# Patient Record
Sex: Female | Born: 1985 | Race: Black or African American | Hispanic: No | Marital: Married | Smoking: Never smoker
Health system: Southern US, Community
[De-identification: ages and names within clinical notes are randomized; demographics above are authoritative.]

## PROBLEM LIST (undated history)

## (undated) DIAGNOSIS — B999 Unspecified infectious disease: Secondary | ICD-10-CM

## (undated) DIAGNOSIS — A749 Chlamydial infection, unspecified: Secondary | ICD-10-CM

## (undated) DIAGNOSIS — D649 Anemia, unspecified: Secondary | ICD-10-CM

## (undated) DIAGNOSIS — L709 Acne, unspecified: Secondary | ICD-10-CM

## (undated) DIAGNOSIS — B009 Herpesviral infection, unspecified: Secondary | ICD-10-CM

## (undated) HISTORY — PX: DILATION AND CURETTAGE OF UTERUS: SHX78

---
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
## 2005-01-04 ENCOUNTER — Other Ambulatory Visit: Admission: RE | Admit: 2005-01-04 | Discharge: 2005-01-04 | Payer: Self-pay | Admitting: Obstetrics and Gynecology

## 2005-06-15 ENCOUNTER — Inpatient Hospital Stay (HOSPITAL_COMMUNITY): Admission: AD | Admit: 2005-06-15 | Discharge: 2005-06-16 | Payer: Self-pay | Admitting: Obstetrics and Gynecology

## 2005-06-16 ENCOUNTER — Inpatient Hospital Stay (HOSPITAL_COMMUNITY): Admission: AD | Admit: 2005-06-16 | Discharge: 2005-06-16 | Payer: Self-pay | Admitting: Obstetrics and Gynecology

## 2005-08-12 ENCOUNTER — Encounter (INDEPENDENT_AMBULATORY_CARE_PROVIDER_SITE_OTHER): Payer: Self-pay | Admitting: *Deleted

## 2005-08-12 ENCOUNTER — Inpatient Hospital Stay (HOSPITAL_COMMUNITY): Admission: AD | Admit: 2005-08-12 | Discharge: 2005-08-15 | Payer: Self-pay | Admitting: Obstetrics and Gynecology

## 2005-09-09 ENCOUNTER — Other Ambulatory Visit: Admission: RE | Admit: 2005-09-09 | Discharge: 2005-09-09 | Payer: Self-pay | Admitting: Obstetrics and Gynecology

## 2006-11-30 ENCOUNTER — Inpatient Hospital Stay (HOSPITAL_COMMUNITY): Admission: AD | Admit: 2006-11-30 | Discharge: 2006-11-30 | Payer: Self-pay | Admitting: Family Medicine

## 2006-12-16 ENCOUNTER — Ambulatory Visit (HOSPITAL_COMMUNITY): Admission: RE | Admit: 2006-12-16 | Discharge: 2006-12-16 | Payer: Self-pay | Admitting: Obstetrics

## 2007-02-20 ENCOUNTER — Inpatient Hospital Stay (HOSPITAL_COMMUNITY): Admission: AD | Admit: 2007-02-20 | Discharge: 2007-02-20 | Payer: Self-pay | Admitting: Obstetrics

## 2007-04-12 DIAGNOSIS — J45909 Unspecified asthma, uncomplicated: Secondary | ICD-10-CM

## 2007-04-24 ENCOUNTER — Inpatient Hospital Stay (HOSPITAL_COMMUNITY): Admission: AD | Admit: 2007-04-24 | Discharge: 2007-04-25 | Payer: Self-pay | Admitting: Obstetrics

## 2007-05-13 ENCOUNTER — Inpatient Hospital Stay (HOSPITAL_COMMUNITY): Admission: AD | Admit: 2007-05-13 | Discharge: 2007-05-13 | Payer: Self-pay | Admitting: Obstetrics & Gynecology

## 2007-05-16 ENCOUNTER — Inpatient Hospital Stay (HOSPITAL_COMMUNITY): Admission: AD | Admit: 2007-05-16 | Discharge: 2007-05-20 | Payer: Self-pay | Admitting: Obstetrics

## 2007-05-17 ENCOUNTER — Encounter: Payer: Self-pay | Admitting: Obstetrics

## 2007-11-06 ENCOUNTER — Emergency Department (HOSPITAL_COMMUNITY): Admission: EM | Admit: 2007-11-06 | Discharge: 2007-11-07 | Payer: Self-pay | Admitting: Emergency Medicine

## 2008-08-03 ENCOUNTER — Inpatient Hospital Stay (HOSPITAL_COMMUNITY): Admission: AD | Admit: 2008-08-03 | Discharge: 2008-08-03 | Payer: Self-pay | Admitting: Obstetrics & Gynecology

## 2009-03-06 ENCOUNTER — Emergency Department (HOSPITAL_COMMUNITY): Admission: EM | Admit: 2009-03-06 | Discharge: 2009-03-06 | Payer: Self-pay | Admitting: Emergency Medicine

## 2009-12-13 ENCOUNTER — Emergency Department (HOSPITAL_COMMUNITY): Admission: EM | Admit: 2009-12-13 | Discharge: 2009-12-14 | Payer: Self-pay | Admitting: Emergency Medicine

## 2009-12-15 ENCOUNTER — Emergency Department (HOSPITAL_COMMUNITY): Admission: EM | Admit: 2009-12-15 | Discharge: 2009-12-15 | Payer: Self-pay | Admitting: Family Medicine

## 2009-12-16 ENCOUNTER — Emergency Department (HOSPITAL_COMMUNITY): Admission: EM | Admit: 2009-12-16 | Discharge: 2009-12-16 | Payer: Self-pay | Admitting: Family Medicine

## 2009-12-17 ENCOUNTER — Emergency Department (HOSPITAL_COMMUNITY): Admission: EM | Admit: 2009-12-17 | Discharge: 2009-12-17 | Payer: Self-pay | Admitting: Family Medicine

## 2009-12-18 ENCOUNTER — Encounter (HOSPITAL_BASED_OUTPATIENT_CLINIC_OR_DEPARTMENT_OTHER): Admission: RE | Admit: 2009-12-18 | Discharge: 2010-02-04 | Payer: Self-pay | Admitting: Internal Medicine

## 2010-03-09 ENCOUNTER — Inpatient Hospital Stay (HOSPITAL_COMMUNITY): Admission: AD | Admit: 2010-03-09 | Discharge: 2010-03-09 | Payer: Self-pay | Admitting: Obstetrics & Gynecology

## 2010-06-05 ENCOUNTER — Inpatient Hospital Stay (HOSPITAL_COMMUNITY)
Admission: AD | Admit: 2010-06-05 | Discharge: 2010-06-05 | Payer: Self-pay | Source: Home / Self Care | Admitting: Obstetrics

## 2010-07-20 ENCOUNTER — Inpatient Hospital Stay (HOSPITAL_COMMUNITY)
Admission: AD | Admit: 2010-07-20 | Discharge: 2010-07-21 | Payer: Self-pay | Source: Home / Self Care | Attending: Obstetrics | Admitting: Obstetrics

## 2010-07-27 LAB — URINALYSIS, ROUTINE W REFLEX MICROSCOPIC
Bilirubin Urine: NEGATIVE
Hgb urine dipstick: NEGATIVE
Ketones, ur: >80 mg/dL — AB
Nitrite: NEGATIVE
Protein, ur: NEGATIVE mg/dL
Specific Gravity, Urine: 1.025 (ref 1.005–1.030)
Urine Glucose, Fasting: NEGATIVE mg/dL
Urobilinogen, UA: 0.2 mg/dL (ref 0.0–1.0)
pH: 6.5 (ref 5.0–8.0)

## 2010-09-22 LAB — URINALYSIS, ROUTINE W REFLEX MICROSCOPIC
Bilirubin Urine: NEGATIVE
Glucose, UA: NEGATIVE mg/dL
Hgb urine dipstick: NEGATIVE
Ketones, ur: NEGATIVE mg/dL
Nitrite: NEGATIVE
Protein, ur: NEGATIVE mg/dL
Specific Gravity, Urine: 1.01 (ref 1.005–1.030)
Urobilinogen, UA: 0.2 mg/dL (ref 0.0–1.0)
pH: 7.5 (ref 5.0–8.0)

## 2010-09-22 LAB — GC/CHLAMYDIA PROBE AMP, GENITAL
Chlamydia, DNA Probe: NEGATIVE
GC Probe Amp, Genital: NEGATIVE

## 2010-09-22 LAB — WET PREP, GENITAL
Trich, Wet Prep: NONE SEEN
Yeast Wet Prep HPF POC: NONE SEEN

## 2010-09-22 LAB — URINE MICROSCOPIC-ADD ON

## 2010-09-22 LAB — URINE CULTURE
Colony Count: 6000
Culture  Setup Time: 201111260116

## 2010-09-25 LAB — URINALYSIS, ROUTINE W REFLEX MICROSCOPIC
Bilirubin Urine: NEGATIVE
Glucose, UA: NEGATIVE mg/dL
Hgb urine dipstick: NEGATIVE
Ketones, ur: NEGATIVE mg/dL
Nitrite: NEGATIVE
Protein, ur: NEGATIVE mg/dL
Specific Gravity, Urine: 1.02 (ref 1.005–1.030)
Urobilinogen, UA: 0.2 mg/dL (ref 0.0–1.0)
pH: 7 (ref 5.0–8.0)

## 2010-09-25 LAB — WET PREP, GENITAL
Clue Cells Wet Prep HPF POC: NONE SEEN
Trich, Wet Prep: NONE SEEN

## 2010-09-25 LAB — GC/CHLAMYDIA PROBE AMP, GENITAL
Chlamydia, DNA Probe: NEGATIVE
GC Probe Amp, Genital: NEGATIVE

## 2010-09-25 LAB — CBC
HCT: 32 % — ABNORMAL LOW (ref 36.0–46.0)
Hemoglobin: 10.5 g/dL — ABNORMAL LOW (ref 12.0–15.0)
MCH: 27.5 pg (ref 26.0–34.0)
MCHC: 32.7 g/dL (ref 30.0–36.0)
MCV: 83.9 fL (ref 78.0–100.0)
Platelets: 251 10*3/uL (ref 150–400)
RBC: 3.81 MIL/uL — ABNORMAL LOW (ref 3.87–5.11)
RDW: 16.9 % — ABNORMAL HIGH (ref 11.5–15.5)
WBC: 5.9 10*3/uL (ref 4.0–10.5)

## 2010-09-25 LAB — HCG, QUANTITATIVE, PREGNANCY: hCG, Beta Chain, Quant, S: 2662 m[IU]/mL — ABNORMAL HIGH (ref <5)

## 2010-09-25 LAB — ABO/RH: ABO/RH(D): O POS

## 2010-09-25 LAB — POCT PREGNANCY, URINE: Preg Test, Ur: POSITIVE

## 2010-10-02 ENCOUNTER — Inpatient Hospital Stay (HOSPITAL_COMMUNITY)
Admission: AD | Admit: 2010-10-02 | Discharge: 2010-10-02 | Disposition: A | Discharge status: Home / Self Care | Payer: Medicaid Other | Source: Ambulatory Visit | Attending: Obstetrics | Admitting: Obstetrics

## 2010-10-02 DIAGNOSIS — Y92009 Unspecified place in unspecified non-institutional (private) residence as the place of occurrence of the external cause: Secondary | ICD-10-CM | POA: Insufficient documentation

## 2010-10-02 DIAGNOSIS — W010XXA Fall on same level from slipping, tripping and stumbling without subsequent striking against object, initial encounter: Secondary | ICD-10-CM | POA: Insufficient documentation

## 2010-10-02 DIAGNOSIS — O4700 False labor before 37 completed weeks of gestation, unspecified trimester: Secondary | ICD-10-CM | POA: Insufficient documentation

## 2010-10-17 LAB — CBC
HCT: 38.9 % (ref 36.0–46.0)
MCHC: 33.5 g/dL (ref 30.0–36.0)
MCV: 85.6 fL (ref 78.0–100.0)
Platelets: 245 10*3/uL (ref 150–400)
WBC: 4.5 10*3/uL (ref 4.0–10.5)

## 2010-10-17 LAB — POCT I-STAT, CHEM 8
Creatinine, Ser: 0.6 mg/dL (ref 0.4–1.2)
Glucose, Bld: 79 mg/dL (ref 70–99)
Hemoglobin: 14.3 g/dL (ref 12.0–15.0)
Sodium: 136 mEq/L (ref 135–145)
TCO2: 25 mmol/L (ref 0–100)

## 2010-10-17 LAB — DIFFERENTIAL
Eosinophils Absolute: 0 10*3/uL (ref 0.0–0.7)
Eosinophils Relative: 1 % (ref 0–5)
Lymphs Abs: 0.8 10*3/uL (ref 0.7–4.0)
Monocytes Relative: 8 % (ref 3–12)

## 2010-10-23 ENCOUNTER — Encounter (HOSPITAL_COMMUNITY)
Admission: RE | Admit: 2010-10-23 | Discharge: 2010-10-23 | Disposition: A | Discharge status: Home / Self Care | Payer: Medicaid Other | Source: Ambulatory Visit | Attending: Obstetrics | Admitting: Obstetrics

## 2010-10-23 LAB — CBC
HCT: 31.1 % — ABNORMAL LOW (ref 36.0–46.0)
MCHC: 30.2 g/dL (ref 30.0–36.0)
MCV: 80.6 fL (ref 78.0–100.0)
RDW: 20.2 % — ABNORMAL HIGH (ref 11.5–15.5)

## 2010-10-26 ENCOUNTER — Inpatient Hospital Stay (HOSPITAL_COMMUNITY)
Admission: RE | Admit: 2010-10-26 | Discharge: 2010-10-29 | DRG: 766 | Disposition: A | Discharge status: Home / Self Care | Payer: Medicaid Other | Source: Ambulatory Visit | Attending: Obstetrics | Admitting: Obstetrics

## 2010-10-26 ENCOUNTER — Other Ambulatory Visit: Payer: Self-pay | Admitting: Obstetrics

## 2010-10-26 DIAGNOSIS — Z01818 Encounter for other preprocedural examination: Secondary | ICD-10-CM

## 2010-10-26 DIAGNOSIS — Z01812 Encounter for preprocedural laboratory examination: Secondary | ICD-10-CM

## 2010-10-26 DIAGNOSIS — O34219 Maternal care for unspecified type scar from previous cesarean delivery: Principal | ICD-10-CM | POA: Diagnosis present

## 2010-10-26 DIAGNOSIS — O9903 Anemia complicating the puerperium: Secondary | ICD-10-CM | POA: Diagnosis not present

## 2010-10-26 DIAGNOSIS — D649 Anemia, unspecified: Secondary | ICD-10-CM | POA: Diagnosis not present

## 2010-10-26 LAB — HERPES SIMPLEX VIRUS CULTURE

## 2010-10-26 LAB — TYPE AND SCREEN

## 2010-10-26 LAB — URINALYSIS, ROUTINE W REFLEX MICROSCOPIC
Nitrite: NEGATIVE
Specific Gravity, Urine: 1.025 (ref 1.005–1.030)
Urobilinogen, UA: 0.2 mg/dL (ref 0.0–1.0)

## 2010-10-26 LAB — POCT PREGNANCY, URINE: Preg Test, Ur: NEGATIVE

## 2010-10-27 LAB — CBC
HCT: 25.6 % — ABNORMAL LOW (ref 36.0–46.0)
MCHC: 30.9 g/dL (ref 30.0–36.0)
MCV: 80.5 fL (ref 78.0–100.0)
RDW: 20.2 % — ABNORMAL HIGH (ref 11.5–15.5)
WBC: 7.1 10*3/uL (ref 4.0–10.5)

## 2010-10-29 NOTE — Op Note (Signed)
[name], [name]              ACCOUNT NO.:  [id number]  MEDICAL RECORD NO.:  [id number]           PATIENT TYPE:  I  LOCATION:  9127                          [facility]ILITY[facility]  [facility]  PHYSICIAN:  Charles A. Clearance Coots, M.D.DATE OF BIRTH:  [birth year]  DATE OF PROCEDURE:  10/26/2010 DATE OF DISCHARGE:                              OPERATIVE REPORT   PREOPERATIVE DIAGNOSIS:  Previous cesarean section, desire repeat cesarean section.  POSTOPERATIVE DIAGNOSIS:  Previous cesarean section, desire repeat cesarean section.  PROCEDURE:  Repeat low-transverse cesarean section.  SURGEON:  Charles A. Clearance Coots, MD  ASSISTANT:  Roseanna Rainbow, MD  ANESTHESIA:  Spinal.  ESTIMATED BLOOD LOSS:  600 mL.  IV FLUIDS:  2200 mL.  URINE OUTPUT:  250 mL clear.  COMPLICATIONS:  None.  Foley to gravity.  FINDINGS:  Viable female at 11:30, Apgars of 8 at one minute and 9 at five minutes, weight of 6 pounds and 13 ounces.  Normal uterus, ovaries,and fallopian tubes.  SPECIMENS:  Placenta.  DISPOSITION:  Specimen to Pathology.  OPERATION:  The patient was brought to the operating room and after satisfactory spinal anesthesia, the abdomen was prepped and draped in the usual sterile fashion.  A Pfannenstiel skin incision was made with a scalpel through the previous scar down to the fascia.  The fascia nicked in the midline.  The fascial incision was extended to left to right with curved Mayo scissors.  The superior and inferior fascial edges were taken off the rectus muscles both blunt and sharp dissection.  The rectus muscles bluntly divided in the midline and peritoneum was entered digitally and was digitally extended to left and to the right.  The Alexis retractor was placed in the incision.  The vesicouterine fold of peritoneum above the reflection of the urinary bladder was grasped with forceps and was incised and undermined with Metzenbaum scissors.  The incision was extended to left then to  the right with Metzenbaum scissors.  The bladder flap was bluntly developed.  The uterus was then entered transversely and the lower uterine segment with a scalpel. Clear amniotic fluid was expelled.  The uterine incision was extended to the left then to the right with bandage scissors.  The vertex was noted to be left occiput transverse.  Occiput was rotated anteriorly into the incision and the delivery of the vertex was accomplished with the aid of fundal pressure from the assistant.  The delivery of the shoulders and remainder of the body was completed with the aid of fundal pressure from the assistant.  The umbilical cord was doubly clamped and cut and the infant was handed off to the nursery staff.  Cord blood was obtained, the placenta was spontaneously expelled from the uterine cavity intact. The endometrial surface was thoroughly debrided with a dry lap sponge. The edges of the uterine incision were grasped with ring forceps. Uterus was closed with a continuous interlocking suture of the 0- Monocryl.  Hemostasis was excellent.  The pelvic cavity was then thoroughly irrigated with warm saline solution and all clots were removed.  The abdomen was then closed as follows.  The parietal peritoneum was  closed with continuous suture of 2-0 Monocryl.  The fascia was closed with continuous suture of 0-Vicryl subcutaneous tissue was thoroughly irrigated with warm saline solution and all areas of subcutaneous bleeding were coagulated with Bovie.  The skin was then closed with surgical stainless steel staples.  Sterile bandage was applied to the incision closure.  Surgical technician indicated that all needle, sponge, and instrument counts were correct x2.  The patient tolerated the procedure well, transported to recovery room in a satisfactory condition.     Charles A. Clearance Coots, M.D.     CAH/MEDQ  D:  10/26/2010  T:  10/27/2010  Job:  [id number]  Electronically Signed by Coral Ceo  M.D. on 10/29/2010 02:30:47 PM

## 2010-10-29 NOTE — Discharge Summary (Signed)
  [name], [name]              ACCOUNT NO.:  [id number]  MEDICAL RECORD NO.:  [id number]           PATIENT TYPE:  I  LOCATION:  9127                          [facility]  PHYSICIAN:  [name], M.D.DATE OF BIRTH:  1985/12/13  DATE OF ADMISSION:  10/26/2010 DATE OF DISCHARGE:                              DISCHARGE SUMMARY   ADMITTING DIAGNOSIS:  Previous cesarean section at term, desires repeat cesarean section.  DISCHARGE DIAGNOSIS:  Previous cesarean section at term, desires repeat cesarean section, status post repeat low-transverse cesarean section on October 26, 2010.  Viable female delivered at 11:30, Apgars of 8 at 1 minute and 9 at 5 minutes, weight of 6 pounds and 3 ounces.  Mother and infant discharged home in good condition.  REASON FOR ADMISSION:  A 25 year old G3 P2, estimated date of confinement on November 02, 2010, history of two previous cesarean section, desired repeat cesarean section.  PAST MEDICAL HISTORY:  Surgery, cesarean section x2.  ILLNESSES:  Anemia.  MEDICATIONS:  Prenatal vitamins.  ALLERGIES:  No known drug allergies.  SOCIAL HISTORY:  Single.  Negative tobacco, alcohol or recreational drug use.  FAMILY HISTORY:  Cervical cancer.  PHYSICAL EXAMINATION:  GENERAL:  Well-nourished and well-developed female in no acute distress. VITAL SIGNS:  Afebrile.  Vital signs are stable. LUNGS:  Clear to auscultation bilaterally. HEART:  Regular rate and rhythm. ABDOMEN:  Gravid, nontender.  Cervical exam omitted.  ADMITTING LABS:  Hemoglobin 9.4, hematocrit 31.1, white blood cell count 6400, platelets 258,000.  RPR is nonreactive.  HOSPITAL COURSE:  The patient underwent repeat low-transverse cesarean section on October 26, 2010.  There were no intraoperative complications. Postoperative course was uncomplicated.  The patient did have an anemia postoperatively, but she was hemodynamically stable and had no orthostatic changes and was able to  ambulate without dizziness, headaches or weakness.  She was therefore started on an iron therapy upon discharge.  The patient was discharged home on postop day #3 in good condition.  DISCHARGE LABS:  Hemoglobin 7.9, hematocrit 25.6, white blood cell count 7100, platelets 214,000.  DISCHARGE DISPOSITION:  Medications; continue prenatal vitamins, iron was prescribed for anemia, Percocet and ibuprofen was prescribed for pain.  Routine instructions were given for discharge after cesarean section.  The patient is to call our office for follow up appointment in 2 weeks.     Maevyn Riordan A. Clearance Willis, M.D.     CAH/MEDQ  D:  10/29/2010  T:  10/29/2010  Job:  [id number]  Electronically Signed by Coral Ceo M.D. on 10/29/2010 02:30:49 PM

## 2010-10-30 ENCOUNTER — Inpatient Hospital Stay (HOSPITAL_COMMUNITY)
Admission: AD | Admit: 2010-10-30 | Discharge: 2010-10-30 | Disposition: A | Discharge status: Home / Self Care | Payer: Medicaid Other | Source: Ambulatory Visit | Attending: Obstetrics & Gynecology | Admitting: Obstetrics & Gynecology

## 2010-10-30 DIAGNOSIS — O909 Complication of the puerperium, unspecified: Secondary | ICD-10-CM

## 2010-11-24 NOTE — Op Note (Signed)
[name], [name]              ACCOUNT NO.:  [id number]   MEDICAL RECORD NO.:  [id number]          PATIENT TYPE:  INP   LOCATION:  9119                          [facility]ILITY[facility]  [facility]   PHYSICIAN:  Charles A. Clearance Coots, M.D.DATE OF BIRTH:  04/17/1986   DATE OF PROCEDURE:  05/17/2007  DATE OF DISCHARGE:                               OPERATIVE REPORT   PREOPERATIVE DIAGNOSES:  1. 41 weeks' gestation.  2. Previous cesarean section.  3. Induction of labor.  4. Repetitive late onset variable decelerations.  5. Very unfavorable cervix.   POSTOPERATIVE DIAGNOSES:  1. 41 weeks' gestation.  2. Previous cesarean section.  3. Induction of labor.  4. Repetitive late onset variable decelerations.  5. Very unfavorable cervix.   PROCEDURE:  Repeat low transverse cesarean section.   SURGEON:  Charles A. Clearance Coots, M.D.   ANESTHESIA:  Spinal.   ESTIMATED BLOOD LOSS:  700 mL   COMPLICATIONS:  None.   DRAINS:  Foley to gravity.   FINDINGS:  Viable female at 0817h; Apgars at 8 at one minute and 9 at 5  minutes.  Weight 7 pounds 7 ounces.  Normal uterus, ovaries and  fallopian tubes.   OPERATION:  The patient was brought to the operating room and after  satisfactory spinal anesthesia, the abdomen was prepped and draped in  the usual sterile fashion.  A Pfannenstiel skin incision was made with  the scalpel and was deepened down to the fascia with a scalpel.  The  fascia was nicked in the midline and the fascial incision was extended  to left and to right with curved Mayo scissors.  The superior and  inferior fascial edges were taken off the rectus muscle both with blunt  and sharp dissection.  Rectus muscle was divided in the midline and the  peritoneum was entered digitally; was digitally extended to left and to  the right.  The bladder blade was positioned and the vesicouterine fold  of the peritoneum above the reflection of urinary bladder was grasped  with forceps; was then incised and  undermined with Metzenbaum scissors.  The incision was extended to the left and to right with Metzenbaum  scissors.  The bladder flap was bluntly developed and the bladder blade  was repositioned in front of the urinary bladder,  placing it well out  of the operative field.  The uterus was then entered transversely in the  lower uterine segment with the scalpel.  Clear amniotic fluid was  expelled.  The uterine incision was extended to the left and to the  right digitally.  The vertex was hyperextended, and it was difficult to  deliver with the aid of fundal pressure from the assistant.  The vacuum  extractor was placed on the occiput and the delivery was accomplished  with one pull of the vacuum extractor; the vertex was then delivered  quite easily.  The infant's mouth and nose were suctioned with a suction  bulb, and delivery was completed with the aid of fundal pressure from  the assistant.  The umbilical cord was doubly clamped and cut, and the  infant was handed  off to the nursery staff.  The placenta was then  manually removed from the uterus intact.  The endometrial surface was  then thoroughly debrided with a dry lap sponge.  Edges of the uterine  incision were grasped with the ring forceps.  The uterus was closed with  a continuous interlocking suture of 0 Monocryl, from each corner to the  center.  Hemostasis was excellent.  The pelvic cavity was then  thoroughly irrigated with warm saline solution.   The abdomen was then closed as follows.  The peritoneum was closed with  continuous suture of 2-0 Monocryl.  The fascia was closed with a  continuous suture of 0 Monocryl.  Subcutaneous tissue was thoroughly  irrigated with warm saline solution, and all areas of subcutaneous  bleeding were coagulated with the Bovie.  The skin was then closed with  continuous subcuticular suture of 3-0 Monocryl.  A sterile bandage was  applied to the incision closure.  The surgical technician  indicated that  all needle, sponge and instrument counts were correct x2.  The patient  tolerated the procedure well and was transported to the recovery room in  satisfactory condition.      Charles A. Clearance Coots, M.D.  Electronically Signed     CAH/MEDQ  D:  05/17/2007  T:  05/17/2007  Job:  [id number]

## 2010-11-27 NOTE — Discharge Summary (Signed)
[name], [name]              ACCOUNT NO.:  [id number]   MEDICAL RECORD NO.:  [id number]          PATIENT TYPE:  INP   LOCATION:  9124                          [facility]ILITY:  [facility]   PHYSICIAN:  James A. Ashley Royalty, M.D.DATE OF BIRTH:  1986/06/07   DATE OF ADMISSION:  08/12/2005  DATE OF DISCHARGE:  08/15/2005                                 DISCHARGE SUMMARY   DISCHARGE DIAGNOSES:  1.  Intrauterine pregnancy at [redacted] weeks gestation.  2.  Labor.  3.  Group B strep positive.  4.  Non reassuring fetal heart tracing, in labor.   OPERATIONS/PROCEDURES:  Primary low transverse cesarean section.   CONSULTATIONS:  None.   DISCHARGE MEDICATIONS:  Percocet, Motrin, vitamins.   HISTORY AND PHYSICAL EXAMINATION:  This is an 25 year old primigravida at 29-  weeks, 6-days gestation. Patient presented the day of admission complaining  of contractions. Fetal heart rate baseline was approximately 110 beats per  minute and maternity admissions. Initial cervical examination revealed the  cervix to be loose, finger tip dilatation, 80% effaced, -2 station, vertex  presentation. For the remainder of the history and physical please see  chart.   HOSPITAL COURSE:  The patient was admitted to [facility].  Admission laboratory studies were drawn. She was allowed to labor. On  August 12, 2005 at approximately 12:10 p.m. I was called to evaluate  possible FHR abnormalities. After thorough review the decision was made to  proceed with primary low transverse cesarean section for non reassuring  fetal heart tracing. The patient was taken to the operating room on August 12, 2005 and underwent primary low transverse cesarean section. The procedure  yielded a 6 pound, 13 ounce female with Apgar's 9 at 1 minute and 9 at 5  minutes. Transferred to the newborn nursery. Arterial cord pH was 7.30. The  patient's postpartum course was benign. She was discharged on the third  postpartum day afebrile  and in satisfactory condition.   DISPOSITION:  The patient is to return to [facility] in  4-6 weeks for postpartum evaluation.      James A. Ashley Royalty, M.D.  Electronically Signed     JAM/MEDQ  D:  09/08/2005  T:  09/08/2005  Job:  [id number]

## 2010-11-27 NOTE — Op Note (Signed)
[name], [name]              ACCOUNT NO.:  [id number]   MEDICAL RECORD NO.:  [id number]          PATIENT TYPE:  INP   LOCATION:  9124                          [facility]   PHYSICIAN:  James A. Ashley Royalty, M.D.DATE OF BIRTH:  Jul 24, 1985   DATE OF PROCEDURE:  08/12/2005  DATE OF DISCHARGE:                                 OPERATIVE REPORT   PREOPERATIVE DIAGNOSES:  1.  Intrauterine pregnancy at 41 weeks' gestation.  2.  Nonreassuring fetal heart tracing.   POSTOPERATIVE DIAGNOSES:  1.  Intrauterine pregnancy at 41 weeks' gestation.  2.  Nonreassuring fetal heart tracing.   PROCEDURE:  Primary low transverse cesarean section.   SURGEON:  Rudy Jew. Ashley Royalty, M.D.   ANESTHESIA:  Spinal.   FINDINGS:  A 6 pound 13 ounce female, Apgars 9 at one minute and 9 at five  minutes, sent to the newborn nursery.  Arterial cord pH 7.30.   ESTIMATED BLOOD LOSS:  800 mL.   COMPLICATIONS:  None immediate.   PACKS AND DRAINS:  Foley.   Sponge, needle and instrument count were reported as correct x2.   PROCEDURE:  The patient was taken to the operating room and placed in the  sitting position.  After a spinal anesthetic was administered, she was  placed in the dorsal supine position and prepped and draped in the usual  manner for abdominal surgery.  After she was prepped and Foley catheter  placed, the fetal scalp lead was removed and draping was completed.   A Pfannenstiel incision was made down to the level of the fascia, which was  nicked with a knife and incised transversely with Mayo scissors.  The  underlying rectus muscles were separated from the overlying fascia using  sharp and blunt dissection.  The peritoneum was elevated and entered  atraumatically with Metzenbaum scissors.  The incision was extended  longitudinally.  The uterus was identified and a bladder flap created by  incising the anterior uterine serosa and sharply and bluntly dissecting the  bladder inferiorly.  The  uterus was then entered through a low transverse  incision using sharp and blunt dissection.  The fluid was noted to be clear.  The infant was delivered from a vertex presentation in an atraumatic manner.  At delivery of the head, the oropharynx and nasal pharynx were suctioned.  Full delivery was accomplished.  The umbilical cord was triply clamped, cut,  and the infant given immediately to the awaiting pediatrics team.  Arterial  cord pH was obtained with an isolated segment of cord.  Then regular cord  blood was obtained.  The placenta and membranes were removed in their  entirety and submitted to pathology for histologic studies.  The uterus was  exteriorized.   The uterus was then closed in two running layers of #1 Vicryl.  The first  was a running, locking layer.  The second was a running, intermittently  locking, and imbricating layer.  One additional figure-of-eight suture was  required to obtain hemostasis.  Hemostasis was noted.  The uterus, tubes and  ovaries were inspected and found to be normal and  were returned to the  abdominal cavity.  Copious irrigation was accomplished.  Hemostasis was  noted.  The peritoneum was then closed with 3-0 Vicryl in a running fashion.  The fascia was closed 0 Vicryl in a running fashion.  The skin was closed  with staples.  The patient tolerated the procedure extremely well and was  returned to the recovery room in good condition.      James A. Ashley Royalty, M.D.  Electronically Signed     JAM/MEDQ  D:  08/12/2005  T:  08/12/2005  Job:  [id number]

## 2010-11-27 NOTE — Discharge Summary (Signed)
[name], [name]              ACCOUNT NO.:  [id number]   MEDICAL RECORD NO.:  [id number]          PATIENT TYPE:  INP   LOCATION:  9119                          [facility]   PHYSICIAN:  Charles A. Clearance Coots, M.D.DATE OF BIRTH:  1985-07-24   DATE OF ADMISSION:  05/16/2007  DATE OF DISCHARGE:  05/20/2007                               DISCHARGE SUMMARY   ADMISSION DIAGNOSES:  1. A [redacted] weeks gestation.  2. Induction of labor.  3. Previous cesarean section.  4. Unfavorable cervix.   DISCHARGE DIAGNOSES:  1. A [redacted] weeks gestation.  2. Induction of labor.  3. Previous cesarean section.  4. Unfavorable cervix.  5. Status post repeat low transverse cesarean section for      nonreassuring fetal heart rate with repetitive late onset of      variables, very unfavorable cervix.  Viable female was delivered by      repeat cesarean section on May 17, 2007 at 0817.  Apgars of 8      at 1 minute, 9 at 5 minutes, weight of 3395 grams, length of 51.5      cm.  Mother and infant discharged home in good condition.   REASON FOR ADMISSION:  A 25 year old G2, P50, estimated date of  confinement of May 07, 2007.  History of previous cesarean section  for nonreassuring fetal heart rate.  The patient desired trial of labor.  She was admitted to the hospital for ripening of the cervix with Foley  bulb because of the unfavorable nature of her cervix.   PAST MEDICAL HISTORY:  Surgery:  Cesarean section.  Illnesses:  None.   MEDICATIONS:  Prenatal vitamins.   ALLERGIES:  NO KNOWN DRUG ALLERGIES.   SOCIAL HISTORY:  Negative for tobacco, alcohol or recreational drug use.  Single, homemaker.   FAMILY HISTORY:  Positive for diabetes and cervical cancer.   PHYSICAL EXAMINATION:  GENERAL:  Well-nourished, well-developed female  in no acute distress.  VITAL SIGNS:  Stable.  She is afebrile.  LUNGS:  Clear to auscultation bilaterally.  HEART:  Regular rate and rhythm.  ABDOMEN:  Gravid,  nontender.  Cervix long, closed and vertex at -3  station.   LABORATORY DATA:  Hemoglobin 8.6, hematocrit 26.9, white blood cell  count 9400, platelets 332,000.  RPR was nonreactive.  Discharge  laboratory values:  Hemoglobin 7.8, hematocrit 24, white blood cell  count 8100, platelets 272,000.   HOSPITAL COURSE:  The patient was admitted and attempt was made to place  Foley bulb within the cervical canal for ripening of the cervix, but was  unsuccessful.  A decision was made to proceed with induction of labor.  Cervical ripening with low-dose Pitocin.  The patient after starting  Pitocin proceeded to have uterine contractions every 3-5 minutes with  late onset variable fetal heart rate decelerations.  Cervical exam was  essentially unchanged.  The late onset variable fetal heart rate  decelerations continued despite IV fluid hydration, position change and  oxygen.  The decision was made to proceed with repeat cesarean section  for nonreassuring fetal heart rate with a very unfavorable  cervix with  repetitive late onset variable fetal heart rate decelerations.  Repeat  low transverse cesarean section was performed on May 17, 2007.  There were no intraoperative complications.  Postop course was  uncomplicated.  The patient was discharged home on postop day 3 in good  condition.   The patient did have anemia on discharge, but her baseline hemoglobin  also indicated anemia with not a significant change in the hemoglobin of  only 1 gram after surgery.  The patient clinically and hemodynamically  is very stable with no orthostatic changes or headache, dizziness.  She  was therefore discharged home on iron therapy for her severe anemia.   DISCHARGE MEDICATIONS:  1. Continue prenatal vitamins.  2. Iron was prescribed for anemia.  3. Tylox and ibuprofen were prescribed for pain.   DISCHARGE INSTRUCTIONS:  1. Routine written instructions were given for discharge after      cesarean  section.  2. The patient is to call the office for a followup appointment in 2      weeks.      Charles A. Clearance Coots, M.D.  Electronically Signed     CAH/MEDQ  D:  06/16/2007  T:  06/17/2007  Job:  [id number]

## 2010-12-20 ENCOUNTER — Emergency Department (HOSPITAL_COMMUNITY)
Admission: EM | Admit: 2010-12-20 | Discharge: 2010-12-20 | Disposition: A | Discharge status: Home / Self Care | Payer: Medicaid Other | Attending: Emergency Medicine | Admitting: Emergency Medicine

## 2010-12-20 DIAGNOSIS — B86 Scabies: Secondary | ICD-10-CM | POA: Insufficient documentation

## 2010-12-20 DIAGNOSIS — L2989 Other pruritus: Secondary | ICD-10-CM | POA: Insufficient documentation

## 2010-12-20 DIAGNOSIS — R21 Rash and other nonspecific skin eruption: Secondary | ICD-10-CM | POA: Insufficient documentation

## 2010-12-20 DIAGNOSIS — L298 Other pruritus: Secondary | ICD-10-CM | POA: Insufficient documentation

## 2010-12-22 ENCOUNTER — Emergency Department (HOSPITAL_COMMUNITY)
Admission: EM | Admit: 2010-12-22 | Discharge: 2010-12-22 | Disposition: A | Discharge status: Home / Self Care | Payer: Medicaid Other | Attending: Emergency Medicine | Admitting: Emergency Medicine

## 2010-12-22 DIAGNOSIS — L509 Urticaria, unspecified: Secondary | ICD-10-CM | POA: Insufficient documentation

## 2010-12-22 DIAGNOSIS — L299 Pruritus, unspecified: Secondary | ICD-10-CM | POA: Insufficient documentation

## 2011-03-18 ENCOUNTER — Emergency Department (HOSPITAL_COMMUNITY)
Admission: EM | Admit: 2011-03-18 | Discharge: 2011-03-19 | Disposition: A | Discharge status: Home / Self Care | Payer: Medicaid Other | Attending: Emergency Medicine | Admitting: Emergency Medicine

## 2011-03-18 DIAGNOSIS — N760 Acute vaginitis: Secondary | ICD-10-CM | POA: Insufficient documentation

## 2011-03-18 DIAGNOSIS — R109 Unspecified abdominal pain: Secondary | ICD-10-CM | POA: Insufficient documentation

## 2011-03-18 DIAGNOSIS — B9689 Other specified bacterial agents as the cause of diseases classified elsewhere: Secondary | ICD-10-CM | POA: Insufficient documentation

## 2011-03-18 DIAGNOSIS — A499 Bacterial infection, unspecified: Secondary | ICD-10-CM | POA: Insufficient documentation

## 2011-03-18 DIAGNOSIS — R112 Nausea with vomiting, unspecified: Secondary | ICD-10-CM | POA: Insufficient documentation

## 2011-03-18 DIAGNOSIS — R10819 Abdominal tenderness, unspecified site: Secondary | ICD-10-CM | POA: Insufficient documentation

## 2011-03-18 LAB — DIFFERENTIAL
Eosinophils Absolute: 0.1 10*3/uL (ref 0.0–0.7)
Lymphocytes Relative: 27 % (ref 12–46)
Lymphs Abs: 1.8 10*3/uL (ref 0.7–4.0)
Monocytes Relative: 6 % (ref 3–12)
Neutro Abs: 4.4 10*3/uL (ref 1.7–7.7)
Neutrophils Relative %: 66 % (ref 43–77)

## 2011-03-18 LAB — CBC
HCT: 38.3 % (ref 36.0–46.0)
Hemoglobin: 12.7 g/dL (ref 12.0–15.0)
MCH: 27.4 pg (ref 26.0–34.0)
MCV: 82.7 fL (ref 78.0–100.0)
RBC: 4.63 MIL/uL (ref 3.87–5.11)

## 2011-03-18 LAB — COMPREHENSIVE METABOLIC PANEL
BUN: 8 mg/dL (ref 6–23)
CO2: 27 mEq/L (ref 19–32)
Calcium: 10.1 mg/dL (ref 8.4–10.5)
Chloride: 102 mEq/L (ref 96–112)
Creatinine, Ser: 0.63 mg/dL (ref 0.50–1.10)
GFR calc Af Amer: >60 mL/min (ref >60)
GFR calc non Af Amer: >60 mL/min (ref >60)
Glucose, Bld: 100 mg/dL — ABNORMAL HIGH (ref 70–99)
Total Bilirubin: 0.2 mg/dL — ABNORMAL LOW (ref 0.3–1.2)

## 2011-03-18 LAB — URINALYSIS, ROUTINE W REFLEX MICROSCOPIC
Glucose, UA: NEGATIVE mg/dL
Hgb urine dipstick: NEGATIVE
Protein, ur: NEGATIVE mg/dL
pH: 7 (ref 5.0–8.0)

## 2011-03-19 LAB — WET PREP, GENITAL
Trich, Wet Prep: NONE SEEN
Yeast Wet Prep HPF POC: NONE SEEN

## 2011-04-06 LAB — URINE MICROSCOPIC-ADD ON

## 2011-04-06 LAB — URINALYSIS, ROUTINE W REFLEX MICROSCOPIC
Bilirubin Urine: NEGATIVE
Glucose, UA: NEGATIVE
Hgb urine dipstick: NEGATIVE
Specific Gravity, Urine: 1.026
pH: 6

## 2011-04-20 LAB — CBC
Hemoglobin: 7.8 — CL
MCHC: 32.5
Platelets: 332
RBC: 3.16 — ABNORMAL LOW
RDW: 19.3 — ABNORMAL HIGH
WBC: 9.4

## 2011-04-20 LAB — RPR: RPR Ser Ql: NONREACTIVE

## 2011-04-20 LAB — CCBB MATERNAL DONOR DRAW

## 2011-04-26 LAB — CBC
HCT: 25.5 — ABNORMAL LOW
Hemoglobin: 8.6 — ABNORMAL LOW
Platelets: 229
RDW: 13.4
WBC: 11.8 — ABNORMAL HIGH

## 2011-04-26 LAB — COMPREHENSIVE METABOLIC PANEL
ALT: 8
AST: 14
Albumin: 2.4 — ABNORMAL LOW
Alkaline Phosphatase: 64
Chloride: 102
Potassium: 2.9 — ABNORMAL LOW
Sodium: 131 — ABNORMAL LOW
Total Bilirubin: 0.6
Total Protein: 5.9 — ABNORMAL LOW

## 2011-04-26 LAB — DIFFERENTIAL
Basophils Absolute: 0
Basophils Relative: 0
Eosinophils Absolute: 0
Eosinophils Relative: 0
Monocytes Absolute: 1.2 — ABNORMAL HIGH
Monocytes Relative: 10

## 2011-04-26 LAB — URINALYSIS, ROUTINE W REFLEX MICROSCOPIC
Glucose, UA: NEGATIVE
Ketones, ur: NEGATIVE
Nitrite: NEGATIVE
Protein, ur: NEGATIVE
pH: 6.5

## 2012-02-29 ENCOUNTER — Inpatient Hospital Stay (HOSPITAL_COMMUNITY)
Admission: AD | Admit: 2012-02-29 | Discharge: 2012-02-29 | Disposition: A | Discharge status: Home / Self Care | Payer: Medicaid Other | Source: Ambulatory Visit | Attending: Obstetrics | Admitting: Obstetrics

## 2012-02-29 ENCOUNTER — Encounter (HOSPITAL_COMMUNITY): Payer: Self-pay

## 2012-02-29 DIAGNOSIS — N949 Unspecified condition associated with female genital organs and menstrual cycle: Secondary | ICD-10-CM

## 2012-02-29 DIAGNOSIS — N898 Other specified noninflammatory disorders of vagina: Secondary | ICD-10-CM

## 2012-02-29 LAB — URINALYSIS, ROUTINE W REFLEX MICROSCOPIC
Bilirubin Urine: NEGATIVE
Ketones, ur: NEGATIVE mg/dL
Nitrite: NEGATIVE
Protein, ur: NEGATIVE mg/dL
Specific Gravity, Urine: 1.025 (ref 1.005–1.030)
Urobilinogen, UA: 0.2 mg/dL (ref 0.0–1.0)

## 2012-02-29 LAB — WET PREP, GENITAL: Yeast Wet Prep HPF POC: NONE SEEN

## 2012-02-29 LAB — URINE MICROSCOPIC-ADD ON

## 2012-02-29 NOTE — MAU Note (Signed)
Patient states 3 weeks ago she terminated a 7 week pregnancy in [place] with a D & E. Has been having cramping with vaginal discharge with an odor. Has pain in lower back on and off.

## 2012-02-29 NOTE — MAU Provider Note (Signed)
[name] [id number]  No chief complaint on file.    First Provider Initiated Contact with Patient 02/29/12 1659      SUBJECTIVE  HPI: 3 wks s/p elective Ab at [redacted] wk GA done in [place] on July 23. The D&E procedure was uncomplicated and she took antibiotics. Her bleeding stopped completely after 2 weeks. She is currently using the Nuva Ring due to come out in 5 days. She has been sexually active in the last week. She is now concerned that she has menstrual-like cramping and malodorous discharge.  Past Medical History  Diagnosis Date  . No pertinent past medical history    Past Surgical History  Procedure Date  . Cesarean section   . Dilation and curettage of uterus    History   Social History  . Marital Status: Single    Spouse Name: N/A    Number of Children: N/A  . Years of Education: N/A   Occupational History  . Not on file.   Social History Main Topics  . Smoking status: Never Smoker   . Smokeless tobacco: Not on file  . Alcohol Use:   . Drug Use: No  . Sexually Active: Yes    Birth Control/ Protection: Inserts     nuvaring   Other Topics Concern  . Not on file   Social History Narrative  . No narrative on file   No current facility-administered medications on file prior to encounter.   No current outpatient prescriptions on file prior to encounter.   No Known Allergies  ROS: Pertinent items in HPI  OBJECTIVE Blood pressure 118/82, pulse 72, temperature 98.2 F (36.8 C), temperature source Oral, resp. rate 16, height 5\' 4"  (1.626 m), weight 94.62 kg (208 lb 9.6 oz), last menstrual period 01/11/2012, SpO2 100.00%.  GENERAL: Well-developed, well-nourished female in no acute distress.  HEENT: Normocephalic, good dentition HEART: normal rate RESP: normal effort ABDOMEN: Soft, nontender EXTREMITIES: Nontender, no edema NEURO: Alert and oriented SPECULUM EXAM: NEFG, scant creamy white discharge, no blood noted, cervix clean, Nuva Ring in  place BIMANUAL: cervix clean, os closed, mobile; uterus NT andNSSP; no adnexal tenderness or masses   LAB RESULTS  Results for orders placed during the hospital encounter of 02/29/12 (from the past 24 hour(s))  URINALYSIS, ROUTINE W REFLEX MICROSCOPIC     Status: Abnormal   Collection Time   02/29/12  4:15 PM      Component Value Range   Color, Urine YELLOW  YELLOW   APPearance CLEAR  CLEAR   Specific Gravity, Urine 1.025  1.005 - 1.030   pH 6.0  5.0 - 8.0   Glucose, UA NEGATIVE  NEGATIVE mg/dL   Hgb urine dipstick TRACE (*) NEGATIVE   Bilirubin Urine NEGATIVE  NEGATIVE   Ketones, ur NEGATIVE  NEGATIVE mg/dL   Protein, ur NEGATIVE  NEGATIVE mg/dL   Urobilinogen, UA 0.2  0.0 - 1.0 mg/dL   Nitrite NEGATIVE  NEGATIVE   Leukocytes, UA NEGATIVE  NEGATIVE  URINE MICROSCOPIC-ADD ON     Status: Abnormal   Collection Time   02/29/12  4:15 PM      Component Value Range   Squamous Epithelial / LPF FEW (*) RARE   WBC, UA 0-2  <3 WBC/hpf   RBC / HPF 0-2  <3 RBC/hpf   Bacteria, UA FEW (*) RARE   Urine-Other MUCOUS PRESENT    POCT PREGNANCY, URINE     Status: Normal   Collection Time   02/29/12  5:06  PM      Component Value Range   Preg Test, Ur NEGATIVE  NEGATIVE  WET PREP, GENITAL     Status: Abnormal   Collection Time   02/29/12  5:15 PM      Component Value Range   Yeast Wet Prep HPF POC NONE SEEN  NONE SEEN   Trich, Wet Prep NONE SEEN  NONE SEEN   Clue Cells Wet Prep HPF POC NONE SEEN  NONE SEEN   WBC, Wet Prep HPF POC FEW (*) NONE SEEN       ASSESSMENT  1. Vaginal Discharge     PLAN  Medication List    Notice       You have not been prescribed any medications.           Reassured re negative WP GC/CT sent - will call and tx if positive Vaginitis prevention discussed       Davan Hark 02/29/2012 5:01 PM

## 2012-03-01 LAB — GC/CHLAMYDIA PROBE AMP, GENITAL
Chlamydia, DNA Probe: NEGATIVE
GC Probe Amp, Genital: NEGATIVE

## 2012-03-26 ENCOUNTER — Inpatient Hospital Stay (HOSPITAL_COMMUNITY)
Admission: AD | Admit: 2012-03-26 | Discharge: 2012-03-26 | Disposition: A | Discharge status: Home / Self Care | Payer: Medicaid Other | Source: Ambulatory Visit | Attending: Obstetrics | Admitting: Obstetrics

## 2012-03-26 ENCOUNTER — Encounter (HOSPITAL_COMMUNITY): Payer: Self-pay | Admitting: *Deleted

## 2012-03-26 DIAGNOSIS — Z3202 Encounter for pregnancy test, result negative: Secondary | ICD-10-CM

## 2012-03-26 DIAGNOSIS — R109 Unspecified abdominal pain: Secondary | ICD-10-CM | POA: Insufficient documentation

## 2012-03-26 HISTORY — DX: Anemia, unspecified: D64.9

## 2012-03-26 HISTORY — DX: Acne, unspecified: L70.9

## 2012-03-26 HISTORY — DX: Chlamydial infection, unspecified: A74.9

## 2012-03-26 LAB — URINALYSIS, ROUTINE W REFLEX MICROSCOPIC
Glucose, UA: NEGATIVE mg/dL
Ketones, ur: NEGATIVE mg/dL
Leukocytes, UA: NEGATIVE
pH: 6 (ref 5.0–8.0)

## 2012-03-26 LAB — URINE MICROSCOPIC-ADD ON

## 2012-03-26 LAB — POCT PREGNANCY, URINE: Preg Test, Ur: NEGATIVE

## 2012-03-26 NOTE — MAU Note (Signed)
Pt had termination of pregnancy in July. Came in in August for follow up reports everything was normal. Pt reprot for the past 2 days she has been experience abd pain/Fluttering every few minutes lasting all day long.  Denies vaginal bleeding or discharge at thisi time. Pt using Nuva ring for birht control and LMP 8/29?13

## 2012-03-26 NOTE — MAU Note (Signed)
Had termination in July, came back here the following month was feeling a throbbing or fluttery sensation- everything checked out, no retained POC, no sign of infection.   Same sensation started 3 days ago- accompanied with a sharp pain.  The sensation is in one specific place.  Last period was 08/29, is using Nuvaring. (3 wks on/1 wk off). abd is soft, tender to touch only in suprapubic area.

## 2012-03-26 NOTE — MAU Provider Note (Signed)
  History     CSN: [id number]  Arrival date and time: 03/26/12 9147   First Provider Initiated Contact with Patient 03/26/12 1045      Chief Complaint  Patient presents with  . Abdominal Pain   HPI [name] 26 y.o. Comes to MAU today with a feeling of "thumping" in her low midline abdomen.  Had termination of a pregnancy in July and is continuing to feel this sensation in her abdomen since the termination.  Was seen here on 02-29-12 and had a full pelvic exam.  Has been using the Nuvaring and has the same sex partner since testing was done in August.  Denies any pain today, but states when the thumping occurs, sometimes it is painful.  Reports a BM yesterday.    OB History    Grav Para Term Preterm Abortions TAB SAB Ect Mult Living   4 3 3  0 1 1 0 0 0 3      Past Medical History  Diagnosis Date  . Anemia   . Acne   . Chlamydia     Past Surgical History  Procedure Date  . Cesarean section   . Dilation and curettage of uterus     Family History  Problem Relation Age of Onset  . Adopted: Yes  . Other Neg Hx     History  Substance Use Topics  . Smoking status: Never Smoker   . Smokeless tobacco: Never Used  . Alcohol Use:     Allergies: No Known Allergies  Prescriptions prior to admission  Medication Sig Dispense Refill  . acetaminophen (TYLENOL) 325 MG tablet Take 650 mg by mouth daily as needed. For pain        Review of Systems  Gastrointestinal: Negative for nausea, vomiting and abdominal pain.  Genitourinary:       No vaginal discharge. No vaginal bleeding. No dysuria.   Physical Exam   Blood pressure 118/72, pulse 78, temperature 98.5 F (36.9 C), temperature source Oral, resp. rate 18, height 5\' 4"  (1.626 m), weight 93.26 kg (205 lb 9.6 oz), last menstrual period 03/09/2012.  Physical Exam  Nursing note and vitals reviewed. Constitutional: She is oriented to person, place, and time. She appears well-developed and well-nourished.  HENT:    Head: Normocephalic.  Eyes: EOM are normal.  Neck: Neck supple.  Musculoskeletal: Normal range of motion.  Neurological: She is alert and oriented to person, place, and time.  Skin: Skin is warm and dry.  Psychiatric: She has a normal mood and affect.    MAU Course  Procedures  MDM Discussed negative pregnancy test results.  Offered pelvic exam with STD cultures, but client declines.  Same partner as when testing was done on 02-29-12.  Wants the thumping to stop.  Advised that there is not a treatment I can give her today to stop this sensation.  Advised to follow up with Dr. Clearance Coots.  Assessment and Plan  Negative pregnancy test Sensation in lower abdomen  Plan Follow up with Dr. Clearance Coots Continue using the Nuvaring.  BURLESON,TERRI 03/26/2012, 10:47 AM

## 2012-06-18 ENCOUNTER — Emergency Department (HOSPITAL_COMMUNITY)
Admission: EM | Admit: 2012-06-18 | Discharge: 2012-06-18 | Disposition: A | Discharge status: Home / Self Care | Payer: Medicaid Other | Attending: Emergency Medicine | Admitting: Emergency Medicine

## 2012-06-18 ENCOUNTER — Encounter (HOSPITAL_COMMUNITY): Payer: Self-pay | Admitting: *Deleted

## 2012-06-18 DIAGNOSIS — Z8619 Personal history of other infectious and parasitic diseases: Secondary | ICD-10-CM | POA: Insufficient documentation

## 2012-06-18 DIAGNOSIS — R5381 Other malaise: Secondary | ICD-10-CM | POA: Insufficient documentation

## 2012-06-18 DIAGNOSIS — R42 Dizziness and giddiness: Secondary | ICD-10-CM | POA: Insufficient documentation

## 2012-06-18 DIAGNOSIS — Z3202 Encounter for pregnancy test, result negative: Secondary | ICD-10-CM | POA: Insufficient documentation

## 2012-06-18 DIAGNOSIS — R630 Anorexia: Secondary | ICD-10-CM | POA: Insufficient documentation

## 2012-06-18 DIAGNOSIS — R112 Nausea with vomiting, unspecified: Secondary | ICD-10-CM

## 2012-06-18 DIAGNOSIS — IMO0001 Reserved for inherently not codable concepts without codable children: Secondary | ICD-10-CM | POA: Insufficient documentation

## 2012-06-18 DIAGNOSIS — R5383 Other fatigue: Secondary | ICD-10-CM | POA: Insufficient documentation

## 2012-06-18 DIAGNOSIS — R197 Diarrhea, unspecified: Secondary | ICD-10-CM | POA: Insufficient documentation

## 2012-06-18 DIAGNOSIS — M2550 Pain in unspecified joint: Secondary | ICD-10-CM | POA: Insufficient documentation

## 2012-06-18 DIAGNOSIS — Z862 Personal history of diseases of the blood and blood-forming organs and certain disorders involving the immune mechanism: Secondary | ICD-10-CM | POA: Insufficient documentation

## 2012-06-18 LAB — CBC WITH DIFFERENTIAL/PLATELET
Basophils Absolute: 0 10*3/uL (ref 0.0–0.1)
Eosinophils Absolute: 0.1 10*3/uL (ref 0.0–0.7)
Eosinophils Relative: 1 % (ref 0–5)
HCT: 34.9 % — ABNORMAL LOW (ref 36.0–46.0)
Lymphocytes Relative: 23 % (ref 12–46)
MCH: 27 pg (ref 26.0–34.0)
MCHC: 33.2 g/dL (ref 30.0–36.0)
MCV: 81.4 fL (ref 78.0–100.0)
Monocytes Absolute: 0.4 10*3/uL (ref 0.1–1.0)
RDW: 15 % (ref 11.5–15.5)

## 2012-06-18 LAB — URINALYSIS, MICROSCOPIC ONLY
Glucose, UA: NEGATIVE mg/dL
Leukocytes, UA: NEGATIVE
Protein, ur: NEGATIVE mg/dL
Urobilinogen, UA: 1 mg/dL (ref 0.0–1.0)

## 2012-06-18 LAB — COMPREHENSIVE METABOLIC PANEL
AST: 8 U/L (ref 0–37)
CO2: 24 mEq/L (ref 19–32)
Calcium: 9.5 mg/dL (ref 8.4–10.5)
Creatinine, Ser: 0.71 mg/dL (ref 0.50–1.10)
GFR calc Af Amer: >90 mL/min (ref >90)
GFR calc non Af Amer: >90 mL/min (ref >90)

## 2012-06-18 MED ORDER — SODIUM CHLORIDE 0.9 % IV BOLUS (SEPSIS)
1000.0000 mL | Freq: Once | INTRAVENOUS | Status: AC
  Administered 2012-06-18: 1000 mL via INTRAVENOUS

## 2012-06-18 MED ORDER — ONDANSETRON HCL 4 MG/2ML IJ SOLN
4.0000 mg | Freq: Once | INTRAMUSCULAR | Status: AC
  Administered 2012-06-18: 4 mg via INTRAVENOUS
  Filled 2012-06-18: qty 2

## 2012-06-18 MED ORDER — ONDANSETRON HCL 4 MG PO TABS: 4.0000 mg | ORAL_TABLET | Freq: Four times a day (QID) | ORAL | Status: DC

## 2012-06-18 NOTE — ED Notes (Signed)
Pt presents to department for evaluation of N/V/D and lower abdominal cramping, ongoing x1 week. Denies urinary symptoms. Denies fever. Also states generalized weakness and fatigue. Denies pain at the time. No active vomiting. Pt is conscious alert and oriented x4.

## 2012-06-18 NOTE — ED Provider Notes (Signed)
History     CSN: [id number]  Arrival date & time 06/18/12  1026   First MD Initiated Contact with Patient 06/18/12 1131      Chief Complaint  Patient presents with  . Diarrhea  . Emesis    (Consider location/radiation/quality/duration/timing/severity/associated sxs/prior treatment) HPI Comments: Patient presents to ED with one week history of not feeling well with fatigue and nausea. She reports 2 episodes of vomiting in the past 24 hours and some diarrhea yesterday. Denies any fever, chills, shortness of breath or chest pain. She reports no appetite and feeling generally weak and lightheaded especially with standing. Reported to be negative Pregnancy test at home. Denies sick contacts or unusual foods. Denies dysuria, hematuria, vaginal bleeding or discharge.  The history is provided by the patient.    Past Medical History  Diagnosis Date  . Anemia   . Acne   . Chlamydia     Past Surgical History  Procedure Date  . Cesarean section   . Dilation and curettage of uterus     Family History  Problem Relation Age of Onset  . Adopted: Yes  . Other Neg Hx     History  Substance Use Topics  . Smoking status: Never Smoker   . Smokeless tobacco: Never Used  . Alcohol Use: No    OB History    Grav Para Term Preterm Abortions TAB SAB Ect Mult Living   4 3 3  0 1 1 0 0 0 3      Review of Systems  Constitutional: Positive for activity change, appetite change and fatigue. Negative for fever.  HENT: Negative for congestion.   Respiratory: Negative for cough, chest tightness and shortness of breath.   Cardiovascular: Negative for chest pain.  Gastrointestinal: Positive for nausea, vomiting and diarrhea. Negative for abdominal pain.  Genitourinary: Negative for dysuria, vaginal bleeding and vaginal discharge.  Musculoskeletal: Positive for myalgias and arthralgias.  Skin: Negative for rash.  Neurological: Positive for weakness. Negative for headaches.    Allergies   Review of patient's allergies indicates no known allergies.  Home Medications   Current Outpatient Rx  Name  Route  Sig  Dispense  Refill  . ACETAMINOPHEN 325 MG PO TABS   Oral   Take 650 mg by mouth daily as needed. For pain         . BISMUTH SUBSALICYLATE 262 MG PO CHEW   Oral   Chew 524 mg by mouth as needed. For upset stomach         . ETONOGESTREL-ETHINYL ESTRADIOL 0.12-0.015 MG/24HR VA RING   Vaginal   Place 1 each vaginally every 28 (twenty-eight) days. Insert vaginally and leave in place for 3 consecutive weeks, then remove for 1 week.         Marland Kitchen ONDANSETRON HCL 4 MG PO TABS   Oral   Take 1 tablet (4 mg total) by mouth every 6 (six) hours.   12 tablet   0     BP 124/102  Pulse 72  Temp 97.7 F (36.5 C) (Oral)  Resp 16  SpO2 100%  LMP 05/21/2012  Physical Exam  Constitutional: She is oriented to person, place, and time. She appears well-developed. No distress.  HENT:  Head: Normocephalic and atraumatic.  Mouth/Throat: Oropharynx is clear and moist. No oropharyngeal exudate.  Eyes: Conjunctivae normal and EOM are normal. Pupils are equal, round, and reactive to light.  Neck: Normal range of motion. Neck supple.       No meningismus  Cardiovascular: Normal rate, regular rhythm and normal heart sounds.   No murmur heard. Pulmonary/Chest: Effort normal and breath sounds normal. No respiratory distress.  Abdominal: Soft. There is no tenderness. There is no rebound and no guarding.  Musculoskeletal: Normal range of motion. She exhibits no edema and no tenderness.  Neurological: She is alert and oriented to person, place, and time. No cranial nerve deficit. She exhibits normal muscle tone. Coordination normal.  Skin: Skin is warm.    ED Course  Procedures (including critical care time)  Labs Reviewed  CBC WITH DIFFERENTIAL - Abnormal; Notable for the following:    Hemoglobin 11.6 (*)     HCT 34.9 (*)     All other components within normal limits   COMPREHENSIVE METABOLIC PANEL - Abnormal; Notable for the following:    Albumin 3.4 (*)     Total Bilirubin 0.2 (*)     All other components within normal limits  URINALYSIS, MICROSCOPIC ONLY - Abnormal; Notable for the following:    Hgb urine dipstick SMALL (*)     All other components within normal limits  LIPASE, BLOOD  RAPID STREP SCREEN  MONONUCLEOSIS SCREEN  POCT PREGNANCY, URINE   No results found.   1. Nausea and vomiting       MDM  Fatigue, generalized weakness, nausea, vomiting diarrhea. Abdomen soft and nontender. Vitals stable.  Hemoglobin stable. UA negative. HCG negative. Labs otherwise unremarkable.  Tolerating PO in the ED, no vomiting.  Abdomen soft and nontender.  Supportive care for viral syndrome. PCP followup.     Glynn Octave, MD 06/18/12 [phone]

## 2012-06-18 NOTE — ED Notes (Signed)
Pt reports not feeling well x 1 week. Having fatigue, diarrhea, vomiting and no appetite. Reports taking preg test at home and it was negative. lmp 11/10

## 2012-08-26 ENCOUNTER — Other Ambulatory Visit: Payer: Self-pay

## 2012-08-31 ENCOUNTER — Emergency Department (HOSPITAL_COMMUNITY)
Admission: EM | Admit: 2012-08-31 | Discharge: 2012-08-31 | Disposition: A | Discharge status: Home / Self Care | Payer: Medicaid Other | Attending: Emergency Medicine | Admitting: Emergency Medicine

## 2012-08-31 ENCOUNTER — Encounter (HOSPITAL_COMMUNITY): Payer: Self-pay | Admitting: Cardiology

## 2012-08-31 DIAGNOSIS — K120 Recurrent oral aphthae: Secondary | ICD-10-CM | POA: Insufficient documentation

## 2012-08-31 DIAGNOSIS — Z8619 Personal history of other infectious and parasitic diseases: Secondary | ICD-10-CM | POA: Insufficient documentation

## 2012-08-31 DIAGNOSIS — K1370 Unspecified lesions of oral mucosa: Secondary | ICD-10-CM | POA: Insufficient documentation

## 2012-08-31 DIAGNOSIS — Z862 Personal history of diseases of the blood and blood-forming organs and certain disorders involving the immune mechanism: Secondary | ICD-10-CM | POA: Insufficient documentation

## 2012-08-31 DIAGNOSIS — Z872 Personal history of diseases of the skin and subcutaneous tissue: Secondary | ICD-10-CM | POA: Insufficient documentation

## 2012-08-31 NOTE — ED Notes (Signed)
Pt reports she thinks she has an ulcer the in the left side of her mouth. Reports she tried to go to her dentist but was told they would not see her there.

## 2012-08-31 NOTE — ED Provider Notes (Signed)
History  This chart was scribed for non-physician practitioner working with Doug Sou, MD by Ardeen Jourdain, ED Scribe. This patient was seen in room TR08C/TR08C and the patient's care was started at 1634.  CSN: [id number]  Arrival date & time 08/31/12  1623   First MD Initiated Contact with Patient 08/31/12 1634      Chief Complaint  Patient presents with  . Abscess     Patient is a 27 y.o. female presenting with abscess. The history is provided by the patient. No language interpreter was used.  Abscess Location:  Mouth Mouth abscess location:  L inner cheek Abscess quality: painful   Abscess quality: not draining, no fluctuance, no induration, no itching and not weeping   Red streaking: no   Duration:  3 days Progression:  Unchanged Pain details:    Quality:  Aching and throbbing   Severity:  Mild   Duration:  3 days   Timing:  Constant   Progression:  Unchanged Chronicity:  New Associated symptoms: no fever, no nausea and no vomiting     [name] is a 27 y.o. female who presents to the Emergency Department complaining of an "ulcer" inside of her mouth that began 3 days ago and has been gradually worsening. She states she tried to see her dentist but they would not see her. She denies any previous h/o dental pain or issues.   Past Medical History  Diagnosis Date  . Anemia   . Acne   . Chlamydia     Past Surgical History  Procedure Laterality Date  . Cesarean section    . Dilation and curettage of uterus      Family History  Problem Relation Age of Onset  . Adopted: Yes  . Other Neg Hx     History  Substance Use Topics  . Smoking status: Never Smoker   . Smokeless tobacco: Never Used  . Alcohol Use: No    OB History   Grav Para Term Preterm Abortions TAB SAB Ect Mult Living   4 3 3  0 1 1 0 0 0 3      Review of Systems  Constitutional: Negative for fever and chills.  HENT: Positive for mouth sores.   Gastrointestinal: Negative for  nausea and vomiting.  All other systems reviewed and are negative.    Allergies  Review of patient's allergies indicates no known allergies.  Home Medications  No current outpatient prescriptions on file.  Triage Vitals: BP 138/82  Pulse 86  Temp(Src) 99.2 F (37.3 C)  Resp 18  SpO2 100%  Physical Exam  Nursing note and vitals reviewed. Constitutional: She is oriented to person, place, and time. She appears well-developed and well-nourished. No distress.  HENT:  Head: Normocephalic and atraumatic.  1 cm by 5 mm ulceration of the posterior buccal mucosa on the left consistent with aphthous ulcer. No fluctuance or erythema is noted, airway is patent, uvula midline, no pharyngeal erythema or edema. Good dentition, no carries   Eyes: EOM are normal. Pupils are equal, round, and reactive to light.  Neck: Normal range of motion. Neck supple. No tracheal deviation present.  Cardiovascular: Normal rate.   Pulmonary/Chest: Effort normal. No respiratory distress.  Abdominal: Soft. She exhibits no distension.  Musculoskeletal: Normal range of motion. She exhibits no edema.  Neurological: She is alert and oriented to person, place, and time.  Skin: Skin is warm and dry.  Psychiatric: She has a normal mood and affect. Her behavior is normal.  ED Course  Procedures (including critical care time)  DIAGNOSTIC STUDIES: Oxygen Saturation is 100% on room air, normal by my interpretation.    COORDINATION OF CARE:  4:42 PMDiscussed treatment plan which  with pt at bedside and pt agreed to plan.     Labs Reviewed - No data to display No results found.   1. Aphthous ulcer of mouth       MDM  Patient with small ulceration of the buccal mucosa consistent with aphthous ulcer.. No concern for herpetic lesions or leukoplakia. Patient will be d/c with directions for supportive treatment.  At this time there does not appear to be any evidence of an acute emergency medical condition and  the patient appears stable for discharge with appropriate outpatient follow up.Diagnosis was discussed with patient who verbalizes understanding and is agreeable to discharge.follow up with pcp.     I personally performed the services described in this documentation, which was scribed in my presence. The recorded information has been reviewed and is accurate.      Arthor Captain, PA-C 09/01/12 0031

## 2012-08-31 NOTE — ED Notes (Signed)
Mouth sore left lower jaw. Denies tooth pain.

## 2012-09-01 NOTE — ED Provider Notes (Signed)
Medical screening examination/treatment/procedure(s) were performed by non-physician practitioner and as supervising physician I was immediately available for consultation/collaboration.  Doug Sou, MD 09/01/12 [phone]

## 2013-04-11 ENCOUNTER — Emergency Department (HOSPITAL_COMMUNITY)
Admission: EM | Admit: 2013-04-11 | Discharge: 2013-04-12 | Disposition: A | Discharge status: Home / Self Care | Payer: Medicaid Other | Attending: Emergency Medicine | Admitting: Emergency Medicine

## 2013-04-11 ENCOUNTER — Encounter (HOSPITAL_COMMUNITY): Payer: Self-pay | Admitting: Emergency Medicine

## 2013-04-11 DIAGNOSIS — Z862 Personal history of diseases of the blood and blood-forming organs and certain disorders involving the immune mechanism: Secondary | ICD-10-CM | POA: Insufficient documentation

## 2013-04-11 DIAGNOSIS — Z872 Personal history of diseases of the skin and subcutaneous tissue: Secondary | ICD-10-CM | POA: Insufficient documentation

## 2013-04-11 DIAGNOSIS — Z711 Person with feared health complaint in whom no diagnosis is made: Secondary | ICD-10-CM | POA: Insufficient documentation

## 2013-04-11 DIAGNOSIS — Z0000 Encounter for general adult medical examination without abnormal findings: Secondary | ICD-10-CM

## 2013-04-11 DIAGNOSIS — Z8619 Personal history of other infectious and parasitic diseases: Secondary | ICD-10-CM | POA: Insufficient documentation

## 2013-04-11 NOTE — ED Notes (Signed)
Pt. reports lips swelling " burning" onset today , denies injury , airway intact /respirations unlabored .

## 2013-04-12 NOTE — ED Notes (Signed)
No discoloration, swelling, lesions noted upon assessment of pt mouth/lips

## 2013-04-12 NOTE — ED Provider Notes (Signed)
CSN: [id number]     Arrival date & time 04/11/13  2237 History   First MD Initiated Contact with Patient 04/12/13 0003     Chief Complaint  Patient presents with  . Oral Swelling   (Consider location/radiation/quality/duration/timing/severity/associated sxs/prior Treatment) HPI Comments: 27 year old female presents to the emergency department complaining of lower lip swelling and burning sensation beginning earlier in the day yesterday. States she has been in contact with a friend who has oral herpes and shared a drink with her a few days ago. She has not noticed any lesions inside of her mouth. Denies difficulty breathing or swallowing. No new soaps, detergents, recent travel, new foods.  The history is provided by the patient.    Past Medical History  Diagnosis Date  . Anemia   . Acne   . Chlamydia    Past Surgical History  Procedure Laterality Date  . Cesarean section    . Dilation and curettage of uterus     Family History  Problem Relation Age of Onset  . Adopted: Yes  . Other Neg Hx    History  Substance Use Topics  . Smoking status: Never Smoker   . Smokeless tobacco: Never Used  . Alcohol Use: No   OB History   Grav Para Term Preterm Abortions TAB SAB Ect Mult Living   4 3 3  0 1 1 0 0 0 3     Review of Systems  Constitutional: Negative for activity change.  HENT: Positive for facial swelling.   Respiratory: Negative for shortness of breath.   All other systems reviewed and are negative.    Allergies  Review of patient's allergies indicates no known allergies.  Home Medications   Current Outpatient Rx  Name  Route  Sig  Dispense  Refill  . ibuprofen (ADVIL,MOTRIN) 200 MG tablet   Oral   Take 800 mg by mouth every 6 (six) hours as needed for pain.          BP 117/79  Pulse 94  Temp(Src) 98.7 F (37.1 C) (Oral)  Resp 14  SpO2 97%  LMP 03/31/2013 Physical Exam  Nursing note and vitals reviewed. Constitutional: She is oriented to person,  place, and time. She appears well-developed and well-nourished. No distress.  HENT:  Head: Normocephalic and atraumatic.  Mouth/Throat: Uvula is midline and oropharynx is clear and moist. No oral lesions.  No swelling of lips.  Eyes: Conjunctivae are normal.  Neck: Normal range of motion. Neck supple.  Cardiovascular: Normal rate, regular rhythm and normal heart sounds.   Pulmonary/Chest: Effort normal and breath sounds normal.  Musculoskeletal: Normal range of motion. She exhibits no edema.  Neurological: She is alert and oriented to person, place, and time.  Skin: Skin is warm and dry. She is not diaphoretic.  Psychiatric: She has a normal mood and affect. Her behavior is normal.    ED Course  Procedures (including critical care time) Labs Review Labs Reviewed - No data to display Imaging Review No results found.  MDM   1. Normal physical exam    Patient with concern of herpes labialis. No lesions present. Normal physical exam. No facial swelling. Normal vital signs, she is in no apparent distress. Discussed symptoms to look for for developing herpes. Return precautions discussed. Patient states understanding of plan and is agreeable.    Trevor Mace, PA-C 04/12/13 [phone]

## 2013-04-12 NOTE — ED Provider Notes (Signed)
Medical screening examination/treatment/procedure(s) were performed by non-physician practitioner and as supervising physician I was immediately available for consultation/collaboration.   Emrie Gayle, MD 04/12/13 0512 

## 2013-05-16 ENCOUNTER — Institutional Professional Consult (permissible substitution): Payer: Self-pay | Admitting: Obstetrics & Gynecology

## 2013-05-17 ENCOUNTER — Other Ambulatory Visit: Payer: Self-pay

## 2013-06-22 ENCOUNTER — Emergency Department (HOSPITAL_COMMUNITY)
Admission: EM | Admit: 2013-06-22 | Discharge: 2013-06-22 | Disposition: A | Discharge status: Home / Self Care | Payer: Medicaid Other | Attending: Emergency Medicine | Admitting: Emergency Medicine

## 2013-06-22 ENCOUNTER — Encounter (HOSPITAL_COMMUNITY): Payer: Self-pay | Admitting: Emergency Medicine

## 2013-06-22 DIAGNOSIS — Z872 Personal history of diseases of the skin and subcutaneous tissue: Secondary | ICD-10-CM | POA: Insufficient documentation

## 2013-06-22 DIAGNOSIS — K0889 Other specified disorders of teeth and supporting structures: Secondary | ICD-10-CM

## 2013-06-22 DIAGNOSIS — K089 Disorder of teeth and supporting structures, unspecified: Secondary | ICD-10-CM | POA: Insufficient documentation

## 2013-06-22 DIAGNOSIS — Z8619 Personal history of other infectious and parasitic diseases: Secondary | ICD-10-CM | POA: Insufficient documentation

## 2013-06-22 DIAGNOSIS — Z862 Personal history of diseases of the blood and blood-forming organs and certain disorders involving the immune mechanism: Secondary | ICD-10-CM | POA: Insufficient documentation

## 2013-06-22 MED ORDER — HYDROCODONE-ACETAMINOPHEN 5-325 MG PO TABS: 1.0000 | ORAL_TABLET | ORAL | Status: DC | PRN

## 2013-06-22 NOTE — ED Provider Notes (Signed)
Medical screening examination/treatment/procedure(s) were performed by non-physician practitioner and as supervising physician I was immediately available for consultation/collaboration.  EKG Interpretation   None         Lauriel Helin W Jordain Radin, MD 06/22/13 1825 

## 2013-06-22 NOTE — ED Provider Notes (Signed)
CSN: [id number]     Arrival date & time 06/22/13  1207 History   First MD Initiated Contact with Patient 06/22/13 1245     Chief Complaint  Patient presents with  . Dental Pain   (Consider location/radiation/quality/duration/timing/severity/associated sxs/prior Treatment) The history is provided by the patient and medical records.   This is a 27 y.o. F with no significant PMH presenting to the ED for right lower dental pain.  Pt states she was seen by her dentist last week, told she had a "deep cavity" of one of her right lower molars. It was advised that she have a root canal performed/have tooth extracted, but patient declined. States in the past week she has developed severe right lower dental pain, worse upon contact with hot/cold food or drink.  No numbness or paresthesias of face.  No fevers, sweats, or chills.  No difficulty swallowing.  No trismus.  Pt has FU appt with dentist next week on 06/28/13.  Has been taking tylenol and aleve at home without noted improvement.  Past Medical History  Diagnosis Date  . Anemia   . Acne   . Chlamydia    Past Surgical History  Procedure Laterality Date  . Cesarean section    . Dilation and curettage of uterus     Family History  Problem Relation Age of Onset  . Adopted: Yes  . Other Neg Hx    History  Substance Use Topics  . Smoking status: Never Smoker   . Smokeless tobacco: Never Used  . Alcohol Use: No   OB History   Grav Para Term Preterm Abortions TAB SAB Ect Mult Living   4 3 3  0 1 1 0 0 0 3     Review of Systems  HENT: Positive for dental problem.   All other systems reviewed and are negative.    Allergies  Review of patient's allergies indicates no known allergies.  Home Medications   Current Outpatient Rx  Name  Route  Sig  Dispense  Refill  . HYDROcodone-acetaminophen (NORCO/VICODIN) 5-325 MG per tablet   Oral   Take 1 tablet by mouth every 4 (four) hours as needed.   10 tablet   0   . ibuprofen  (ADVIL,MOTRIN) 200 MG tablet   Oral   Take 800 mg by mouth every 6 (six) hours as needed for pain.          BP 123/60  Pulse 74  Temp(Src) 98.1 F (36.7 C) (Oral)  Resp 18  SpO2 99%  Physical Exam  Nursing note and vitals reviewed. Constitutional: She is oriented to person, place, and time. She appears well-developed and well-nourished. No distress.  HENT:  Head: Normocephalic and atraumatic.  Mouth/Throat: Uvula is midline, oropharynx is clear and moist and mucous membranes are normal. No trismus in the jaw. Normal dentition. No dental abscesses or dental caries. No oropharyngeal exudate, posterior oropharyngeal edema, posterior oropharyngeal erythema or tonsillar abscesses.    Teeth largely in good dentition, right lower molar TTP, surrounding gingiva normal in appearance without signs of abscess formation, handling secretions appropriately, no trismus, no difficulty swallowing or speaking, no facial swelling  Eyes: Conjunctivae and EOM are normal. Pupils are equal, round, and reactive to light.  Neck: Normal range of motion. Neck supple.  Cardiovascular: Normal rate, regular rhythm and normal heart sounds.   Pulmonary/Chest: Effort normal and breath sounds normal. No respiratory distress. She has no wheezes.  Musculoskeletal: Normal range of motion.  Neurological: She is alert and  oriented to person, place, and time.  Skin: Skin is warm and dry. She is not diaphoretic.  Psychiatric: She has a normal mood and affect.    ED Course  Procedures (including critical care time) Labs Review Labs Reviewed - No data to display Imaging Review No results found.  EKG Interpretation   None       MDM   1. Pain, dental    Dental pain without signs of dental abscess.  Rx vicodin.  FU with dentist as previously scheduled.  Discussed plan with pt, she agreed.  Return precautions advised.  Garlon Hatchet, PA-C 06/22/13 1348

## 2013-06-22 NOTE — ED Notes (Signed)
Pt states a dentist told her last year that she had "a deep cavity". States he wanted to do a root canal or pull it but pt chose not to at that time. Tooth started hurting last week, called that dentist back but was unable to get her in until next week. Pt here for dental pain.

## 2013-06-22 NOTE — ED Notes (Signed)
Pt c/o right sided dental pain/

## 2013-07-15 ENCOUNTER — Emergency Department (HOSPITAL_COMMUNITY)
Admission: EM | Admit: 2013-07-15 | Discharge: 2013-07-15 | Disposition: A | Discharge status: Home / Self Care | Payer: Medicaid Other | Attending: Emergency Medicine | Admitting: Emergency Medicine

## 2013-07-15 ENCOUNTER — Encounter (HOSPITAL_COMMUNITY): Payer: Self-pay | Admitting: Emergency Medicine

## 2013-07-15 DIAGNOSIS — M545 Low back pain, unspecified: Secondary | ICD-10-CM

## 2013-07-15 DIAGNOSIS — Y9389 Activity, other specified: Secondary | ICD-10-CM | POA: Insufficient documentation

## 2013-07-15 DIAGNOSIS — Z862 Personal history of diseases of the blood and blood-forming organs and certain disorders involving the immune mechanism: Secondary | ICD-10-CM | POA: Insufficient documentation

## 2013-07-15 DIAGNOSIS — Z975 Presence of (intrauterine) contraceptive device: Secondary | ICD-10-CM | POA: Insufficient documentation

## 2013-07-15 DIAGNOSIS — Y92410 Unspecified street and highway as the place of occurrence of the external cause: Secondary | ICD-10-CM | POA: Insufficient documentation

## 2013-07-15 DIAGNOSIS — IMO0002 Reserved for concepts with insufficient information to code with codable children: Secondary | ICD-10-CM | POA: Insufficient documentation

## 2013-07-15 DIAGNOSIS — Z872 Personal history of diseases of the skin and subcutaneous tissue: Secondary | ICD-10-CM | POA: Insufficient documentation

## 2013-07-15 DIAGNOSIS — Z8619 Personal history of other infectious and parasitic diseases: Secondary | ICD-10-CM | POA: Insufficient documentation

## 2013-07-15 MED ORDER — IBUPROFEN 400 MG PO TABS
800.0000 mg | ORAL_TABLET | Freq: Once | ORAL | Status: AC
  Administered 2013-07-15: 800 mg via ORAL
  Filled 2013-07-15: qty 2

## 2013-07-15 MED ORDER — CYCLOBENZAPRINE HCL 10 MG PO TABS: 10.0000 mg | ORAL_TABLET | Freq: Three times a day (TID) | ORAL | Status: DC | PRN

## 2013-07-15 MED ORDER — IBUPROFEN 800 MG PO TABS: 800.0000 mg | ORAL_TABLET | Freq: Three times a day (TID) | ORAL | Status: DC | PRN
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
## 2013-07-16 NOTE — ED Provider Notes (Signed)
Medical screening examination/treatment/procedure(s) were performed by non-physician practitioner and as supervising physician I was immediately available for consultation/collaboration.  EKG Interpretation   None        Doug SouSam Tacarra Justo, MD 07/16/13 [phone]

## 2013-08-14 ENCOUNTER — Other Ambulatory Visit: Payer: Self-pay | Admitting: Obstetrics

## 2013-08-15 ENCOUNTER — Other Ambulatory Visit: Payer: Self-pay | Admitting: *Deleted

## 2013-08-15 DIAGNOSIS — B373 Candidiasis of vulva and vagina: Secondary | ICD-10-CM

## 2013-08-15 DIAGNOSIS — B3731 Acute candidiasis of vulva and vagina: Secondary | ICD-10-CM

## 2013-08-15 MED ORDER — FLUCONAZOLE 150 MG PO TABS: 150.0000 mg | ORAL_TABLET | Freq: Once | ORAL | Status: DC

## 2013-09-17 ENCOUNTER — Other Ambulatory Visit: Payer: Self-pay | Admitting: Obstetrics

## 2013-09-24 ENCOUNTER — Ambulatory Visit (INDEPENDENT_AMBULATORY_CARE_PROVIDER_SITE_OTHER): Payer: Medicaid Other | Admitting: Obstetrics

## 2013-09-24 ENCOUNTER — Encounter: Payer: Self-pay | Admitting: Obstetrics

## 2013-09-24 VITALS — BP 123/88 | HR 74 | Temp 98.2°F | Wt 215.0 lb

## 2013-09-24 DIAGNOSIS — Z124 Encounter for screening for malignant neoplasm of cervix: Secondary | ICD-10-CM

## 2013-09-24 DIAGNOSIS — Z3040 Encounter for surveillance of contraceptives, unspecified: Secondary | ICD-10-CM

## 2013-09-24 DIAGNOSIS — Z01419 Encounter for gynecological examination (general) (routine) without abnormal findings: Secondary | ICD-10-CM

## 2013-09-24 DIAGNOSIS — B3731 Acute candidiasis of vulva and vagina: Secondary | ICD-10-CM

## 2013-09-24 DIAGNOSIS — Z113 Encounter for screening for infections with a predominantly sexual mode of transmission: Secondary | ICD-10-CM

## 2013-09-24 DIAGNOSIS — Z0000 Encounter for general adult medical examination without abnormal findings: Secondary | ICD-10-CM

## 2013-09-24 DIAGNOSIS — Z3202 Encounter for pregnancy test, result negative: Secondary | ICD-10-CM

## 2013-09-24 DIAGNOSIS — B373 Candidiasis of vulva and vagina: Secondary | ICD-10-CM

## 2013-09-24 LAB — POCT URINALYSIS DIPSTICK
Bilirubin, UA: NEGATIVE
Glucose, UA: NEGATIVE
Ketones, UA: NEGATIVE
NITRITE UA: NEGATIVE
PH UA: 6.5
PROTEIN UA: NEGATIVE
Spec Grav, UA: 1.015
UROBILINOGEN UA: NEGATIVE

## 2013-09-24 LAB — POCT URINE PREGNANCY: PREG TEST UR: NEGATIVE

## 2013-09-24 MED ORDER — CLINDAMYCIN HCL 300 MG PO CAPS: 300.0000 mg | ORAL_CAPSULE | Freq: Three times a day (TID) | ORAL | Status: DC

## 2013-09-24 MED ORDER — BUTOCONAZOLE NITRATE (1 DOSE) 2 % VA CREA: 1.0000 | TOPICAL_CREAM | Freq: Every evening | VAGINAL | Status: DC

## 2013-09-24 MED ORDER — ETONOGESTREL-ETHINYL ESTRADIOL 0.12-0.015 MG/24HR VA RING
VAGINAL_RING | VAGINAL | Status: DC
Start: 2013-09-24 — End: 2015-12-06

## 2013-09-24 NOTE — Progress Notes (Signed)
Subjective:     [name] is a 28 y.o. female here for a routine exam.  Current complaints: pt would like to discuss permanent birth control options, tubal and essure.  Pt states that she and her husband do not wish for any more children.  Pt is currently on Nuva Ring and is in need of refill.  Pt states that she has not has nuva ring in place since February.  Pt request UPT at today's visit.  Personal health questionnaire reviewed: yes.   Gynecologic History Patient's last menstrual period was 08/24/2013. Contraception: NuvaRing vaginal inserts Last Pap: 09/2012. Results were: normal Last mammogram: n/a  Obstetric History OB History  Gravida Para Term Preterm AB SAB TAB Ectopic Multiple Living  4 3 3  0 1 0 1 0 0 3    # Outcome Date GA Lbr Len/2nd Weight Sex Delivery Anes PTL Lv  4 TRM     F LTCS   Y     Comments: repeat c/section  3 TRM     F LTCS   Y     Comments: repeat c/section,  failed VBAC  2 TRM     M LTCS   Y     Comments: fetal distress  1 TAB                The following portions of the patient's history were reviewed and updated as appropriate: allergies, current medications, past family history, past medical history, past social history, past surgical history and problem list.  Review of Systems Pertinent items are noted in HPI.    Objective:    General appearance: alert and no distress Breasts: normal appearance, no masses or tenderness Abdomen: normal findings: soft, non-tender Pelvic: cervix normal in appearance, external genitalia normal, no adnexal masses or tenderness, no cervical motion tenderness, rectovaginal septum normal, uterus normal size, shape, and consistency and vagina normal without discharge    Assessment:    Healthy female exam.    Plan:    Follow up in: 1 year.

## 2013-09-25 ENCOUNTER — Encounter: Payer: Self-pay | Admitting: Obstetrics

## 2013-09-25 LAB — PAP IG W/ RFLX HPV ASCU

## 2013-09-25 LAB — WET PREP BY MOLECULAR PROBE
Candida species: POSITIVE — AB
Gardnerella vaginalis: NEGATIVE
Trichomonas vaginosis: NEGATIVE

## 2013-09-25 LAB — GC/CHLAMYDIA PROBE AMP
CT Probe RNA: NEGATIVE
GC Probe RNA: NEGATIVE

## 2013-10-22 ENCOUNTER — Ambulatory Visit: Payer: Medicaid Other | Admitting: Obstetrics & Gynecology

## 2014-01-24 ENCOUNTER — Encounter (HOSPITAL_COMMUNITY): Payer: Self-pay | Admitting: Emergency Medicine

## 2014-01-24 ENCOUNTER — Emergency Department (HOSPITAL_COMMUNITY)
Admission: EM | Admit: 2014-01-24 | Discharge: 2014-01-24 | Discharge status: Against Medical Advice | Payer: Medicaid Other | Attending: Emergency Medicine | Admitting: Emergency Medicine

## 2014-01-24 DIAGNOSIS — T4995XA Adverse effect of unspecified topical agent, initial encounter: Secondary | ICD-10-CM | POA: Diagnosis not present

## 2014-01-24 DIAGNOSIS — R21 Rash and other nonspecific skin eruption: Secondary | ICD-10-CM | POA: Insufficient documentation

## 2014-01-24 NOTE — ED Notes (Signed)
Pt explained of delay but states that she has to leave to pickup a family member. NAD noted at this time. Informed to return or call 911.

## 2014-01-24 NOTE — ED Notes (Signed)
Pt c/o tongue feeling like it is on fire along with rash on face, sts it started last night. Reports no new foods recently. Pt sts she can't think of anything that caused this. Pt has tiny red bumps to tongue and small red bump on inner lip. sts she feels like she is having a hard time swallowing but still able to swallow, reports "It just doesn't feel normal". Pt able to speak in full sentences. Denies feeling sob. Nad, skin warm and dry, resp e/u.

## 2014-05-13 ENCOUNTER — Encounter (HOSPITAL_COMMUNITY): Payer: Self-pay | Admitting: Emergency Medicine

## 2014-07-08 ENCOUNTER — Encounter: Payer: Self-pay | Admitting: *Deleted

## 2014-07-09 ENCOUNTER — Encounter: Payer: Self-pay | Admitting: Obstetrics & Gynecology

## 2014-07-10 ENCOUNTER — Emergency Department (HOSPITAL_COMMUNITY)
Admission: EM | Admit: 2014-07-10 | Discharge: 2014-07-10 | Disposition: A | Discharge status: Home / Self Care | Payer: No Typology Code available for payment source | Attending: Emergency Medicine | Admitting: Emergency Medicine

## 2014-07-10 ENCOUNTER — Encounter (HOSPITAL_COMMUNITY): Payer: Self-pay | Admitting: Emergency Medicine

## 2014-07-10 DIAGNOSIS — R110 Nausea: Secondary | ICD-10-CM | POA: Insufficient documentation

## 2014-07-10 DIAGNOSIS — Z8619 Personal history of other infectious and parasitic diseases: Secondary | ICD-10-CM | POA: Insufficient documentation

## 2014-07-10 DIAGNOSIS — Z872 Personal history of diseases of the skin and subcutaneous tissue: Secondary | ICD-10-CM | POA: Insufficient documentation

## 2014-07-10 DIAGNOSIS — Z862 Personal history of diseases of the blood and blood-forming organs and certain disorders involving the immune mechanism: Secondary | ICD-10-CM | POA: Insufficient documentation

## 2014-07-10 DIAGNOSIS — K029 Dental caries, unspecified: Secondary | ICD-10-CM | POA: Insufficient documentation

## 2014-07-10 DIAGNOSIS — K0889 Other specified disorders of teeth and supporting structures: Secondary | ICD-10-CM

## 2014-07-10 DIAGNOSIS — K088 Other specified disorders of teeth and supporting structures: Secondary | ICD-10-CM | POA: Insufficient documentation

## 2014-07-10 MED ORDER — IBUPROFEN 800 MG PO TABS: 800.0000 mg | ORAL_TABLET | Freq: Three times a day (TID) | ORAL | Status: DC | PRN

## 2014-07-10 MED ORDER — HYDROCODONE-ACETAMINOPHEN 5-325 MG PO TABS: 1.0000 | ORAL_TABLET | ORAL | Status: DC | PRN

## 2014-07-10 MED ORDER — HYDROCODONE-ACETAMINOPHEN 5-325 MG PO TABS
2.0000 | ORAL_TABLET | Freq: Once | ORAL | Status: AC
  Administered 2014-07-10: 2 via ORAL
  Filled 2014-07-10: qty 2

## 2014-07-10 MED ORDER — PENICILLIN V POTASSIUM 500 MG PO TABS: 500.0000 mg | ORAL_TABLET | Freq: Four times a day (QID) | ORAL | Status: AC

## 2014-07-10 NOTE — Discharge Instructions (Signed)
Read the information below.  Use the prescribed medication as directed.  Please discuss all new medications with your pharmacist.  Do not take additional tylenol while taking the prescribed pain medication to avoid overdose.  You may return to the Emergency Department at any time for worsening condition or any new symptoms that concern you.  If there is any possibility that you might be pregnant, please let your health care provider know and discuss this with the pharmacist to ensure medication safety.  Please call the dentist listed above within 48 hours to schedule a close follow up appointment.  If you develop fevers, swelling in your face, difficulty swallowing or breathing, return to the ER immediately for a recheck.   ° ° °Dental Pain °A tooth ache may be caused by cavities (tooth decay). Cavities expose the nerve of the tooth to air and hot or cold temperatures. It may come from an infection or abscess (also called a boil or furuncle) around your tooth. It is also often caused by dental caries (tooth decay). This causes the pain you are having. °DIAGNOSIS  °Your caregiver can diagnose this problem by exam. °TREATMENT  °· If caused by an infection, it may be treated with medications which kill germs (antibiotics) and pain medications as prescribed by your caregiver. Take medications as directed. °· Only take over-the-counter or prescription medicines for pain, discomfort, or fever as directed by your caregiver. °· Whether the tooth ache today is caused by infection or dental disease, you should see your dentist as soon as possible for further care. °SEEK MEDICAL CARE IF: °The exam and treatment you received today has been provided on an emergency basis only. This is not a substitute for complete medical or dental care. If your problem worsens or new problems (symptoms) appear, and you are unable to meet with your dentist, call or return to this location. °SEEK IMMEDIATE MEDICAL CARE IF:  °· You have a  fever. °· You develop redness and swelling of your face, jaw, or neck. °· You are unable to open your mouth. °· You have severe pain uncontrolled by pain medicine. °MAKE SURE YOU:  °· Understand these instructions. °· Will watch your condition. °· Will get help right away if you are not doing well or get worse. °Document Released: 06/28/2005 Document Revised: 09/20/2011 Document Reviewed: 02/14/2008 °ExitCare® Patient Information ©2015 [employer]. This information is not intended to replace advice given to you by your health care provider. Make sure you discuss any questions you have with your health care provider. ° ° ° °Emergency Department Resource Guide °1) Find a Doctor and Pay Out of Pocket °Although you won't have to find out who is covered by your insurance plan, it is a good idea to ask around and get recommendations. You will then need to call the office and see if the doctor you have chosen will accept you as a new patient and what types of options they offer for patients who are self-pay. Some doctors offer discounts or will set up payment plans for their patients who do not have insurance, but you will need to ask so you aren't surprised when you get to your appointment. ° °2) Contact Your Local Health Department °Not all health departments have doctors that can see patients for sick visits, but many do, so it is worth a call to see if yours does. If you don't know where your local health department is, you can check in your phone book. The CDC also has   a tool to help you locate your state's health department, and many state websites also have listings of all of their local health departments. ° °3) Find a Walk-in Clinic °If your illness is not likely to be very severe or complicated, you may want to try a walk in clinic. These are popping up all over the country in pharmacies, drugstores, and shopping centers. They're usually staffed by nurse practitioners or physician assistants that have been  trained to treat common illnesses and complaints. They're usually fairly quick and inexpensive. However, if you have serious medical issues or chronic medical problems, these are probably not your best option. ° °No Primary Care Doctor: °- Call [facility] at  832-8000 - they can help you locate a primary care doctor that  accepts your insurance, provides certain services, etc. °- Physician Referral Service- 1-800-533-3463 ° °Chronic Pain Problems: °Organization         Address  Phone   Notes  °[facility]  (336) 297-2271 Patients need to be referred by their primary care doctor.  ° °Medication Assistance: °Organization         Address  Phone   Notes  °[place] [facility] [address] °[place], [place] [address] (336) 641-8030 --Must be a resident of [place] °-- Must have NO insurance coverage whatsoever (no Medicaid/ Medicare, etc.) °-- The pt. MUST have a primary care doctor that directs their care regularly and follows them in the community °  °[facility]  (866) 331-1348   °[employer]  (888) 892-1162   ° °Agencies that provide inexpensive medical care: °Organization         Address  Phone   Notes  °[facility]  (336) 832-8035   ° Internal Medicine    (336) 832-7272   °[facility] [address] °[place], [place] [address] (336) 832-4777   °[facility] [address], °[place] (336) 271-4999   °[facility]    (336) 373-0678   °[facility]    (336) 272-1050   °[facility] ° [address], [place] Phone:  (336) 832-4444, Fax:  (336) 832-4440 Hours of Operation:  9 am - 6 pm, M-F.  Also accepts Medicaid/Medicare and self-pay.  °[facility] ° [address], [place] Phone: (336) 832-3150, Fax: (336) 832-3151. Hours of Operation:  8:30 am - 5:30 pm, M-F.  Also accepts Medicaid and self-pay.   °[facility] [place] [address], [place] Phone: (336) 878-6027   °[facility] [address], [place], [place] (336)723-1848, Ext. 123 Mondays & Thursdays: 7-9 AM.  First 15 patients are seen on a first come, first serve basis. °  ° °Medicaid-accepting [place] Providers: ° °Organization         Address  Phone   Notes  °[facility] [address], [place] (336) 641-2100 Also accepts self-pay patients.  °[facility] [address], [place] ° (336) 856-9996   °[facility] [address], [place] (336) 288-8857   °[facility] [address] [place] [address], [place] (336) 299-7000   °Veita Bland [address], [place]  ° (336) 373-1557 Only accepts [place] Access Medicaid patients after they have their name applied to their card.  ° °Self-Pay (no insurance) in [place]: ° °Organization         Address  Phone   Notes  °Sickle Cell Patients, [facility] [address], [place] (336) 832-1970   °[facility] [address], [place] (336) 832-4400   °[facility] ° [address], [place] (336) 992-4800   °[facility]/Dr. Osei-Bonsu ° [address] [place] [address], [place] or [address], [place] (336) 841-8500 Phone number for both [place] and [place] locations is the same.  °[facility] [address], [place] (336) 299-0000   °[facility] [address] [place] [address], [place] or [address] (336) 852-7530 °(336) 878-2260   °[facility] [address], [place] (336) 350-1642, phone; (336) 294-5005, fax Sees patients 1st and 3rd Saturday of every month.  Must not qualify for public or private insurance (i.e. Medicaid, Medicare, [place] Health Choice, Veterans' Benefits) • Household income should be no  more than 200% of the poverty level •The clinic cannot treat you if you are pregnant or think you are pregnant • Sexually transmitted diseases are not treated at the clinic.  ° ° °Dental Care: °Organization         Address  Phone  Notes  °[facility] [address], [place] (336) 641-6152 Accepts children up to age 21 who are enrolled in Medicaid or [place] Health Choice; pregnant women with a Medicaid card; and children who have applied for Medicaid or [place] Health Choice, but were declined, whose parents can pay a reduced fee at time of service.  °[facility] [place]  [address], [place] (336) 641-7733 Accepts children up to age 21 who are enrolled in Medicaid or [place] Health Choice; pregnant women with a Medicaid card; and children who have applied for Medicaid or [place] Health Choice, but were declined, whose parents can pay a reduced fee at time of service.  °[facility] ° [address], [place] (336) 641-4533 Patients are seen by appointment only. Walk-ins are not accepted. [facility] will see patients 18 years of age and older. °Monday - Tuesday (8am-5pm) °Most Wednesdays (8:30-5pm) °$30 per visit, cash only  °[facility] ° [address], [place] (336) 641-4533 Patients are seen by appointment only. Walk-ins are not accepted. [facility] will see patients 18 years of age and older. °One Wednesday Evening (Monthly: Volunteer Based).  $30 per visit, cash only  °[facility]  (919) 537-3737 for adults; Children under age 4, call Graduate Pediatric Dentistry at (919) [phone]. Children aged 4-14, please call (919) [phone] to request a pediatric application. ° Dental services are provided in all areas of dental care including fillings, crowns and bridges, complete and partial dentures, implants, gum treatment, root canals,  and extractions. Preventive care is also provided. Treatment is provided to both adults and children. °Patients are selected via a lottery and there is often a waiting list. °  °[facility] [address], ° ° (336) 763-8833 [email or web address] °  °[facility] [address], [place],  (336)723-1848, Ext. 123 Second and Fourth Thursday of each month, opens at 6:30 AM; Clinic ends at 9 AM.  Patients are seen on a first-come first-served basis, and a limited number are seen during each clinic.  ° °  [facility] ° [address], [place], [place] (336) 723-7904   Eligibility Requirements °You must have lived in [place], [place], or [place] counties for at least the last three months. °  You cannot be eligible for state or federal sponsored healthcare insurance, including Veterans Administration, Medicaid, or Medicare. °  You generally cannot be eligible for healthcare insurance through your employer.  °  How to apply: °Eligibility screenings are held every Tuesday and Wednesday afternoon from 1:00 pm until 4:00 pm. You do not need an appointment for the interview!  °[facility] [address], [place], [place] 336-631-2330   °[facility]  336-342-8273   °[facility]  336-703-3100   °[facility]  336-570-6415   ° °Behavioral Health Resources in the Community: °Intensive Outpatient Programs °Organization         Address  Phone  Notes  °[facility] [address], [place], [place] 336-878-6098   °[facility] [address], [place], [place] 336-832-9800   °[facility] [address], [place], [place] ° 336-882-2125   °[facility] [address],  °[place],  [place] 1-800-853-5163 or 336-641-4981   °Substance Abuse Resources °Organization         Address  Phone  Notes  °[facility]  336-882-2125    °[facility]  336-784-9470   °[facility]  336-285-9073   °[facility]  336-845-3988   °Residential & Outpatient Substance Abuse Program  1-800-659-3381   °Psychological Services °Organization         Address  Phone  Notes  °[facility]  336- 832-9600   °[facility]  336- 378-7881   °[facility] [address], [place] 1-800-853-5163 or 336-641-4981   ° °Mobile Crisis Teams °Organization         Address  Phone  Notes  °[facility], Mobile Crisis Care Unit  1-877-626-1772   °Assertive °[facility] ° [address] [place], [place] 336-834-9664   °Sharon DeEsch [address] °[place] 336-554-5454   ° °Self-Help/Support Groups °Organization         Address  Phone             Notes  °[facility] [place] - variety of support groups  336- 373-1402 Call for more information  °Narcotics Anonymous (NA), [facility] [address], °[place]  2 meetings at this location  ° °Residential Treatment Programs °Organization         Address  Phone  Notes  °[facility] [address],    °[place]  1-866-801-8205   °[facility] ° [address], [place], [place] 704-293-8524   °[facility] [address], [place] 336-845-3988 Admissions: 8am-3pm M-F  °[facility] [address]    °[place], [place] 336-841-1104   °[facility] [address], [place], [place] 336-379-7146   °[facility] [address]  °[place], [place] 336-285-9073   °[facility] Intensive Outpatient [address], [place], [place] 336-852-3033   °[facility] [address]  °[place], [place] 1-877-615-2722 or 336-784-9470   °[facility] [address] [place], [place] 336-227-7417 Accepts Medicaid  °[facility] [address]   °[place]  1-800-659-3381 Substance Abuse/Addiction   Treatment  ° °[place] Behavioral Health Resources °Organization         Address  Phone  Notes  °[facility]  (888) 581-9988   °Julie Brannon, PhD [address] [place], [place]   (336) 349-5553 or (336) 951-0000   °[facility]   [address] °[place], [place] (336) 349-4454   °[facility] [address], [place], [place] (336) 342-8316 Insurance/Medicaid/sponsorship through [facility]  °[facility] [address]                                    [place], [place] (336) 342-8316 Therapy/tele-psych/case  °[facility] [address]  ° [place], [place] (336) 349-2233    °Dr. Arfeen  (336) 349-4544   °[facility] [place]  [employer] [place] [facility] 1) [address],  °2) [address], [place] °3)  [address], [place] (336) 349-3220 °(336) 342-7768 ° °(336) 342-8140   °[place] [facility] (336) 342-1394 or (336) 342-3537 (After Hours)    ° ° ° °

## 2014-07-10 NOTE — ED Notes (Signed)
Pt c/o of 10/10 RT sided dental pain. Pt also c/o of generalized weakness, HA, and nausea. Denies fever. Pt tearful on assessment. VSS.

## 2014-07-10 NOTE — ED Provider Notes (Signed)
CSN: [id number]     Arrival date & time 07/10/14  2102 History   First MD Initiated Contact with Patient 07/10/14 2224     Chief Complaint  Patient presents with  . Dental Pain     (Consider location/radiation/quality/duration/timing/severity/associated sxs/prior Treatment) HPI   Pt with known dental caries presents with 1 week of right upper molar (2nd molar) pain x 1 week.  The pain is throbbing, sharp, 10/10, radiates to right ear.  Denies fevers, sore throat, difficulty swallowing or breathing, vomiting.  Has been taking ibuprofen without improvement.  Lost her medicaid and has been unable to follow up with her dentist.   Past Medical History  Diagnosis Date  . Anemia   . Acne   . Chlamydia    Past Surgical History  Procedure Laterality Date  . Cesarean section    . Dilation and curettage of uterus     Family History  Problem Relation Age of Onset  . Adopted: Yes  . Other Neg Hx    History  Substance Use Topics  . Smoking status: Never Smoker   . Smokeless tobacco: Never Used  . Alcohol Use: No   OB History    Gravida Para Term Preterm AB TAB SAB Ectopic Multiple Living   4 3 3  0 1 1 0 0 0 3     Review of Systems  Constitutional: Negative for fever and chills.  HENT: Positive for dental problem. Negative for facial swelling, sore throat and trouble swallowing.   Respiratory: Negative for shortness of breath.   Gastrointestinal: Positive for nausea. Negative for vomiting.  Skin: Negative for color change.  Allergic/Immunologic: Negative for immunocompromised state.      Allergies  Review of patient's allergies indicates no known allergies.  Home Medications   Prior to Admission medications   Medication Sig Start Date End Date Taking? Authorizing Provider  etonogestrel-ethinyl estradiol (NUVARING) 0.12-0.015 MG/24HR vaginal ring Insert vaginally and leave in place for 3 consecutive weeks, then remove for 1 week. 09/24/13  Yes Brock Badharles A Harper, MD    Multiple Vitamin (MULTIVITAMIN WITH MINERALS) TABS tablet Take 1 tablet by mouth daily.   Yes Historical Provider, MD  cyclobenzaprine (FLEXERIL) 10 MG tablet Take 1 tablet (10 mg total) by mouth 3 (three) times daily as needed for muscle spasms. Patient not taking: Reported on 07/10/2014 07/15/13   Trixie DredgeEmily Roe Koffman, PA-C  HYDROcodone-acetaminophen (NORCO/VICODIN) 5-325 MG per tablet Take 1 tablet by mouth every 4 (four) hours as needed for moderate pain or severe pain. 07/10/14   Trixie DredgeEmily Emmelia Holdsworth, PA-C  ibuprofen (ADVIL,MOTRIN) 800 MG tablet Take 1 tablet (800 mg total) by mouth every 8 (eight) hours as needed for mild pain or moderate pain. 07/10/14   Trixie DredgeEmily Alizee Maple, PA-C  penicillin v potassium (VEETID) 500 MG tablet Take 1 tablet (500 mg total) by mouth 4 (four) times daily. 07/10/14 07/17/14  Trixie DredgeEmily Willowdean Luhmann, PA-C   BP 116/65 mmHg  Pulse 73  Temp(Src) 98.2 F (36.8 C)  Resp 17  SpO2 100%  LMP 05/16/2014 Physical Exam  Constitutional: She appears well-developed and well-nourished. No distress.  HENT:  Head: Normocephalic and atraumatic.  Mouth/Throat: Uvula is midline and oropharynx is clear and moist. Mucous membranes are not dry. No uvula swelling. No oropharyngeal exudate, posterior oropharyngeal edema, posterior oropharyngeal erythema or tonsillar abscesses.    Eyes: Conjunctivae are normal.  Neck: Trachea normal, normal range of motion and full passive range of motion without pain. Neck supple. No tracheal tenderness present. No rigidity. No  tracheal deviation, no edema, no erythema and normal range of motion present.  Cardiovascular: Normal rate and regular rhythm.   Pulmonary/Chest: Effort normal and breath sounds normal. No stridor. No respiratory distress. She has no wheezes. She has no rales.  Neurological: She is alert.  Skin: She is not diaphoretic.  Nursing note and vitals reviewed.   ED Course  Procedures (including critical care time) Labs Review Labs Reviewed - No data to  display  Imaging Review No results found.   EKG Interpretation None      MDM   Final diagnoses:  Pain, dental   Afebrile, nontoxic patient with new dental pain.  No obvious abscess.  No concerning findings on exam.  Doubt deep space head or neck infection.  Doubt Ludwig's angina.  D/C home with antibiotic, pain medication and dental follow up.  Discussed findings, treatment, and follow up  with patient.  Pt given return precautions.  Pt verbalizes understanding and agrees with plan.         Trixie Dredgemily Nyasia Baxley, PA-C 07/10/14 2254  Tilden FossaElizabeth Rees, MD 07/11/14 Marlyne Beards0002

## 2014-11-14 ENCOUNTER — Ambulatory Visit: Payer: Medicaid Other | Admitting: Obstetrics

## 2014-11-19 ENCOUNTER — Emergency Department (HOSPITAL_COMMUNITY)
Admission: EM | Admit: 2014-11-19 | Discharge: 2014-11-19 | Disposition: A | Discharge status: Home / Self Care | Payer: 59 | Attending: Emergency Medicine | Admitting: Emergency Medicine

## 2014-11-19 ENCOUNTER — Encounter (HOSPITAL_COMMUNITY): Payer: Self-pay | Admitting: Cardiology

## 2014-11-19 ENCOUNTER — Emergency Department (HOSPITAL_COMMUNITY): Payer: 59

## 2014-11-19 DIAGNOSIS — J0100 Acute maxillary sinusitis, unspecified: Secondary | ICD-10-CM | POA: Diagnosis not present

## 2014-11-19 DIAGNOSIS — R509 Fever, unspecified: Secondary | ICD-10-CM

## 2014-11-19 DIAGNOSIS — Z872 Personal history of diseases of the skin and subcutaneous tissue: Secondary | ICD-10-CM | POA: Diagnosis not present

## 2014-11-19 DIAGNOSIS — Z862 Personal history of diseases of the blood and blood-forming organs and certain disorders involving the immune mechanism: Secondary | ICD-10-CM | POA: Diagnosis not present

## 2014-11-19 DIAGNOSIS — Z79899 Other long term (current) drug therapy: Secondary | ICD-10-CM | POA: Insufficient documentation

## 2014-11-19 DIAGNOSIS — R05 Cough: Secondary | ICD-10-CM

## 2014-11-19 DIAGNOSIS — R059 Cough, unspecified: Secondary | ICD-10-CM

## 2014-11-19 DIAGNOSIS — Z8619 Personal history of other infectious and parasitic diseases: Secondary | ICD-10-CM | POA: Insufficient documentation

## 2014-11-19 MED ORDER — DEXTROMETHORPHAN POLISTIREX ER 30 MG/5ML PO SUER: 30.0000 mg | ORAL | Status: DC | PRN

## 2014-11-19 MED ORDER — AMOXICILLIN 500 MG PO CAPS: 500.0000 mg | ORAL_CAPSULE | Freq: Three times a day (TID) | ORAL | Status: DC

## 2014-11-19 NOTE — ED Provider Notes (Signed)
CSN: [id number]     Arrival date & time 11/19/14  1304 History  This chart was scribed for Nicole BeckKaitlyn Pualani Borah, PA-C working with Bethann BerkshireJoseph Zammit, MD by Evon Slackerrance Branch, ED Scribe. This patient was seen in room TR06C/TR06C and the patient's care was started at 2:15 PM.     Chief Complaint  Patient presents with  . Cough  . Fever   The history is provided by the patient. No language interpreter was used.   HPI Comments: [name] is a 29 y.o. female who presents to the Emergency Department complaining of cough onset 4 days prior. Pt has associated chills, congestion, sinus pressure, sore throat and fatigue. Pt reports resolved fever as well. Pt states that the sore throat is worse when coughing. Pt states she has tried OTC medications with no relief. Pt denies any other symptoms.   Past Medical History  Diagnosis Date  . Anemia   . Acne   . Chlamydia    Past Surgical History  Procedure Laterality Date  . Cesarean section    . Dilation and curettage of uterus     Family History  Problem Relation Age of Onset  . Adopted: Yes  . Other Neg Hx    History  Substance Use Topics  . Smoking status: Never Smoker   . Smokeless tobacco: Never Used  . Alcohol Use: No   OB History    Gravida Para Term Preterm AB TAB SAB Ectopic Multiple Living   4 3 3  0 1 1 0 0 0 3      Review of Systems  Constitutional: Positive for chills and fatigue. Negative for fever.  HENT: Positive for congestion, sinus pressure and sore throat.   Respiratory: Positive for cough.   All other systems reviewed and are negative.     Allergies  Review of patient's allergies indicates no known allergies.  Home Medications   Prior to Admission medications   Medication Sig Start Date End Date Taking? Authorizing Provider  cyclobenzaprine (FLEXERIL) 10 MG tablet Take 1 tablet (10 mg total) by mouth 3 (three) times daily as needed for muscle spasms. Patient not taking: Reported on 07/10/2014 07/15/13   Trixie DredgeEmily  West, PA-C  etonogestrel-ethinyl estradiol (NUVARING) 0.12-0.015 MG/24HR vaginal ring Insert vaginally and leave in place for 3 consecutive weeks, then remove for 1 week. 09/24/13   Brock Badharles A Harper, MD  HYDROcodone-acetaminophen (NORCO/VICODIN) 5-325 MG per tablet Take 1 tablet by mouth every 4 (four) hours as needed for moderate pain or severe pain. 07/10/14   Trixie DredgeEmily West, PA-C  ibuprofen (ADVIL,MOTRIN) 800 MG tablet Take 1 tablet (800 mg total) by mouth every 8 (eight) hours as needed for mild pain or moderate pain. 07/10/14   Trixie DredgeEmily West, PA-C  Multiple Vitamin (MULTIVITAMIN WITH MINERALS) TABS tablet Take 1 tablet by mouth daily.    Historical Provider, MD   BP 140/94 mmHg  Pulse 80  Temp(Src) 98.2 F (36.8 C) (Oral)  Resp 15  SpO2 98%  LMP 10/25/2014   Physical Exam  Constitutional: She is oriented to person, place, and time. She appears well-developed and well-nourished. No distress.  HENT:  Head: Normocephalic and atraumatic.  Nose: Right sinus exhibits maxillary sinus tenderness and frontal sinus tenderness. Left sinus exhibits maxillary sinus tenderness and frontal sinus tenderness.  Mouth/Throat: Oropharynx is clear and moist.  Eyes: Conjunctivae and EOM are normal.  Neck: Neck supple. No tracheal deviation present.  Cardiovascular: Normal rate.   Pulmonary/Chest: Effort normal and breath sounds normal. No respiratory  distress.  Abdominal: Soft. She exhibits no distension. There is no tenderness.  Musculoskeletal: Normal range of motion.  Neurological: She is alert and oriented to person, place, and time. Coordination normal.  Skin: Skin is warm and dry.  Psychiatric: She has a normal mood and affect. Her behavior is normal.  Nursing note and vitals reviewed.   ED Course  Procedures (including critical care time) DIAGNOSTIC STUDIES: Oxygen Saturation is 98% on RA, normal by my interpretation.    COORDINATION OF CARE: 2:43 PM-Discussed treatment plan with pt at bedside  and pt agreed to plan.     Labs Review Labs Reviewed - No data to display  Imaging Review Dg Chest 2 View  11/19/2014   CLINICAL DATA:  Four day history of cough and fever  EXAM: CHEST  2 VIEW  COMPARISON:  February 20, 2007  FINDINGS: Lungs are clear. Heart size and pulmonary vascularity are normal. No adenopathy. No bone lesions.  IMPRESSION: No edema or consolidation.   Electronically Signed   By: Bretta BangWilliam  Woodruff III M.D.   On: 11/19/2014 14:19     EKG Interpretation None      MDM   Final diagnoses:  Cough  Fever  Acute maxillary sinusitis, recurrence not specified   2:46 PM Patient's xray unremarkable for acute changes. Patient will be treated for sinusitis with amoxicillin and delsym for cough,. Vitals stable and patient afebrile.    I personally performed the services described in this documentation, which was scribed in my presence. The recorded information has been reviewed and is accurate.      Nicole BeckKaitlyn Akane Tessier, PA-C 11/19/14 1448  Bethann BerkshireJoseph Zammit, MD 11/20/14 [phone]

## 2014-11-19 NOTE — Discharge Instructions (Signed)
Take amoxicillin as directed until gone. Take delsym as needed for cough. Refer to attached documents for more information.  °

## 2014-11-19 NOTE — ED Notes (Signed)
Reports that she has had a cough, fever and chills since Friday. States she has tried OTC meds with no relief.

## 2014-11-28 ENCOUNTER — Encounter (HOSPITAL_COMMUNITY): Payer: Self-pay | Admitting: Emergency Medicine

## 2014-11-28 ENCOUNTER — Emergency Department (HOSPITAL_COMMUNITY)
Admission: EM | Admit: 2014-11-28 | Discharge: 2014-11-28 | Disposition: A | Discharge status: Home / Self Care | Payer: 59 | Attending: Emergency Medicine | Admitting: Emergency Medicine

## 2014-11-28 DIAGNOSIS — L239 Allergic contact dermatitis, unspecified cause: Secondary | ICD-10-CM | POA: Insufficient documentation

## 2014-11-28 DIAGNOSIS — Z862 Personal history of diseases of the blood and blood-forming organs and certain disorders involving the immune mechanism: Secondary | ICD-10-CM | POA: Insufficient documentation

## 2014-11-28 DIAGNOSIS — Z792 Long term (current) use of antibiotics: Secondary | ICD-10-CM | POA: Diagnosis not present

## 2014-11-28 DIAGNOSIS — R21 Rash and other nonspecific skin eruption: Secondary | ICD-10-CM | POA: Diagnosis present

## 2014-11-28 DIAGNOSIS — Z8619 Personal history of other infectious and parasitic diseases: Secondary | ICD-10-CM | POA: Diagnosis not present

## 2014-11-28 DIAGNOSIS — Z872 Personal history of diseases of the skin and subcutaneous tissue: Secondary | ICD-10-CM | POA: Insufficient documentation

## 2014-11-28 MED ORDER — PREDNISONE 20 MG PO TABS: 40.0000 mg | ORAL_TABLET | Freq: Every day | ORAL | Status: DC

## 2014-11-28 NOTE — ED Provider Notes (Signed)
CSN: [id number]     Arrival date & time 11/28/14  1135 History   This chart was scribed for non-physician practitioner working, Arthor CaptainAbigail Ravis Herne, PA-C, with Benjiman CoreNathan Pickering, MD, by Modena JanskyAlbert Thayil, ED Scribe. This patient was seen in room TR08C/TR08C and the patient's care was started at 1:04 PM.   Chief Complaint  Patient presents with  . Rash   The history is provided by the patient. No language interpreter was used.   HPI Comments: [name] is a 29 y.o. female who presents to the Emergency Department complaining of constant moderate rash that started yesterday. She states that she started having an itching rash to the edges of her lips that has worsened today. She reports that she ate a mango for the first time yesterday and the fruit juices got on her lips. She reports that she took benadryl without any relief. She denies any use of new products or any prior hx of similar rash. She denies any pain for the rash, along with speech difficulty, stridor, or throat swelling.    Past Medical History  Diagnosis Date  . Anemia   . Acne   . Chlamydia    Past Surgical History  Procedure Laterality Date  . Cesarean section    . Dilation and curettage of uterus     Family History  Problem Relation Age of Onset  . Adopted: Yes  . Other Neg Hx    History  Substance Use Topics  . Smoking status: Never Smoker   . Smokeless tobacco: Never Used  . Alcohol Use: No   OB History    Gravida Para Term Preterm AB TAB SAB Ectopic Multiple Living   4 3 3  0 1 1 0 0 0 3     Review of Systems  Respiratory: Negative for stridor.   Skin: Positive for rash.  Neurological: Negative for speech difficulty.     Allergies  Review of patient's allergies indicates no known allergies.  Home Medications   Prior to Admission medications   Medication Sig Start Date End Date Taking? Authorizing Provider  amoxicillin (AMOXIL) 500 MG capsule Take 1 capsule (500 mg total) by mouth 3 (three) times daily.  11/19/14   Kaitlyn Szekalski, PA-C  cyclobenzaprine (FLEXERIL) 10 MG tablet Take 1 tablet (10 mg total) by mouth 3 (three) times daily as needed for muscle spasms. Patient not taking: Reported on 07/10/2014 07/15/13   Trixie DredgeEmily West, PA-C  dextromethorphan (DELSYM) 30 MG/5ML liquid Take 5 mLs (30 mg total) by mouth as needed for cough. 11/19/14   Emilia BeckKaitlyn Szekalski, PA-C  etonogestrel-ethinyl estradiol (NUVARING) 0.12-0.015 MG/24HR vaginal ring Insert vaginally and leave in place for 3 consecutive weeks, then remove for 1 week. 09/24/13   Brock Badharles A Harper, MD  HYDROcodone-acetaminophen (NORCO/VICODIN) 5-325 MG per tablet Take 1 tablet by mouth every 4 (four) hours as needed for moderate pain or severe pain. 07/10/14   Trixie DredgeEmily West, PA-C  ibuprofen (ADVIL,MOTRIN) 800 MG tablet Take 1 tablet (800 mg total) by mouth every 8 (eight) hours as needed for mild pain or moderate pain. 07/10/14   Trixie DredgeEmily West, PA-C  Multiple Vitamin (MULTIVITAMIN WITH MINERALS) TABS tablet Take 1 tablet by mouth daily.    Historical Provider, MD   BP 118/73 mmHg  Pulse 75  Temp(Src) 98.3 F (36.8 C) (Oral)  Resp 18  Ht 5\' 4"  (1.626 m)  Wt 215 lb (97.523 kg)  BMI 36.89 kg/m2  SpO2 97%  LMP 10/25/2014 Physical Exam  Constitutional: She is oriented  to person, place, and time. She appears well-developed and well-nourished. No distress.  HENT:  Head: Normocephalic and atraumatic.  Neck: Neck supple. No tracheal deviation present.  Cardiovascular: Normal rate.   Pulmonary/Chest: Effort normal. No respiratory distress. She has no wheezes.  No stridor.   Musculoskeletal: Normal range of motion.  Neurological: She is alert and oriented to person, place, and time.  Skin: Skin is warm and dry. Rash noted.  Fine papular eruption on the face involving the lips and surrounding nasal bridge.  Psychiatric: She has a normal mood and affect. Her behavior is normal.  Nursing note and vitals reviewed.   ED Course  Procedures (including  critical care time) DIAGNOSTIC STUDIES: Oxygen Saturation is 97% on RA, normal by my interpretation.    COORDINATION OF CARE: 1:08 PM- Pt advised of plan for treatment and pt agrees.  Labs Review Labs Reviewed - No data to display  Imaging Review No results found.   EKG Interpretation None      MDM   Final diagnoses:  Allergic dermatitis    Patient with apparent allergic dermatitis. She will be treated with steroid and Benadryl. No airway involvement. This appears to be a localized skin reaction. She appears safe for discharge at this time. I personally performed the services described in this documentation, which was scribed in my presence. The recorded information has been reviewed and is accurate.       Arthor Captainbigail Pauline Pegues, PA-C 12/10/14 1955  Benjiman CoreNathan Pickering, MD 12/11/14 [phone]

## 2014-11-28 NOTE — ED Notes (Signed)
PA at the bedside.

## 2014-11-28 NOTE — ED Notes (Signed)
Pt c/o fine rash at the edges of her lips that "tingle". Onset yesterday. Has been taking Benadryl. Pt has pimples on face which she says is normal. States she has "very sensitive skin".

## 2014-11-28 NOTE — Discharge Instructions (Signed)

## 2014-11-28 NOTE — ED Notes (Signed)
Pt has breakouts and itching and tingling around lips started yesterday. Benadryl OTC taken; woke up worst. Denies lips are any larger than normal.

## 2015-05-02 ENCOUNTER — Encounter (HOSPITAL_COMMUNITY): Payer: Self-pay

## 2015-05-02 ENCOUNTER — Emergency Department (HOSPITAL_COMMUNITY)
Admission: EM | Admit: 2015-05-02 | Discharge: 2015-05-02 | Disposition: A | Discharge status: Home / Self Care | Payer: 59 | Attending: Emergency Medicine | Admitting: Emergency Medicine

## 2015-05-02 DIAGNOSIS — Z8619 Personal history of other infectious and parasitic diseases: Secondary | ICD-10-CM | POA: Insufficient documentation

## 2015-05-02 DIAGNOSIS — Z79899 Other long term (current) drug therapy: Secondary | ICD-10-CM | POA: Insufficient documentation

## 2015-05-02 DIAGNOSIS — Z792 Long term (current) use of antibiotics: Secondary | ICD-10-CM | POA: Insufficient documentation

## 2015-05-02 DIAGNOSIS — Z862 Personal history of diseases of the blood and blood-forming organs and certain disorders involving the immune mechanism: Secondary | ICD-10-CM | POA: Insufficient documentation

## 2015-05-02 DIAGNOSIS — Z872 Personal history of diseases of the skin and subcutaneous tissue: Secondary | ICD-10-CM | POA: Insufficient documentation

## 2015-05-02 DIAGNOSIS — Z7952 Long term (current) use of systemic steroids: Secondary | ICD-10-CM | POA: Insufficient documentation

## 2015-05-02 DIAGNOSIS — K029 Dental caries, unspecified: Secondary | ICD-10-CM

## 2015-05-02 MED ORDER — DOXYCYCLINE HYCLATE 100 MG PO CAPS: 100.0000 mg | ORAL_CAPSULE | Freq: Two times a day (BID) | ORAL | Status: DC

## 2015-05-02 MED ORDER — NAPROXEN 500 MG PO TABS: 500.0000 mg | ORAL_TABLET | Freq: Two times a day (BID) | ORAL | Status: DC | PRN

## 2015-05-02 NOTE — ED Notes (Signed)
Pt stable, ambulatory, states understanding of discharge instructions 

## 2015-05-02 NOTE — ED Notes (Signed)
Pt arrived POV c/o right upper tooth pain

## 2015-05-02 NOTE — Discharge Instructions (Signed)
Apply warm compresses to jaw throughout the day. Take antibiotic until finished. Take tylenol and naprosyn as directed, as needed for pain. Followup with a dentist is very important for ongoing evaluation and management of recurrent dental pain. Use the list below to find a dentist. Return to emergency department for emergent changing or worsening symptoms.   Dental Caries Dental caries is tooth decay. This decay can cause a hole in teeth (cavity) that can get bigger and deeper over time. HOME CARE  Brush and floss your teeth. Do this at least two times a day.  Use a fluoride toothpaste.  Use a mouth rinse if told by your dentist or doctor.  Eat less sugary and starchy foods. Drink less sugary drinks.  Avoid snacking often on sugary and starchy foods. Avoid sipping often on sugary drinks.  Keep regular checkups and cleanings with your dentist.  Use fluoride supplements if told by your dentist or doctor.  Allow fluoride to be applied to teeth if told by your dentist or doctor.   This information is not intended to replace advice given to you by your health care provider. Make sure you discuss any questions you have with your health care provider.   Document Released: 04/06/2008 Document Revised: 07/19/2014 Document Reviewed: 06/30/2012 Elsevier Interactive Patient Education 2016 Elsevier Inc.  Dental Pain Dental pain may be caused by many things, including:  Tooth decay (cavities or caries). Cavities expose the nerve of your tooth to air and hot or cold temperatures. This can cause pain or discomfort.  Abscess or infection. A dental abscess is a collection of infected pus from a bacterial infection in the inner part of the tooth (pulp). It usually occurs at the end of the tooth's root.  Injury.  An unknown reason (idiopathic). Your pain may be mild or severe. It may only occur when:  You are chewing.  You are exposed to hot or cold temperature.  You are eating or drinking  sugary foods or beverages, such as soda or candy. Your pain may also be constant. HOME CARE INSTRUCTIONS Watch your dental pain for any changes. The following actions may help to lessen any discomfort that you are feeling:  Take medicines only as directed by your dentist.  If you were prescribed an antibiotic medicine, finish all of it even if you start to feel better.  Keep all follow-up visits as directed by your dentist. This is important.  Do not apply heat to the outside of your face.  Rinse your mouth or gargle with salt water if directed by your dentist. This helps with pain and swelling.  You can make salt water by adding  tsp of salt to 1 cup of warm water.  Apply ice to the painful area of your face:  Put ice in a plastic bag.  Place a towel between your skin and the bag.  Leave the ice on for 20 minutes, 2-3 times per day.  Avoid foods or drinks that cause you pain, such as:  Very hot or very cold foods or drinks.  Sweet or sugary foods or drinks. SEEK MEDICAL CARE IF:  Your pain is not controlled with medicines.  Your symptoms are worse.  You have new symptoms. SEEK IMMEDIATE MEDICAL CARE IF:  You are unable to open your mouth.  You are having trouble breathing or swallowing.  You have a fever.  Your face, neck, or jaw is swollen.   This information is not intended to replace advice given to you  by your health care provider. Make sure you discuss any questions you have with your health care provider.   Document Released: 06/28/2005 Document Revised: 11/12/2014 Document Reviewed: 06/24/2014 Elsevier Interactive Patient Education 2016 [employer].    Emergency Department Resource Guide 1) Find a Doctor and Pay Out of Pocket Although you won't have to find out who is covered by your insurance plan, it is a good idea to ask around and get recommendations. You will then need to call the office and see if the doctor you have chosen will accept you as a  new patient and what types of options they offer for patients who are self-pay. Some doctors offer discounts or will set up payment plans for their patients who do not have insurance, but you will need to ask so you aren't surprised when you get to your appointment.  2) Contact Your Local Health Department Not all health departments have doctors that can see patients for sick visits, but many do, so it is worth a call to see if yours does. If you don't know where your local health department is, you can check in your phone book. The CDC also has a tool to help you locate your state's health department, and many state websites also have listings of all of their local health departments.  3) Find a Walk-in Clinic If your illness is not likely to be very severe or complicated, you may want to try a walk in clinic. These are popping up all over the country in pharmacies, drugstores, and shopping centers. They're usually staffed by nurse practitioners or physician assistants that have been trained to treat common illnesses and complaints. They're usually fairly quick and inexpensive. However, if you have serious medical issues or chronic medical problems, these are probably not your best option.  No Primary Care Doctor: - Call Health Connect at  [phone] - they can help you locate a primary care doctor that  accepts your insurance, provides certain services, etc. - Physician Referral Service- [phone]  Chronic Pain Problems: Organization         Address  Phone   Notes  [facility]  [phone] Patients need to be referred by their primary care doctor.   Medication Assistance: Organization         Address  Phone   Notes  [facility] Medication [facility] [address] [place] [place], [place] [address] [phone] --Must be a resident of [facility] -- Must have NO insurance coverage whatsoever (no Medicaid/ Medicare, etc.) -- The pt. MUST have a  primary care doctor that directs their care regularly and follows them in the community   [facility]  [phone]   [place]  [phone]     Dental Care: Organization         Address  Phone  Notes  [facility] Department of [facility] [address] [place], [place] [phone] Accepts children up to age 73 who are enrolled in [place] or [place] Health Choice; pregnant women with a Medicaid card; and children who have applied for Medicaid or [place] Health Choice, but were declined, whose parents can pay a reduced fee at time of service.  [facility] Department of [facility]  [address], [place] [phone] Accepts children up to age 39 who are enrolled in [place] or [place] Health Choice; pregnant women with a Medicaid card; and children who have applied for  Medicaid or [place] Health Choice, but were declined, whose parents can pay a reduced fee at time of service.  [facility]  [address], [place] [phone] Patients are seen by appointment only. Walk-ins are not accepted. [facility] will see patients 29 years of age and older. Monday - Tuesday (8am-5pm) Most Wednesdays (8:30-5pm) $30 per visit, cash only  [facility]  [address], [facility] [phone] Patients are seen by appointment only. Walk-ins are not accepted. [facility] will see patients 29 years of age and older. One Wednesday Evening (Monthly: Volunteer Based).  $30 per visit, cash only  [facility]  [phone] for adults; Children under age 384, call Graduate Pediatric Dentistry at [phone]. Children aged 634-14, please call [phone] to request a pediatric application.  Dental services are provided in all areas of dental care including fillings, crowns and bridges, complete and partial dentures, implants, gum treatment, root canals, and extractions.  Preventive care is also provided. Treatment is provided to both adults and children. Patients are selected via a lottery and there is often a waiting list.   [facility] [address], [place]  [phone] [email or web address]   [facility] [address], [place], [place] [phone], Ext. [phone] Second and Fourth Thursday of each month, opens at 6:30 AM; Clinic ends at 9 AM.  Patients are seen on a first-come first-served basis, and a limited number are seen during each clinic.   [facility]  [address], [place], [place] [phone]   Eligibility Requirements You must have lived in [place], [place], or [place] counties for at least the last three months.   You cannot be eligible for state or federal sponsored [place] insurance, including [employer], [place], or [employer]icare.   You generally cannot be eligible for healthcare insurance through your employer.    How to apply: Eligibility screenings are held every Tuesday and Wednesday afternoon from 1:00 pm until 4:00 pm. You do not need an appointment for the interview!  [facility] [address], [place], [place] [phone]   [facility]  [phone]   [facility]  [phone]   [facility]  [phone]

## 2015-05-02 NOTE — ED Provider Notes (Signed)
CSN: [id number]     Arrival date & time 05/02/15  1957 History  By signing my name below, I, Emmanuella Mensah, attest that this documentation has been prepared under the direction and in the presence of Javien Tesch Camprubi-Soms, PA. Electronically Signed: Angelene Giovanni, ED Scribe. 05/02/2015. 8:31 PM.     No chief complaint on file.  Patient is a 29 y.o. female presenting with tooth pain. The history is provided by the patient. No language interpreter was used.  Dental Pain Location:  Upper Upper teeth location:  2/RU 2nd molar Quality:  Throbbing Severity:  Moderate Onset quality:  Gradual Duration:  3 days Timing:  Constant Progression:  Worsening Chronicity:  Recurrent Context: not dental fracture   Relieved by:  NSAIDs Worsened by:  Touching Ineffective treatments:  None tried Associated symptoms: no difficulty swallowing, no drooling, no facial swelling, no fever, no gum swelling, no neck pain, no neck swelling, no oral bleeding and no trismus   Risk factors: lack of dental care   Risk factors: no smoking    HPI Comments: [name] is a 29 y.o. female who presents to the Emergency Department complaining of 10/10 constant throbbing gradually worsening nonradiating right upper dental pain worse when she touches her tooth together onset 3 days ago, mildly relieved by  ibuprofen. She denies any facial swelling, gum swelling/drainage, drooling, trismus, neck swelling, ear pain/drainage, fever, chills, CP, SOB, abdominal pain, n/v/d/c, dysuria, hematuria, numbness, tingling, or weakness. She denies smoking. She has not had dental coverage since losing medicaid last year. She has had issues with the same tooth in the past. No known dental fracture.  Past Medical History  Diagnosis Date  . Anemia   . Acne   . Chlamydia    Past Surgical History  Procedure Laterality Date  . Cesarean section    . Dilation and curettage of uterus     Family History  Problem Relation  Age of Onset  . Adopted: Yes  . Other Neg Hx    Social History  Substance Use Topics  . Smoking status: Never Smoker   . Smokeless tobacco: Never Used  . Alcohol Use: No   OB History    Gravida Para Term Preterm AB TAB SAB Ectopic Multiple Living   0 1 1 0 0 0 3     Review of Systems  Constitutional: Negative for fever and chills.  HENT: Positive for dental problem. Negative for drooling, ear discharge, ear pain, facial swelling, rhinorrhea, sore throat and trouble swallowing.   Eyes: Negative for visual disturbance.  Respiratory: Negative for shortness of breath.   Cardiovascular: Negative for chest pain.  Gastrointestinal: Negative for nausea, vomiting, abdominal pain, diarrhea and constipation.  Genitourinary: Negative for dysuria and hematuria.  Musculoskeletal: Negative for myalgias, arthralgias and neck pain.  Skin: Negative for color change.  Allergic/Immunologic: Negative for immunocompromised state.  Neurological: Negative for weakness and numbness.  Psychiatric/Behavioral: Negative for confusion.   10 Systems reviewed and are negative for acute change except as noted in the HPI.    Allergies  Review of patient's allergies indicates no known allergies.  Home Medications   Prior to Admission medications   Medication Sig Start Date End Date Taking? Authorizing Provider  amoxicillin (AMOXIL) 500 MG capsule Take 1 capsule (500 mg total) by mouth 3 (three) times daily. 11/19/14   Kaitlyn Szekalski, PA-C  cyclobenzaprine (FLEXERIL) 10 MG tablet Take 1 tablet (10 mg total) by mouth 3 (three) times daily as needed  for muscle spasms. Patient not taking: Reported on 07/10/2014 07/15/13   Trixie Dredge, PA-C  dextromethorphan (DELSYM) 30 MG/5ML liquid Take 5 mLs (30 mg total) by mouth as needed for cough. 11/19/14   Emilia Beck, PA-C  etonogestrel-ethinyl estradiol (NUVARING) 0.12-0.015 MG/24HR vaginal ring Insert vaginally and leave in place for 3 consecutive weeks,  then remove for 1 week. 09/24/13   Brock Bad, MD  HYDROcodone-acetaminophen (NORCO/VICODIN) 5-325 MG per tablet Take 1 tablet by mouth every 4 (four) hours as needed for moderate pain or severe pain. 07/10/14   Trixie Dredge, PA-C  ibuprofen (ADVIL,MOTRIN) 800 MG tablet Take 1 tablet (800 mg total) by mouth every 8 (eight) hours as needed for mild pain or moderate pain. 07/10/14   Trixie Dredge, PA-C  Multiple Vitamin (MULTIVITAMIN WITH MINERALS) TABS tablet Take 1 tablet by mouth daily.    Historical Provider, MD  predniSONE (DELTASONE) 20 MG tablet Take 2 tablets (40 mg total) by mouth daily. 11/28/14   Abigail Harris, PA-C   BP 122/75 mmHg  Pulse 68  Temp(Src) 97.9 F (36.6 C) (Oral)  Resp 16  SpO2 98% Physical Exam  Constitutional: She is oriented to person, place, and time. Vital signs are normal. She appears well-developed and well-nourished.  Non-toxic appearance. No distress.  Afebrile, nontoxic, NAD  HENT:  Head: Normocephalic and atraumatic.  Nose: Nose normal.  Mouth/Throat: Uvula is midline, oropharynx is clear and moist and mucous membranes are normal. No trismus in the jaw. Dental caries present. No dental abscesses or uvula swelling.    Right upper molar #2 with TTP without any surrounding gingival erythema or swelling, no obvious abscess. Nose clear. Oropharynx clear and moist, without uvular swelling or deviation, no trismus or drooling, no tonsillar swelling or erythema, no exudates.    Eyes: Conjunctivae and EOM are normal. Right eye exhibits no discharge. Left eye exhibits no discharge.  Neck: Normal range of motion. Neck supple.  Cardiovascular: Normal rate.   Pulmonary/Chest: Effort normal. No respiratory distress.  Abdominal: Normal appearance. She exhibits no distension.  Musculoskeletal: Normal range of motion.  Neurological: She is alert and oriented to person, place, and time. She has normal strength. No sensory deficit.  Skin: Skin is warm, dry and intact. No  rash noted.  Psychiatric: She has a normal mood and affect. Her behavior is normal.  Nursing note and vitals reviewed.   ED Course  Procedures (including critical care time) DIAGNOSTIC STUDIES:  COORDINATION OF CARE: 8:28 PM- Pt advised of plan for treatment and pt agrees. Will be placed on Antibiotics. Pt advised to alternate Tylenol and prescription strength Ibuprofen. Recommended to follow up with a Dentist for possible tooth extraction. Will provide list of Dentist to contact.    EKG Interpretation None      MDM   Final diagnoses:  Pain due to dental caries    29 y.o. female here with Dental pain associated with dental caries and possible dental infection with patient afebrile, non toxic appearing and swallowing secretions well. I gave patient referral to dentist and stressed the importance of dental follow up for ultimate management of dental pain.  I have also discussed reasons to return immediately to the ER.  Patient expresses understanding and agrees with plan.  I will also give doxycycline and naprosyn.    I personally performed the services described in this documentation, which was scribed in my presence. The recorded information has been reviewed and is accurate.  BP 122/75 mmHg  Pulse 68  Temp(Src) 97.9 F (36.6 C) (Oral)  Resp 16  SpO2 98%  Meds ordered this encounter  Medications  . naproxen (NAPROSYN) 500 MG tablet    Sig: Take 1 tablet (500 mg total) by mouth 2 (two) times daily as needed for mild pain, moderate pain or headache (TAKE WITH MEALS.).    Dispense:  20 tablet    Refill:  0    Order Specific Question:  Supervising Provider    Answer:  MILLER, BRIAN [[id number]]  . doxycycline (VIBRAMYCIN) 100 MG capsule    Sig: Take 1 capsule (100 mg total) by mouth 2 (two) times daily. One po bid x 7 days    Dispense:  14 capsule    Refill:  0    Order Specific Question:  Supervising Provider    Answer:  Eber HongMILLER, BRIAN [[id number]]      Saxton Chain Camprubi-Soms,  PA-C 05/02/15 [phone]  Donnetta HutchingBrian Cook, MD 05/03/15 [phone]

## 2015-05-05 ENCOUNTER — Emergency Department (HOSPITAL_COMMUNITY)
Admission: EM | Admit: 2015-05-05 | Discharge: 2015-05-05 | Disposition: A | Discharge status: Home / Self Care | Payer: 59 | Attending: Emergency Medicine | Admitting: Emergency Medicine

## 2015-05-05 ENCOUNTER — Encounter (HOSPITAL_COMMUNITY): Payer: Self-pay | Admitting: Cardiology

## 2015-05-05 DIAGNOSIS — Z862 Personal history of diseases of the blood and blood-forming organs and certain disorders involving the immune mechanism: Secondary | ICD-10-CM | POA: Insufficient documentation

## 2015-05-05 DIAGNOSIS — K0889 Other specified disorders of teeth and supporting structures: Secondary | ICD-10-CM | POA: Insufficient documentation

## 2015-05-05 DIAGNOSIS — Z7952 Long term (current) use of systemic steroids: Secondary | ICD-10-CM | POA: Insufficient documentation

## 2015-05-05 DIAGNOSIS — Z8619 Personal history of other infectious and parasitic diseases: Secondary | ICD-10-CM | POA: Insufficient documentation

## 2015-05-05 DIAGNOSIS — R51 Headache: Secondary | ICD-10-CM | POA: Insufficient documentation

## 2015-05-05 DIAGNOSIS — H9319 Tinnitus, unspecified ear: Secondary | ICD-10-CM | POA: Insufficient documentation

## 2015-05-05 DIAGNOSIS — Z872 Personal history of diseases of the skin and subcutaneous tissue: Secondary | ICD-10-CM | POA: Insufficient documentation

## 2015-05-05 DIAGNOSIS — Z79899 Other long term (current) drug therapy: Secondary | ICD-10-CM | POA: Insufficient documentation

## 2015-05-05 DIAGNOSIS — Z792 Long term (current) use of antibiotics: Secondary | ICD-10-CM | POA: Insufficient documentation

## 2015-05-05 MED ORDER — OXYCODONE-ACETAMINOPHEN 5-325 MG PO TABS
2.0000 | ORAL_TABLET | Freq: Once | ORAL | Status: AC
  Administered 2015-05-05: 2 via ORAL
  Filled 2015-05-05: qty 2

## 2015-05-05 MED ORDER — OXYCODONE-ACETAMINOPHEN 5-325 MG PO TABS: 2.0000 | ORAL_TABLET | ORAL | Status: DC | PRN

## 2015-05-05 MED ORDER — PENICILLIN V POTASSIUM 250 MG PO TABS
500.0000 mg | ORAL_TABLET | Freq: Once | ORAL | Status: AC
  Administered 2015-05-05: 500 mg via ORAL
  Filled 2015-05-05: qty 2

## 2015-05-05 MED ORDER — PENICILLIN V POTASSIUM 500 MG PO TABS: 500.0000 mg | ORAL_TABLET | Freq: Four times a day (QID) | ORAL | Status: DC

## 2015-05-05 NOTE — Discharge Instructions (Signed)
Take Veetid as directed until gone. Take Percocet as needed for pain. Refer to attached documents for more information.

## 2015-05-05 NOTE — ED Provider Notes (Signed)
CSN: [id number]     Arrival date & time 05/05/15  [id number] History  By signing my name below, I, Essence Howell, attest that this documentation has been prepared under the direction and in the presence of Emilia BeckKaitlyn Haleema Vanderheyden, PA-C Electronically Signed: Charline BillsEssence Howell, ED Scribe 05/05/2015 at 11:02 AM.   Chief Complaint  Patient presents with  . Dental Pain   The history is provided by the patient. No language interpreter was used.   HPI Comments: [name] is a 29 y.o. female who presents to the Emergency Department complaining of gradually worsening, constant right upper dental pain for the past 6 days. Pt was seen in the ED on 05/02/15 with throbbing dental pain onset 3 days prior. She was discharged with Naproxen and doxycycline which she states has provided no relief. Today, she presents with worsening pain is exacerbated with talking. She also reports associated right-sided HA and tinnitus. She denies fever. She has contacted numerous dentists but states that she can not be seen any time soon or can not afford the copay.   Past Medical History  Diagnosis Date  . Anemia   . Acne   . Chlamydia    Past Surgical History  Procedure Laterality Date  . Cesarean section    . Dilation and curettage of uterus     Family History  Problem Relation Age of Onset  . Adopted: Yes  . Other Neg Hx    Social History  Substance Use Topics  . Smoking status: Never Smoker   . Smokeless tobacco: Never Used  . Alcohol Use: No   OB History    Gravida Para Term Preterm AB TAB SAB Ectopic Multiple Living   4 3 3  0 1 1 0 0 0 3     Review of Systems  Constitutional: Negative for fever.  HENT: Positive for dental problem and tinnitus.   Neurological: Positive for headaches.  All other systems reviewed and are negative.  Allergies  Review of patient's allergies indicates no known allergies.  Home Medications   Prior to Admission medications   Medication Sig Start Date End Date Taking?  Authorizing Provider  amoxicillin (AMOXIL) 500 MG capsule Take 1 capsule (500 mg total) by mouth 3 (three) times daily. 11/19/14   Emre Stock, PA-C  cyclobenzaprine (FLEXERIL) 10 MG tablet Take 1 tablet (10 mg total) by mouth 3 (three) times daily as needed for muscle spasms. Patient not taking: Reported on 07/10/2014 07/15/13   Trixie DredgeEmily West, PA-C  dextromethorphan (DELSYM) 30 MG/5ML liquid Take 5 mLs (30 mg total) by mouth as needed for cough. 11/19/14   Rian Busche, PA-C  doxycycline (VIBRAMYCIN) 100 MG capsule Take 1 capsule (100 mg total) by mouth 2 (two) times daily. One po bid x 7 days 05/02/15   Mercedes Camprubi-Soms, PA-C  etonogestrel-ethinyl estradiol (NUVARING) 0.12-0.015 MG/24HR vaginal ring Insert vaginally and leave in place for 3 consecutive weeks, then remove for 1 week. 09/24/13   Brock Badharles A Harper, MD  HYDROcodone-acetaminophen (NORCO/VICODIN) 5-325 MG per tablet Take 1 tablet by mouth every 4 (four) hours as needed for moderate pain or severe pain. 07/10/14   Trixie DredgeEmily West, PA-C  ibuprofen (ADVIL,MOTRIN) 800 MG tablet Take 1 tablet (800 mg total) by mouth every 8 (eight) hours as needed for mild pain or moderate pain. 07/10/14   Trixie DredgeEmily West, PA-C  Multiple Vitamin (MULTIVITAMIN WITH MINERALS) TABS tablet Take 1 tablet by mouth daily.    Historical Provider, MD  naproxen (NAPROSYN) 500 MG tablet Take 1  tablet (500 mg total) by mouth 2 (two) times daily as needed for mild pain, moderate pain or headache (TAKE WITH MEALS.). 05/02/15   Mercedes Camprubi-Soms, PA-C  predniSONE (DELTASONE) 20 MG tablet Take 2 tablets (40 mg total) by mouth daily. 11/28/14   Abigail Harris, PA-C   BP 134/88 mmHg  Pulse 83  Temp(Src) 98.6 F (37 C) (Oral)  Resp 17  Ht  (1.651 m)  Wt 215 lb (97.523 kg)  BMI 35.78 kg/m2  SpO2 100%  LMP 04/21/2015 Physical Exam  Constitutional: She is oriented to person, place, and time. She appears well-developed and well-nourished. No distress.  HENT:  Head:  Normocephalic and atraumatic.  Mouth/Throat: No trismus in the jaw.  R upper 2nd molar tender to percussion. No abscess or gingival swelling noted.  Eyes: Conjunctivae and EOM are normal.  Neck: Neck supple. No tracheal deviation present.  Cardiovascular: Normal rate.   Pulmonary/Chest: Effort normal. No respiratory distress.  Abdominal: Soft. She exhibits no distension. There is no tenderness. There is no rebound.  Musculoskeletal: Normal range of motion.  Neurological: She is alert and oriented to person, place, and time.  Skin: Skin is warm and dry.  Psychiatric: She has a normal mood and affect. Her behavior is normal.  Nursing note and vitals reviewed.  ED Course  Procedures (including critical care time) DIAGNOSTIC STUDIES: Oxygen Saturation is 100% on RA, normal by my interpretation.    COORDINATION OF CARE: 10:59 AM-Discussed treatment plan which includes Veetid and Percocet with pt at bedside and pt agreed to plan.   Labs Review Labs Reviewed - No data to display  Imaging Review No results found. I have personally reviewed and evaluated these images and lab results as part of my medical decision-making.   EKG Interpretation None      MDM   Final diagnoses:  Pain, dental    No signs of ludwigs angina. Patient will have veetid and vicodin and instructions to follow up with recommended dentist.   I personally performed the services described in this documentation, which was scribed in my presence. The recorded information has been reviewed and is accurate.    Emilia Beck, PA-C 05/07/15 0354  Tilden Fossa, MD 05/07/15 [phone]

## 2015-05-05 NOTE — ED Notes (Signed)
Declined W/C at D/C and was escorted to lobby by RN. 

## 2015-05-05 NOTE — ED Notes (Signed)
Reports dental pain to the right upper side of her mouth. States she was seen here Friday and given pain medication and antibiotics but is not feeling better.

## 2015-12-01 ENCOUNTER — Emergency Department (HOSPITAL_COMMUNITY)
Admission: EM | Admit: 2015-12-01 | Discharge: 2015-12-01 | Disposition: A | Discharge status: Home / Self Care | Payer: No Typology Code available for payment source | Attending: Emergency Medicine | Admitting: Emergency Medicine

## 2015-12-01 ENCOUNTER — Encounter (HOSPITAL_COMMUNITY): Payer: Self-pay | Admitting: Emergency Medicine

## 2015-12-01 DIAGNOSIS — N939 Abnormal uterine and vaginal bleeding, unspecified: Secondary | ICD-10-CM | POA: Insufficient documentation

## 2015-12-01 LAB — COMPREHENSIVE METABOLIC PANEL
ALT: 13 U/L — AB (ref 14–54)
AST: 16 U/L (ref 15–41)
Albumin: 3.6 g/dL (ref 3.5–5.0)
Alkaline Phosphatase: 90 U/L (ref 38–126)
Anion gap: 5 (ref 5–15)
CO2: 24 mmol/L (ref 22–32)
CREATININE: 0.76 mg/dL (ref 0.44–1.00)
Calcium: 8.9 mg/dL (ref 8.9–10.3)
Chloride: 105 mmol/L (ref 101–111)
GFR calc Af Amer: >60 mL/min (ref >60)
GFR calc non Af Amer: >60 mL/min (ref >60)
Glucose, Bld: 91 mg/dL (ref 65–99)
Potassium: 3.4 mmol/L — ABNORMAL LOW (ref 3.5–5.1)
SODIUM: 134 mmol/L — AB (ref 135–145)
Total Bilirubin: 0.2 mg/dL — ABNORMAL LOW (ref 0.3–1.2)
Total Protein: 7.7 g/dL (ref 6.5–8.1)

## 2015-12-01 LAB — CBC
HCT: 28.4 % — ABNORMAL LOW (ref 36.0–46.0)
Hemoglobin: 8.7 g/dL — ABNORMAL LOW (ref 12.0–15.0)
MCH: 24.2 pg — AB (ref 26.0–34.0)
MCHC: 30.6 g/dL (ref 30.0–36.0)
MCV: 79.1 fL (ref 78.0–100.0)
PLATELETS: 349 10*3/uL (ref 150–400)
RBC: 3.59 MIL/uL — ABNORMAL LOW (ref 3.87–5.11)
RDW: 15.6 % — AB (ref 11.5–15.5)
WBC: 4.7 10*3/uL (ref 4.0–10.5)

## 2015-12-01 LAB — LIPASE, BLOOD: Lipase: 28 U/L (ref 11–51)

## 2015-12-01 NOTE — ED Notes (Signed)
patient came to desk stating "I'll just come back tomorrow to be seen"; Thayer Ohmhris, RN made aware

## 2015-12-01 NOTE — ED Notes (Signed)
Pt reports heavy vaginal bleeding x 3 weeks. Pt also reporting lower abd pain. Pt alert x4.

## 2015-12-06 ENCOUNTER — Encounter (HOSPITAL_COMMUNITY): Payer: Self-pay | Admitting: *Deleted

## 2015-12-06 ENCOUNTER — Inpatient Hospital Stay (HOSPITAL_COMMUNITY)
Admission: AD | Admit: 2015-12-06 | Discharge: 2015-12-06 | Disposition: A | Discharge status: Home / Self Care | Payer: No Typology Code available for payment source | Source: Ambulatory Visit | Attending: Family Medicine | Admitting: Family Medicine

## 2015-12-06 DIAGNOSIS — R1030 Lower abdominal pain, unspecified: Secondary | ICD-10-CM | POA: Insufficient documentation

## 2015-12-06 DIAGNOSIS — N939 Abnormal uterine and vaginal bleeding, unspecified: Secondary | ICD-10-CM

## 2015-12-06 DIAGNOSIS — D62 Acute posthemorrhagic anemia: Secondary | ICD-10-CM

## 2015-12-06 DIAGNOSIS — Z3202 Encounter for pregnancy test, result negative: Secondary | ICD-10-CM | POA: Insufficient documentation

## 2015-12-06 DIAGNOSIS — N938 Other specified abnormal uterine and vaginal bleeding: Secondary | ICD-10-CM | POA: Insufficient documentation

## 2015-12-06 LAB — CBC
HCT: 25.3 % — ABNORMAL LOW (ref 36.0–46.0)
Hemoglobin: 8 g/dL — ABNORMAL LOW (ref 12.0–15.0)
MCH: 24.9 pg — ABNORMAL LOW (ref 26.0–34.0)
MCHC: 31.6 g/dL (ref 30.0–36.0)
MCV: 78.8 fL (ref 78.0–100.0)
Platelets: 361 10*3/uL (ref 150–400)
RBC: 3.21 MIL/uL — ABNORMAL LOW (ref 3.87–5.11)
RDW: 15.8 % — AB (ref 11.5–15.5)
WBC: 8.5 10*3/uL (ref 4.0–10.5)

## 2015-12-06 LAB — URINE MICROSCOPIC-ADD ON: SQUAMOUS EPITHELIAL / LPF: NONE SEEN

## 2015-12-06 LAB — POCT PREGNANCY, URINE: PREG TEST UR: NEGATIVE

## 2015-12-06 LAB — URINALYSIS, ROUTINE W REFLEX MICROSCOPIC
BILIRUBIN URINE: NEGATIVE
GLUCOSE, UA: NEGATIVE mg/dL
KETONES UR: NEGATIVE mg/dL
Leukocytes, UA: NEGATIVE
Nitrite: NEGATIVE
PH: 5.5 (ref 5.0–8.0)
PROTEIN: NEGATIVE mg/dL
Specific Gravity, Urine: 1.02 (ref 1.005–1.030)

## 2015-12-06 MED ORDER — MEDROXYPROGESTERONE ACETATE 10 MG PO TABS
10.0000 mg | ORAL_TABLET | Freq: Every day | ORAL | Status: DC
Start: 2015-12-06 — End: 2018-04-27

## 2015-12-06 MED ORDER — FERROUS SULFATE 325 (65 FE) MG PO TABS: 325.0000 mg | ORAL_TABLET | Freq: Two times a day (BID) | ORAL | Status: DC

## 2015-12-06 MED ORDER — MEDROXYPROGESTERONE ACETATE 10 MG PO TABS
20.0000 mg | ORAL_TABLET | Freq: Once | ORAL | Status: AC
  Administered 2015-12-06: 20 mg via ORAL
  Filled 2015-12-06: qty 2

## 2015-12-06 MED ORDER — SODIUM CHLORIDE 0.9 % IV SOLN
510.0000 mg | Freq: Once | INTRAVENOUS | Status: AC
  Administered 2015-12-06: 510 mg via INTRAVENOUS
  Filled 2015-12-06: qty 17

## 2015-12-06 MED ORDER — LACTATED RINGERS IV BOLUS (SEPSIS)
1000.0000 mL | Freq: Once | INTRAVENOUS | Status: AC
  Administered 2015-12-06: 1000 mL via INTRAVENOUS

## 2015-12-06 NOTE — MAU Provider Note (Signed)
History     CSN: [id number]  Arrival date and time: 12/06/15 1430   First Provider Initiated Contact with Patient 12/06/15 1538      No chief complaint on file.  HPI  OB History    Gravida Para Term Preterm AB TAB SAB Ectopic Multiple Living   4 3 3  0 1 1 0 0 0 3      [name] is a 30 y.o. non pregnant female [phone] [id number] presenting to MAU with abnormal vaginal bleeding. This is the first episode of abnormal vaginal bleeding that she has experienced.   Patient is a patient of Dr. Clearance CootsHarper, however has not been to his office in a while.   LMP: April 7th She started her normal cycle on May 5th and has been bleeding ever since the 5th of May. She does not have a history of irregular periods. She was on the nuva ring for years and recently stopped using it because she desires pregnancy.   She has been experiencing off and on dizziness. None currently.    + Lower abdominal cramping. That comes and goes. Minimal pain currently.  History of anemia; not taking iron like prescribed.   Past Medical History  Diagnosis Date  . Anemia   . Acne   . Chlamydia     Past Surgical History  Procedure Laterality Date  . Cesarean section    . Dilation and curettage of uterus      Family History  Problem Relation Age of Onset  . Adopted: Yes  . Other Neg Hx     Social History  Substance Use Topics  . Smoking status: Never Smoker   . Smokeless tobacco: Never Used  . Alcohol Use: No    Allergies: No Known Allergies  Prescriptions prior to admission  Medication Sig Dispense Refill Last Dose  . ibuprofen (ADVIL,MOTRIN) 200 MG tablet Take 200 mg by mouth every 6 (six) hours as needed.   Past Month at Unknown time  . Multiple Vitamin (MULTIVITAMIN WITH MINERALS) TABS tablet Take 1 tablet by mouth daily.   Past Week at Unknown time  . etonogestrel-ethinyl estradiol (NUVARING) 0.12-0.015 MG/24HR vaginal ring Insert vaginally and leave in place for 3 consecutive weeks, then remove  for 1 week. (Patient not taking: Reported on 12/06/2015) 1 each 11 Not Taking at Unknown time   Results for orders placed or performed during the hospital encounter of 12/06/15 (from the past 48 hour(s))  Urinalysis, Routine w reflex microscopic (not [facility])     Status: Abnormal   Collection Time: 12/06/15  3:15 PM  Result Value Ref Range   Color, Urine YELLOW YELLOW   APPearance CLEAR CLEAR   Specific Gravity, Urine 1.020 1.005 - 1.030   pH 5.5 5.0 - 8.0   Glucose, UA NEGATIVE NEGATIVE mg/dL   Hgb urine dipstick MODERATE (A) NEGATIVE   Bilirubin Urine NEGATIVE NEGATIVE   Ketones, ur NEGATIVE NEGATIVE mg/dL   Protein, ur NEGATIVE NEGATIVE mg/dL   Nitrite NEGATIVE NEGATIVE   Leukocytes, UA NEGATIVE NEGATIVE  Urine microscopic-add on     Status: Abnormal   Collection Time: 12/06/15  3:15 PM  Result Value Ref Range   Squamous Epithelial / LPF NONE SEEN NONE SEEN   WBC, UA 0-5 0 - 5 WBC/hpf   RBC / HPF 0-5 0 - 5 RBC/hpf   Bacteria, UA FEW (A) NONE SEEN    Review of Systems  Constitutional: Positive for malaise/fatigue.  Neurological: Positive for weakness. Negative for dizziness (currently,  dizziness reported with standing. ).   Physical Exam   Blood pressure 131/75, pulse 89, temperature 99 F (37.2 C), temperature source Oral, resp. rate 18, height  (1.651 m), weight 233 lb 12.8 oz (106.051 kg), last menstrual period 11/14/2015.  Physical Exam  Constitutional: She is oriented to person, place, and time. She appears well-developed and well-nourished. No distress.  HENT:  Head: Normocephalic.  Eyes: Pupils are equal, round, and reactive to light.  Respiratory: Effort normal.  GI: Soft.  Genitourinary:  Speculum exam: Vagina - Small- moderate amount of dark red blood pooling in the vagina.  Cervix - + small, active blood oozing from the vagina.  Bimanual exam: Cervix closed, nabothian cyst at 9 o'clock  Uterus non tender, normal size Adnexa non tender, no masses  bilaterally Chaperone present for exam.  Musculoskeletal: Normal range of motion.  Neurological: She is alert and oriented to person, place, and time.  Skin: Skin is warm. She is not diaphoretic.  Psychiatric: Her behavior is normal.    MAU Course  Procedures  None  MDM  Urine pregnancy test negative.  System down: Hgb: 8.0  HCT: 25.3 Feraheme 510 IV Lr bolus Provera 20 mg PO   Orthostatic vitals following Feraheme and Lr bolus.  Normal vitals Patient up without dizziness and feeling much better.    Assessment and Plan   A:  1. Abnormal uterine bleeding   2. Acute blood loss anemia     P:  Discharge home in stable condition RX: provera, iron Bleeding precautions Patient given [facility] contact info if she desires to, or she can follow up with Dr Clearance Coots Return to MAU if symptoms worsen    Nicole Lope, NP 12/06/2015 5:51 PM

## 2015-12-06 NOTE — MAU Note (Signed)
Bleeding profusely for 3.5 weeks, having to wear pad and tampon at the same time,.  Started to have dizziness, SOB.  Noticed quarter sized clots

## 2015-12-06 NOTE — Discharge Instructions (Signed)
Abnormal Uterine Bleeding °Abnormal uterine bleeding can affect women at various stages in life, including teenagers, women in their reproductive years, pregnant women, and women who have reached menopause. Several kinds of uterine bleeding are considered abnormal, including: °· Bleeding or spotting between periods.   °· Bleeding after sexual intercourse.   °· Bleeding that is heavier or more than normal.   °· Periods that last longer than usual. °· Bleeding after menopause.   °Many cases of abnormal uterine bleeding are minor and simple to treat, while others are more serious. Any type of abnormal bleeding should be evaluated by your health care provider. Treatment will depend on the cause of the bleeding. °HOME CARE INSTRUCTIONS °Monitor your condition for any changes. The following actions may help to alleviate any discomfort you are experiencing: °· Avoid the use of tampons and douches as directed by your health care provider. °· Change your pads frequently. °You should get regular pelvic exams and Pap tests. Keep all follow-up appointments for diagnostic tests as directed by your health care provider.  °SEEK MEDICAL CARE IF:  °· Your bleeding lasts more than 1 week.   °· You feel dizzy at times.   °SEEK IMMEDIATE MEDICAL CARE IF:  °· You pass out.   °· You are changing pads every 15 to 30 minutes.   °· You have abdominal pain. °· You have a fever.   °· You become sweaty or weak.   °· You are passing large blood clots from the vagina.   °· You start to feel nauseous and vomit. °MAKE SURE YOU:  °· Understand these instructions. °· Will watch your condition. °· Will get help right away if you are not doing well or get worse. °  °This information is not intended to replace advice given to you by your health care provider. Make sure you discuss any questions you have with your health care provider. °  °Document Released: 06/28/2005 Document Revised: 07/03/2013 Document Reviewed: 01/25/2013 °Elsevier Interactive  Patient Education ©2016 [employer] °Anemia, Nonspecific °Anemia is a condition in which the concentration of red blood cells or hemoglobin in the blood is below normal. Hemoglobin is a substance in red blood cells that carries oxygen to the tissues of the body. Anemia results in not enough oxygen reaching these tissues.  °CAUSES  °Common causes of anemia include:  °· Excessive bleeding. Bleeding may be internal or external. This includes excessive bleeding from periods (in women) or from the intestine.   °· Poor nutrition.   °· Chronic kidney, thyroid, and liver disease.  °· Bone marrow disorders that decrease red blood cell production. °· Cancer and treatments for cancer. °· HIV, AIDS, and their treatments. °· Spleen problems that increase red blood cell destruction. °· Blood disorders. °· Excess destruction of red blood cells due to infection, medicines, and autoimmune disorders. °SIGNS AND SYMPTOMS  °· Minor weakness.   °· Dizziness.   °· Headache. °· Palpitations.   °· Shortness of breath, especially with exercise.   °· Paleness. °· Cold sensitivity. °· Indigestion. °· Nausea. °· Difficulty sleeping. °· Difficulty concentrating. °Symptoms may occur suddenly or they may develop slowly.  °DIAGNOSIS  °Additional blood tests are often needed. These help your health care provider determine the best treatment. Your health care provider will check your stool for blood and look for other causes of blood loss.  °TREATMENT  °Treatment varies depending on the cause of the anemia. Treatment can include:  °· Supplements of iron, vitamin B12, or folic acid.   °· Hormone medicines.   °· A blood transfusion. This may be needed if blood loss is   severe.   °· Hospitalization. This may be needed if there is significant continual blood loss.   °· Dietary changes. °· Spleen removal. °HOME CARE INSTRUCTIONS °Keep all follow-up appointments. It often takes many weeks to correct anemia, and having your health care provider check on  your condition and your response to treatment is very important. °SEEK IMMEDIATE MEDICAL CARE IF:  °· You develop extreme weakness, shortness of breath, or chest pain.   °· You become dizzy or have trouble concentrating. °· You develop heavy vaginal bleeding.   °· You develop a rash.   °· You have bloody or black, tarry stools.   °· You faint.   °· You vomit up blood.   °· You vomit repeatedly.   °· You have abdominal pain. °· You have a fever or persistent symptoms for more than 2-3 days.   °· You have a fever and your symptoms suddenly get worse.   °· You are dehydrated.   °MAKE SURE YOU: °· Understand these instructions. °· Will watch your condition. °· Will get help right away if you are not doing well or get worse. °  °This information is not intended to replace advice given to you by your health care provider. Make sure you discuss any questions you have with your health care provider. °  °Document Released: 08/05/2004 Document Revised: 02/28/2013 Document Reviewed: 12/22/2012 °Elsevier Interactive Patient Education ©2016 [employer] ° °

## 2016-10-10 ENCOUNTER — Encounter (HOSPITAL_COMMUNITY): Payer: Self-pay | Admitting: Emergency Medicine

## 2016-10-10 ENCOUNTER — Emergency Department (HOSPITAL_COMMUNITY)
Admission: EM | Admit: 2016-10-10 | Discharge: 2016-10-11 | Disposition: A | Discharge status: Home / Self Care | Payer: Medicaid Other | Attending: Emergency Medicine | Admitting: Emergency Medicine

## 2016-10-10 DIAGNOSIS — R21 Rash and other nonspecific skin eruption: Secondary | ICD-10-CM | POA: Diagnosis present

## 2016-10-10 DIAGNOSIS — L299 Pruritus, unspecified: Secondary | ICD-10-CM | POA: Insufficient documentation

## 2016-10-10 MED ORDER — KETOCONAZOLE 2 % EX SHAM: 1.0000 "application " | MEDICATED_SHAMPOO | Freq: Every day | CUTANEOUS | 0 refills | Status: AC

## 2016-10-10 MED ORDER — HYDROXYZINE HCL 25 MG PO TABS: 25.0000 mg | ORAL_TABLET | Freq: Four times a day (QID) | ORAL | 0 refills | Status: DC

## 2016-10-10 MED ORDER — KETOCONAZOLE-HYDROCORTISONE 2 & 1 % EX KIT: 1.0000 "application " | PACK | Freq: Two times a day (BID) | CUTANEOUS | 0 refills | Status: DC

## 2016-10-10 NOTE — Discharge Instructions (Signed)
We suspect that your rash is ringworm, a rash from a fungal overgrowth. Please apply shampoo and cream as indicated. Take hydroxyzine for generalized itching, you may take Benadryl before bedtime if you're having a hard time controlling her itching.  Continue applying shampoo for a minimum of 15-20 days.  Your itching and redness will start to ease and eventually the rash will disappear.  Call your primary care provider and make an appointment for 1-2 weeks for re-evaluation of your rash.  Return to the emergency department if your rash worsens.

## 2016-10-10 NOTE — ED Provider Notes (Signed)
[facility] DEPT Provider Note   CSN: [id number] Arrival date & time: 10/10/16  2122  By signing my name below, I, Dora Sims, attest that this documentation has been prepared under the direction and in the presence of Carmon Sails, PA-C. Electronically Signed: Dora Sims, Scribe. 10/10/2016. 10:54 PM.  History   Chief Complaint Chief Complaint  Patient presents with  . Rash    The history is provided by the patient. No language interpreter was used.     HPI Comments: [name] is a 31 y.o. female who presents to the Emergency Department complaining of a constant, red, bumpy and pruritic rash for about one week. She states the rash began on her left shoulder but is now on her right shoulder, below right breast and lower back. Pt reports she has been experiencing a pruritic sensation all over her body but notes her rash is only present on the aforementioned areas. She has taken Benadryl and applied hydrocortisone cream with transient relief of the pruritis. Pt denies recent use of new lotions, creams, soaps, detergents, or other cosmetic/household products. No new medications. No recent swimming or unusual plant/animal exposure. She denies fevers, nausea, vomiting, pain, throat swelling, SOB, or any other associated symptoms. Patient notes she once had bed bugs and is afraid she has bed bugs again.  No rash in between fingers, toes, genitals or intraorally.   Past Medical History:  Diagnosis Date  . Acne   . Anemia   . Chlamydia     Patient Active Problem List   Diagnosis Date Noted  . ASTHMA 04/12/2007    Past Surgical History:  Procedure Laterality Date  . CESAREAN SECTION    . DILATION AND CURETTAGE OF UTERUS      OB History    Gravida Para Term Preterm AB Living   _0 0 1 3   SAB TAB Ectopic Multiple Live Births   0 1 0 0 3       Home Medications    Prior to Admission medications   Medication Sig Start Date End Date Taking? Authorizing Provider   ferrous sulfate 325 (65 FE) MG tablet Take 1 tablet (325 mg total) by mouth 2 (two) times daily with a meal. 12/06/15   Lezlie Lye, NP  hydrOXYzine (ATARAX/VISTARIL) 25 MG tablet Take 1 tablet (25 mg total) by mouth every 6 (six) hours. 10/10/16   Kinnie Feil, PA-C  ibuprofen (ADVIL,MOTRIN) 200 MG tablet Take 200 mg by mouth every 6 (six) hours as needed.    Historical Provider, MD  ketoconazole (NIZORAL) 2 % shampoo Apply 1 application topically daily. Apply shampoo to affected areas, wait 20-30 minutes and then rinse.   Continue for at least 15-20 days 10/10/16 11/09/16  Kinnie Feil, PA-C  Ketoconazole-Hydrocortisone 2 & 1 % KIT Apply 1 application topically 2 (two) times daily. 10/10/16   Kinnie Feil, PA-C  medroxyPROGESTERone (PROVERA) 10 MG tablet Take 1 tablet (10 mg total) by mouth daily. 12/06/15   Lezlie Lye, NP  Multiple Vitamin (MULTIVITAMIN WITH MINERALS) TABS tablet Take 1 tablet by mouth daily.    Historical Provider, MD    Family History Family History  Problem Relation Age of Onset  . Adopted: Yes  . Other Neg Hx     Social History Social History  Substance Use Topics  . Smoking status: Never Smoker  . Smokeless tobacco: Never Used  . Alcohol use No     Allergies   Patient has no  known allergies.   Review of Systems Review of Systems  Constitutional: Negative for fever.  Respiratory: Negative for shortness of breath.   Gastrointestinal: Negative for nausea and vomiting.  Musculoskeletal: Negative for arthralgias and myalgias.  Skin: Positive for rash.   Physical Exam Updated Vital Signs BP 125/84 (BP Location: Right Arm)   Pulse 82   Temp 98.5 F (36.9 C) (Oral)   Resp 18   Ht 5' 4" (1.626 m)   Wt 99.8 kg   LMP 08/13/2016   SpO2 99%   BMI 37.76 kg/m   Physical Exam  Constitutional: She is oriented to person, place, and time. She appears well-developed and well-nourished. She is cooperative. No distress.  HENT:  Head:  Normocephalic and atraumatic.  Nose: Nose normal.  Eyes: Conjunctivae and EOM are normal. No scleral icterus.  Cardiovascular: Normal rate, regular rhythm, normal heart sounds and intact distal pulses.   No murmur heard. Pulmonary/Chest: Effort normal and breath sounds normal. She has no wheezes.  Musculoskeletal: Normal range of motion. She exhibits no deformity.  Neurological: She is alert and oriented to person, place, and time.  Skin: Skin is warm and dry. Capillary refill takes less than 2 seconds. Rash noted. There is erythema.  Pruritic, erythematous, annular patch with central clearing and raised borders with pustules to the bilateral shoulders and midline lumbar spine. Some patches are coalescing together.  No associated tenderness or discharge.  No surrounding erythema or red streaking. See picture. No rash in between fingers, palms of hands or soles of feet.  Psychiatric: She has a normal mood and affect. Her behavior is normal. Judgment and thought content normal.  Nursing note and vitals reviewed.        ED Treatments / Results  Labs (all labs ordered are listed, but only abnormal results are displayed) Labs Reviewed - No data to display  EKG  EKG Interpretation None       Radiology No results found.  Procedures Procedures (including critical care time)  DIAGNOSTIC STUDIES: Oxygen Saturation is 100% on RA, normal by my interpretation.    COORDINATION OF CARE: 11:04 PM Discussed treatment plan with pt at bedside and pt agreed to plan.  Medications Ordered in ED Medications - No data to display   Initial Impression / Assessment and Plan / ED Course  I have reviewed the triage vital signs and the nursing notes.  Pertinent labs & imaging results that were available during my care of the patient were reviewed by me and considered in my medical decision making (see chart for details).     Patient with rash most resembling tinea corporis.  Patient notes  she is itchy all over and not just over the rash, however, which is not typical of tinea corporis.  Full skin exam revealed rash to bilateral shoulders, mid lumbar spine, and below right breast crease. No rash on anterior/posterior trunk or abdomen, palms of hands, soles of feet, LEs or in between fingers or toes.  Low suspicion for contact dermatitis.  Low suspicious for bed bugs. Will treat with anti-fungal medication. Pt instructed to keep the area dry. Contact precautions given. No signs of secondary infection. Follow up with PCP in 1-2 weeks or sooner if symptoms worsen. Return precautions discussed. Pt is safe for discharge at this time.  Patient, ED treatment and discharge plan was discussed with supervising physician who is agreeable with plan.   Final Clinical Impressions(s) / ED Diagnoses   Final diagnoses:  Rash  New Prescriptions Discharge Medication List as of 10/10/2016 11:50 PM    START taking these medications   Details  hydrOXYzine (ATARAX/VISTARIL) 25 MG tablet Take 1 tablet (25 mg total) by mouth every 6 (six) hours., Starting Sun 10/10/2016, Print    ketoconazole (NIZORAL) 2 % shampoo Apply 1 application topically daily. Apply shampoo to affected areas, wait 20-30 minutes and then rinse.   Continue for at least 15-20 days, Starting Sun 10/10/2016, Until Tue 11/09/2016, Print    Ketoconazole-Hydrocortisone 2 & 1 % KIT Apply 1 application topically 2 (two) times daily., Starting Sun 10/10/2016, Print       I personally performed the services described in this documentation, which was scribed in my presence. The recorded information has been reviewed and is accurate.    Claudia J Gibbons, PA-C 10/11/16 0221    Kathleen McManus, DO 10/12/16 2007  

## 2016-10-10 NOTE — ED Triage Notes (Signed)
Reports having a rash to bil shoulders middle of back and under breasts for the last week.  Taking benadryl and using hydrocortisone cream with no relief from itching.

## 2018-01-20 ENCOUNTER — Emergency Department (HOSPITAL_COMMUNITY)
Admission: EM | Admit: 2018-01-20 | Discharge: 2018-01-21 | Disposition: A | Discharge status: Home / Self Care | Payer: Self-pay | Attending: Emergency Medicine | Admitting: Emergency Medicine

## 2018-01-20 ENCOUNTER — Other Ambulatory Visit: Payer: Self-pay

## 2018-01-20 ENCOUNTER — Encounter (HOSPITAL_COMMUNITY): Payer: Self-pay | Admitting: Emergency Medicine

## 2018-01-20 DIAGNOSIS — K0889 Other specified disorders of teeth and supporting structures: Secondary | ICD-10-CM | POA: Insufficient documentation

## 2018-01-20 DIAGNOSIS — Z79899 Other long term (current) drug therapy: Secondary | ICD-10-CM | POA: Insufficient documentation

## 2018-01-20 DIAGNOSIS — J45909 Unspecified asthma, uncomplicated: Secondary | ICD-10-CM | POA: Insufficient documentation

## 2018-01-20 NOTE — ED Triage Notes (Signed)
Patient presents to ED for assessment of left lower dental pain for "a few months" but worsening x 1 week, with headaches, swelling, and general malaise.

## 2018-01-21 MED ORDER — IBUPROFEN 400 MG PO TABS
600.0000 mg | ORAL_TABLET | Freq: Once | ORAL | Status: AC
  Administered 2018-01-21: 600 mg via ORAL
  Filled 2018-01-21: qty 1

## 2018-01-21 MED ORDER — PENICILLIN V POTASSIUM 500 MG PO TABS: 500.0000 mg | ORAL_TABLET | Freq: Three times a day (TID) | ORAL | 0 refills | Status: DC

## 2018-01-21 MED ORDER — PENICILLIN V POTASSIUM 250 MG PO TABS
500.0000 mg | ORAL_TABLET | Freq: Once | ORAL | Status: AC
  Administered 2018-01-21: 500 mg via ORAL
  Filled 2018-01-21: qty 2

## 2018-01-21 MED ORDER — ACETAMINOPHEN 500 MG PO TABS
1000.0000 mg | ORAL_TABLET | Freq: Once | ORAL | Status: AC
  Administered 2018-01-21: 1000 mg via ORAL
  Filled 2018-01-21: qty 2

## 2018-01-21 NOTE — ED Provider Notes (Signed)
[facility] EMERGENCY DEPARTMENT Provider Note   CSN: [id number] Arrival date & time: 01/20/18  2103     History   Chief Complaint Chief Complaint  Patient presents with  . Dental Pain    HPI [name] is a 32 y.o. female.  HPI 33 year old female presents the emergency department complaints of left lower dental pain over the past 2 weeks.  She does not have a dentist.  She reports a throbbing type pain.  No fevers or chills.  No difficulty breathing or swallowing.  No facial swelling or neck pain.  Symptoms are moderate in severity.  She does report some headache and generalized malaise.   Past Medical History:  Diagnosis Date  . Acne   . Anemia   . Chlamydia     Patient Active Problem List   Diagnosis Date Noted  . ASTHMA 04/12/2007    Past Surgical History:  Procedure Laterality Date  . CESAREAN SECTION    . DILATION AND CURETTAGE OF UTERUS       OB History    Gravida  4   Para  3   Term  3   Preterm  0   AB  1   Living  3     SAB  0   TAB  1   Ectopic  0   Multiple  0   Live Births  3            Home Medications    Prior to Admission medications   Medication Sig Start Date End Date Taking? Authorizing Provider  ferrous sulfate 325 (65 FE) MG tablet Take 1 tablet (325 mg total) by mouth 2 (two) times daily with a meal. 12/06/15   Rasch, Anderson Malta I, NP  hydrOXYzine (ATARAX/VISTARIL) 25 MG tablet Take 1 tablet (25 mg total) by mouth every 6 (six) hours. 10/10/16   Kinnie Feil, PA-C  ibuprofen (ADVIL,MOTRIN) 200 MG tablet Take 200 mg by mouth every 6 (six) hours as needed.    [provider]  Ketoconazole-Hydrocortisone 2 & 1 % KIT Apply 1 application topically 2 (two) times daily. 10/10/16   Kinnie Feil, PA-C  medroxyPROGESTERone (PROVERA) 10 MG tablet Take 1 tablet (10 mg total) by mouth daily. 12/06/15   Rasch, Anderson Malta I, NP  Multiple Vitamin (MULTIVITAMIN WITH MINERALS) TABS tablet Take 1  tablet by mouth daily.    [provider]  penicillin v potassium (VEETID) 500 MG tablet Take 1 tablet (500 mg total) by mouth 3 (three) times daily. 01/21/18   Jola Schmidt, MD    Family History Family History  Adopted: Yes  Problem Relation Age of Onset  . Other Neg Hx     Social History Social History   Tobacco Use  . Smoking status: Never Smoker  . Smokeless tobacco: Never Used  Substance Use Topics  . Alcohol use: No  . Drug use: No     Allergies   Patient has no known allergies.   Review of Systems Review of Systems  All other systems reviewed and are negative.    Physical Exam Updated Vital Signs BP 138/88 (BP Location: Right Arm)   Pulse 73   Temp 98.9 F (37.2 C) (Oral)   Resp 18   SpO2 98%   Physical Exam  Constitutional: She is oriented to person, place, and time. She appears well-developed and well-nourished.  HENT:  Head: Normocephalic.  Dental tenderness of the left lower first molar.  No gingival swelling or  fluctuance.  Tolerating secretions.  Oral airway patent.  Anterior neck normal.  Space under her tongue is soft.  Eyes: EOM are normal.  Neck: Normal range of motion.  Pulmonary/Chest: Effort normal.  Abdominal: She exhibits no distension.  Musculoskeletal: Normal range of motion.  Neurological: She is alert and oriented to person, place, and time.  Psychiatric: She has a normal mood and affect.  Nursing note and vitals reviewed.    ED Treatments / Results  Labs (all labs ordered are listed, but only abnormal results are displayed) Labs Reviewed - No data to display  EKG None  Radiology No results found.  Procedures Procedures (including critical care time)  Medications Ordered in ED Medications  penicillin v potassium (VEETID) tablet 500 mg (has no administration in time range)  ibuprofen (ADVIL,MOTRIN) tablet 600 mg (has no administration in time range)  acetaminophen (TYLENOL) tablet 1,000 mg (has no  administration in time range)     Initial Impression / Assessment and Plan / ED Course  I have reviewed the triage vital signs and the nursing notes.  Pertinent labs & imaging results that were available during my care of the patient were reviewed by me and considered in my medical decision making (see chart for details).     Dental Pain. Home with antibiotics and pain medicine. Recommend dental follow up. No signs of gingival abscess. Tolerating secretions. Airway patent. No sub lingular swelling   Final Clinical Impressions(s) / ED Diagnoses   Final diagnoses:  Pain, dental    ED Discharge Orders        Ordered    penicillin v potassium (VEETID) 500 MG tablet  3 times daily     01/21/18 0105       Jola Schmidt, MD 01/21/18 0109

## 2018-04-10 ENCOUNTER — Encounter (HOSPITAL_COMMUNITY): Payer: Self-pay | Admitting: Emergency Medicine

## 2018-04-10 ENCOUNTER — Emergency Department (HOSPITAL_COMMUNITY)
Admission: EM | Admit: 2018-04-10 | Discharge: 2018-04-10 | Disposition: A | Discharge status: Home / Self Care | Payer: Medicaid Other | Attending: Emergency Medicine | Admitting: Emergency Medicine

## 2018-04-10 ENCOUNTER — Other Ambulatory Visit: Payer: Self-pay

## 2018-04-10 DIAGNOSIS — K0889 Other specified disorders of teeth and supporting structures: Secondary | ICD-10-CM | POA: Insufficient documentation

## 2018-04-10 DIAGNOSIS — J45909 Unspecified asthma, uncomplicated: Secondary | ICD-10-CM | POA: Insufficient documentation

## 2018-04-10 MED ORDER — IBUPROFEN 600 MG PO TABS: 600.0000 mg | ORAL_TABLET | Freq: Four times a day (QID) | ORAL | 0 refills | Status: DC | PRN

## 2018-04-10 MED ORDER — PENICILLIN V POTASSIUM 500 MG PO TABS: 500.0000 mg | ORAL_TABLET | Freq: Three times a day (TID) | ORAL | 0 refills | Status: DC

## 2018-04-10 NOTE — ED Triage Notes (Signed)
Pt with left lower dental pain. Reports she was seen here before and received referral but referral has expired.

## 2018-04-10 NOTE — ED Provider Notes (Signed)
[facility] EMERGENCY DEPARTMENT Provider Note   CSN: [id number] Arrival date & time: 04/10/18  4235     History   Chief Complaint Chief Complaint  Patient presents with  . Dental Pain    HPI [name] is a 32 y.o. female.  The history is provided by the patient and medical records. No language interpreter was used.  Dental Pain       32 year old female presenting complaining of dental pain.  Patient has had recurrent dental pain for the past few months.  She was seen in the ED on oh 7/12 for her pain and was giving refer to her dentist as well as antibiotic and pain medication.  She did not have the funds to follow-up with a dentist.  Her medication did help, however within the past few days her pain is returned.  Pain is primarily to her left lower tooth, throbbing achy worsening with chewing and with temperature changes.  No associated fever, trouble swallowing, chest pain or shortness of breath.  She did reach out to the dentist but was recommended to come to the ER to get a referral.  Past Medical History:  Diagnosis Date  . Acne   . Anemia   . Chlamydia     Patient Active Problem List   Diagnosis Date Noted  . ASTHMA 04/12/2007    Past Surgical History:  Procedure Laterality Date  . CESAREAN SECTION    . DILATION AND CURETTAGE OF UTERUS       OB History    Gravida  4   Para  3   Term  3   Preterm  0   AB  1   Living  3     SAB  0   TAB  1   Ectopic  0   Multiple  0   Live Births  3            Home Medications    Prior to Admission medications   Medication Sig Start Date End Date Taking? Authorizing Provider  ferrous sulfate 325 (65 FE) MG tablet Take 1 tablet (325 mg total) by mouth 2 (two) times daily with a meal. 12/06/15   Rasch, Anderson Malta I, NP  hydrOXYzine (ATARAX/VISTARIL) 25 MG tablet Take 1 tablet (25 mg total) by mouth every 6 (six) hours. 10/10/16   Kinnie Feil, PA-C  ibuprofen (ADVIL,MOTRIN)  200 MG tablet Take 200 mg by mouth every 6 (six) hours as needed.    [provider]  Ketoconazole-Hydrocortisone 2 & 1 % KIT Apply 1 application topically 2 (two) times daily. 10/10/16   Kinnie Feil, PA-C  medroxyPROGESTERone (PROVERA) 10 MG tablet Take 1 tablet (10 mg total) by mouth daily. 12/06/15   Rasch, Anderson Malta I, NP  Multiple Vitamin (MULTIVITAMIN WITH MINERALS) TABS tablet Take 1 tablet by mouth daily.    [provider]  penicillin v potassium (VEETID) 500 MG tablet Take 1 tablet (500 mg total) by mouth 3 (three) times daily. 01/21/18   Jola Schmidt, MD    Family History Family History  Adopted: Yes  Problem Relation Age of Onset  . Other Neg Hx     Social History Social History   Tobacco Use  . Smoking status: Never Smoker  . Smokeless tobacco: Never Used  Substance Use Topics  . Alcohol use: No  . Drug use: No     Allergies   Patient has no known allergies.   Review of Systems Review of  Systems  Constitutional: Negative for fever.  HENT: Positive for dental problem.   Neurological: Negative for headaches.     Physical Exam Updated Vital Signs BP (!) 155/89 (BP Location: Right Wrist)   Pulse 80   Temp 98.4 F (36.9 C) (Oral)   Resp 17   SpO2 100%   Physical Exam  Constitutional: She appears well-developed and well-nourished. No distress.  HENT:  Head: Atraumatic.  Mouth: Temporary dental filling were noted on tooth #18 with tenderness to palpation but no gingival erythema or abscess noted.  No trismus.  Eyes: Conjunctivae are normal.  Neck: Neck supple.  Lymphadenopathy:    She has no cervical adenopathy.  Neurological: She is alert.  Skin: No rash noted.  Psychiatric: She has a normal mood and affect.  Nursing note and vitals reviewed.    ED Treatments / Results  Labs (all labs ordered are listed, but only abnormal results are displayed) Labs Reviewed - No data to display  EKG None  Radiology No results  found.  Procedures Procedures (including critical care time)  Medications Ordered in ED Medications - No data to display   Initial Impression / Assessment and Plan / ED Course  I have reviewed the triage vital signs and the nursing notes.  Pertinent labs & imaging results that were available during my care of the patient were reviewed by me and considered in my medical decision making (see chart for details).     BP (!) 155/89 (BP Location: Right Wrist)   Pulse 80   Temp 98.4 F (36.9 C) (Oral)   Resp 17   SpO2 100%    Final Clinical Impressions(s) / ED Diagnoses   Final diagnoses:  Pain, dental    ED Discharge Orders         Ordered    ibuprofen (ADVIL,MOTRIN) 600 MG tablet  Every 6 hours PRN     04/10/18 1036    penicillin v potassium (VEETID) 500 MG tablet  3 times daily     04/10/18 1036         Patient with dentalgia.  No abscess requiring immediate incision and drainage.  Exam not concerning for Ludwig's angina or pharyngeal abscess.  Will treat with PCN, NSAIDs. Pt instructed to follow-up with dentist.  Discussed return precautions. Pt safe for discharge.    Domenic Moras, PA-C 04/10/18 [place], DO 04/10/18 1348

## 2018-04-27 ENCOUNTER — Emergency Department (HOSPITAL_COMMUNITY)
Admission: EM | Admit: 2018-04-27 | Discharge: 2018-04-27 | Disposition: A | Discharge status: Home / Self Care | Payer: Medicaid Other | Attending: Emergency Medicine | Admitting: Emergency Medicine

## 2018-04-27 ENCOUNTER — Encounter (HOSPITAL_COMMUNITY): Payer: Self-pay | Admitting: Emergency Medicine

## 2018-04-27 DIAGNOSIS — R220 Localized swelling, mass and lump, head: Secondary | ICD-10-CM | POA: Insufficient documentation

## 2018-04-27 DIAGNOSIS — J45909 Unspecified asthma, uncomplicated: Secondary | ICD-10-CM | POA: Insufficient documentation

## 2018-04-27 MED ORDER — DIPHENHYDRAMINE HCL 25 MG PO CAPS
25.0000 mg | ORAL_CAPSULE | Freq: Once | ORAL | Status: AC
  Administered 2018-04-27: 25 mg via ORAL
  Filled 2018-04-27: qty 1

## 2018-04-27 MED ORDER — PREDNISONE 20 MG PO TABS
60.0000 mg | ORAL_TABLET | Freq: Once | ORAL | Status: AC
  Administered 2018-04-27: 60 mg via ORAL
  Filled 2018-04-27: qty 3

## 2018-04-27 MED ORDER — FAMOTIDINE 20 MG PO TABS
20.0000 mg | ORAL_TABLET | Freq: Every day | ORAL | Status: DC
  Administered 2018-04-27: 20 mg via ORAL
  Filled 2018-04-27: qty 1

## 2018-04-27 MED ORDER — PREDNISONE 10 MG PO TABS: 20.0000 mg | ORAL_TABLET | Freq: Every day | ORAL | 0 refills | Status: AC

## 2018-04-27 NOTE — Discharge Instructions (Signed)
Thank you for allowing me to provide your care today in the emergency department.  You were given a dose of prednisone tonight in the emergency department.  Starting tomorrow, take 2 tablets of prednisone daily by mouth for 4 days.  If you continue to have itching or swelling to her lips, you can take Benadryl as directed on the label.  If your symptoms do not improve with this treatment in the next few days, you can follow-up with your primary care provider for reevaluation.  If you develop new or worsening symptoms including shortness of breath, feeling of your throat is closing, a rash all over your body, or an ulcer to the lip that is itchy and draining, you should return to the emergency department for reevaluation.

## 2018-04-27 NOTE — ED Triage Notes (Signed)
Patient reports sudden onset upper/lower lip itching with mild swelling onset this evening , respirations unlabored /airway intact , pt. suspects allergic reaction , no skin rashes .

## 2018-04-27 NOTE — ED Provider Notes (Signed)
[facility] EMERGENCY DEPARTMENT Provider Note   CSN: [id number] Arrival date & time: 04/27/18  0201     History   Chief Complaint Chief Complaint  Patient presents with  . Allergic Reaction    HPI [name] is a 32 y.o. female with a h/o genital herpes, chlamydia, asthma, and acne vulgaris who presents to the emergency department with a chief complaint of lip tingling and swelling.  The patient endorses constant tingling to the bilateral lips that began earlier this evening followed by pain and swelling.  Symptoms have mildly worsened since onset.  She reports that she then began to develop a tickle in her throat and feels as if there are small "bumps around the lips. She was concerned that she may be having an allergic reaction as she has previously been seen in the emergency department for similar in the last few years.  She treated her symptoms by applying Carmex lip gloss and taking ibuprofen without improvement.  She denies dyspnea, wheezing, urticaria, feeling as if her throat is closing, tongue swelling, or other complaints.  Recently completed a course of penicillin, last dose was approximately a week and a half ago.  She denies any new foods, soaps or lotions, or other detergents.  She reports that she has tried a new lip gloss recently.  She reports that she previously was diagnosed with genital herpes, but denies a history of oral herpes.   The history is provided by the patient. No language interpreter was used.    Past Medical History:  Diagnosis Date  . Acne   . Anemia   . Chlamydia     Patient Active Problem List   Diagnosis Date Noted  . ASTHMA 04/12/2007    Past Surgical History:  Procedure Laterality Date  . CESAREAN SECTION    . DILATION AND CURETTAGE OF UTERUS       OB History    Gravida  4   Para  3   Term  3   Preterm  0   AB  1   Living  3     SAB  0   TAB  1   Ectopic  0   Multiple  0   Live Births  3             Home Medications    Prior to Admission medications   Medication Sig Start Date End Date Taking? Authorizing Provider  predniSONE (DELTASONE) 10 MG tablet Take 2 tablets (20 mg total) by mouth daily for 4 days. 04/27/18 05/01/18  Brandye Inthavong, Coral Else, PA-C    Family History Family History  Adopted: Yes  Problem Relation Age of Onset  . Other Neg Hx     Social History Social History   Tobacco Use  . Smoking status: Never Smoker  . Smokeless tobacco: Never Used  Substance Use Topics  . Alcohol use: No  . Drug use: No     Allergies   Patient has no known allergies.   Review of Systems Review of Systems  Constitutional: Negative for activity change, chills and fever.  HENT: Positive for facial swelling and sore throat. Negative for congestion, ear pain, sinus pressure, sinus pain, trouble swallowing and voice change.   Respiratory: Negative for cough, shortness of breath and wheezing.   Cardiovascular: Negative for chest pain.  Gastrointestinal: Negative for abdominal pain, diarrhea and vomiting.  Genitourinary: Negative for dysuria.  Musculoskeletal: Negative for back pain.  Skin: Negative for rash.  Allergic/Immunologic:  Negative for immunocompromised state.  Neurological: Negative for headaches.  Psychiatric/Behavioral: Negative for confusion.     Physical Exam Updated Vital Signs BP 103/67   Pulse 65   Temp 99.5 F (37.5 C) (Oral)   Resp 16   SpO2 100%   Physical Exam  Constitutional: No distress.  No acute distress.  HENT:  Head: Normocephalic.  Inferior and superior labia are soft. No ulcerations. Fine maculopapular rash noted to the peroral skin. No rash visible to the bilateral lips. No sublingual induration or edema. No tongue swelling.    Eyes: Conjunctivae are normal.  Neck: Normal range of motion. Neck supple.  Cardiovascular: Normal rate, regular rhythm, normal heart sounds and intact distal pulses. Exam reveals no gallop and no  friction rub.  No murmur heard. Pulmonary/Chest: Effort normal. No stridor. No respiratory distress. She has no wheezes. She has no rales. She exhibits no tenderness.  Peaks in complete, fluent sentences.  Abdominal: Soft. She exhibits no distension.  Lymphadenopathy:    She has no cervical adenopathy.  Neurological: She is alert.  Skin: Skin is warm. No rash noted.  No urticaria.   Psychiatric: Her behavior is normal.  Nursing note and vitals reviewed.  ED Treatments / Results  Labs (all labs ordered are listed, but only abnormal results are displayed) Labs Reviewed - No data to display  EKG None  Radiology No results found.  Procedures Procedures (including critical care time)  Medications Ordered in ED Medications  famotidine (PEPCID) tablet 20 mg (20 mg Oral Given 04/27/18 0414)  predniSONE (DELTASONE) tablet 60 mg (60 mg Oral Given 04/27/18 0324)  diphenhydrAMINE (BENADRYL) capsule 25 mg (25 mg Oral Given 04/27/18 0324)     Initial Impression / Assessment and Plan / ED Course  I have reviewed the triage vital signs and the nursing notes.  Pertinent labs & imaging results that were available during my care of the patient were reviewed by me and considered in my medical decision making (see chart for details).     32 year old female with a h/o genital herpes, chlamydia, asthma, and acne vulgaris presenting with tingling and swelling of both lips, onset tonight.  Denies urticaria, throat closing, dyspnea, dentalgia, or URI symptoms.  On exam, lips do not appear edematous and there are no obvious lesions.  I have a low suspicion for drug allergy secondary to recent penicillin course given that she completed the course week and a half ago.  She has not had any other new exposures, but since she 10 states that symptoms are similar to previous allergic reaction will treat with prednisone, Benadryl, and Pepcid.  On re-evaluation, she report reports that her symptoms are  somewhat improved.  The patient was discussed with Dr. Terressa Koyanagi, attending physician.  She is well-appearing and in no acute distress.  Recommended if her symptoms do not improve with burst of prednisone to follow-up with her primary care provider.  Recommended that if she develops an ulceration or vesicle to her lips that she should also follow-up.  Strict return precautions given.  The patient is hemodynamically stable and in no acute distress.  She is safe for discharge home with outpatient follow-up at this time.   Final Clinical Impressions(s) / ED Diagnoses   Final diagnoses:  Swelling of both lips    ED Discharge Orders         Ordered    predniSONE (DELTASONE) 10 MG tablet  Daily     04/27/18 0411  Barkley Boards, PA-C 04/27/18 1610    Palumbo, April, MD 04/27/18 [phone]

## 2019-01-20 ENCOUNTER — Other Ambulatory Visit: Payer: Self-pay

## 2019-01-20 ENCOUNTER — Encounter (HOSPITAL_COMMUNITY): Payer: Self-pay

## 2019-01-20 ENCOUNTER — Inpatient Hospital Stay (HOSPITAL_COMMUNITY)
Admission: AD | Admit: 2019-01-20 | Discharge: 2019-01-20 | Disposition: A | Discharge status: Home / Self Care | Payer: Medicaid Other | Attending: Obstetrics and Gynecology | Admitting: Obstetrics and Gynecology

## 2019-01-20 ENCOUNTER — Inpatient Hospital Stay (HOSPITAL_COMMUNITY): Payer: Medicaid Other

## 2019-01-20 DIAGNOSIS — R102 Pelvic and perineal pain: Secondary | ICD-10-CM | POA: Insufficient documentation

## 2019-01-20 DIAGNOSIS — Z6740 Type O blood, Rh positive: Secondary | ICD-10-CM

## 2019-01-20 DIAGNOSIS — Z3A01 Less than 8 weeks gestation of pregnancy: Secondary | ICD-10-CM | POA: Insufficient documentation

## 2019-01-20 DIAGNOSIS — Z3490 Encounter for supervision of normal pregnancy, unspecified, unspecified trimester: Secondary | ICD-10-CM

## 2019-01-20 DIAGNOSIS — O360910 Maternal care for other rhesus isoimmunization, first trimester, not applicable or unspecified: Secondary | ICD-10-CM

## 2019-01-20 DIAGNOSIS — O26891 Other specified pregnancy related conditions, first trimester: Secondary | ICD-10-CM | POA: Diagnosis not present

## 2019-01-20 LAB — CBC WITH DIFFERENTIAL/PLATELET
Abs Immature Granulocytes: 0.01 10*3/uL (ref 0.00–0.07)
Basophils Absolute: 0 10*3/uL (ref 0.0–0.1)
Basophils Relative: 0 %
Eosinophils Absolute: 0.1 10*3/uL (ref 0.0–0.5)
Eosinophils Relative: 1 %
HCT: 32.3 % — ABNORMAL LOW (ref 36.0–46.0)
Hemoglobin: 10.2 g/dL — ABNORMAL LOW (ref 12.0–15.0)
Immature Granulocytes: 0 %
Lymphocytes Relative: 31 %
Lymphs Abs: 2 10*3/uL (ref 0.7–4.0)
MCH: 26.4 pg (ref 26.0–34.0)
MCHC: 31.6 g/dL (ref 30.0–36.0)
MCV: 83.5 fL (ref 80.0–100.0)
Monocytes Absolute: 0.5 10*3/uL (ref 0.1–1.0)
Monocytes Relative: 8 %
Neutro Abs: 4 10*3/uL (ref 1.7–7.7)
Neutrophils Relative %: 60 %
Platelets: 322 10*3/uL (ref 150–400)
RBC: 3.87 MIL/uL (ref 3.87–5.11)
RDW: 15.8 % — ABNORMAL HIGH (ref 11.5–15.5)
WBC: 6.6 10*3/uL (ref 4.0–10.5)
nRBC: 0 % (ref 0.0–0.2)

## 2019-01-20 LAB — URINALYSIS, ROUTINE W REFLEX MICROSCOPIC
Bilirubin Urine: NEGATIVE
Glucose, UA: NEGATIVE mg/dL
Hgb urine dipstick: NEGATIVE
Ketones, ur: NEGATIVE mg/dL
Leukocytes,Ua: NEGATIVE
Nitrite: NEGATIVE
Protein, ur: NEGATIVE mg/dL
Specific Gravity, Urine: 1.027 (ref 1.005–1.030)
pH: 6 (ref 5.0–8.0)

## 2019-01-20 LAB — HCG, QUANTITATIVE, PREGNANCY: hCG, Beta Chain, Quant, S: 8894 m[IU]/mL — ABNORMAL HIGH (ref <5)

## 2019-01-20 LAB — WET PREP, GENITAL
Sperm: NONE SEEN
Trich, Wet Prep: NONE SEEN
Yeast Wet Prep HPF POC: NONE SEEN

## 2019-01-20 LAB — POCT PREGNANCY, URINE: Preg Test, Ur: POSITIVE — AB

## 2019-01-20 NOTE — MAU Note (Signed)
Abdominal cramps that started tonight that have become constant.  HD confirmed her pregnancy yesterday.  No VB.  Took tylenol for the pain with some relief but didn't want to take anymore.  LMP 5/3.

## 2019-01-20 NOTE — Discharge Instructions (Signed)
Safe Medications in Pregnancy  ° °Acne: °Benzoyl Peroxide °Salicylic Acid ° °Backache/Headache: °Tylenol: 2 regular strength every 4 hours OR °             2 Extra strength every 6 hours ° °Colds/Coughs/Allergies: °Benadryl (alcohol free) 25 mg every 6 hours as needed °Breath right strips °Claritin °Cepacol throat lozenges °Chloraseptic throat spray °Cold-Eeze- up to three times per day °Cough drops, alcohol free °Flonase (by prescription only) °Guaifenesin °Mucinex °Robitussin DM (plain only, alcohol free) °Saline nasal spray/drops °Sudafed (pseudoephedrine) & Actifed ** use only after [redacted] weeks gestation and if you do not have high blood pressure °Tylenol °Vicks Vaporub °Zinc lozenges °Zyrtec  ° °Constipation: °Colace °Ducolax suppositories °Fleet enema °Glycerin suppositories °Metamucil °Milk of magnesia °Miralax °Senokot °Smooth move tea ° °Diarrhea: °Kaopectate °Imodium A-D ° °*NO pepto Bismol ° °Hemorrhoids: °Anusol °Anusol HC °Preparation H °Tucks ° °Indigestion: °Tums °Maalox °Mylanta °Zantac  °Pepcid ° °Insomnia: °Benadryl (alcohol free) 25mg every 6 hours as needed °Tylenol PM °Unisom, no Gelcaps ° °Leg Cramps: °Tums °MagGel ° °Nausea/Vomiting:  °Bonine °Dramamine °Emetrol °Ginger extract °Sea bands °Meclizine  °Nausea medication to take during pregnancy:  °Unisom (doxylamine succinate 25 mg tablets) Take one tablet daily at bedtime. If symptoms are not adequately controlled, the dose can be increased to a maximum recommended dose of two tablets daily (1/2 tablet in the morning, 1/2 tablet mid-afternoon and one at bedtime). °Vitamin B6 100mg tablets. Take one tablet twice a day (up to 200 mg per day). ° °Skin Rashes: °Aveeno products °Benadryl cream or 25mg every 6 hours as needed °Calamine Lotion °1% cortisone cream ° °Yeast infection: °Gyne-lotrimin 7 °Monistat 7 ° ° °**If taking multiple medications, please check labels to avoid duplicating the same active ingredients °**take medication as directed on  the label °** Do not exceed 4000 mg of tylenol in 24 hours °**Do not take medications that contain aspirin or ibuprofen ° ° ° ° °First Trimester of Pregnancy °The first trimester of pregnancy is from week 1 until the end of week 13 (months 1 through 3). A week after a sperm fertilizes an egg, the egg will implant on the wall of the uterus. This embryo will begin to develop into a baby. Genes from you and your partner will form the baby. The female genes will determine whether the baby will be a boy or a girl. At 6-8 weeks, the eyes and face will be formed, and the heartbeat can be seen on ultrasound. At the end of 12 weeks, all the baby's organs will be formed. °Now that you are pregnant, you will want to do everything you can to have a healthy baby. Two of the most important things are to get good prenatal care and to follow your health care provider's instructions. Prenatal care is all the medical care you receive before the baby's birth. This care will help prevent, find, and treat any problems during the pregnancy and childbirth. °Body changes during your first trimester °Your body goes through many changes during pregnancy. The changes vary from woman to woman. °· You may gain or lose a couple of pounds at first. °· You may feel sick to your stomach (nauseous) and you may throw up (vomit). If the vomiting is uncontrollable, call your health care provider. °· You may tire easily. °· You may develop headaches that can be relieved by medicines. All medicines should be approved by your health care provider. °· You may urinate more often. Painful urination may mean you have   a bladder infection.  You may develop heartburn as a result of your pregnancy.  You may develop constipation because certain hormones are causing the muscles that push stool through your intestines to slow down.  You may develop hemorrhoids or swollen veins (varicose veins).  Your breasts may begin to grow larger and become tender. Your  nipples may stick out more, and the tissue that surrounds them (areola) may become darker.  Your gums may bleed and may be sensitive to brushing and flossing.  Dark spots or blotches (chloasma, mask of pregnancy) may develop on your face. This will likely fade after the baby is born.  Your menstrual periods will stop.  You may have a loss of appetite.  You may develop cravings for certain kinds of food.  You may have changes in your emotions from day to day, such as being excited to be pregnant or being concerned that something may go wrong with the pregnancy and baby.  You may have more vivid and strange dreams.  You may have changes in your hair. These can include thickening of your hair, rapid growth, and changes in texture. Some women also have hair loss during or after pregnancy, or hair that feels dry or thin. Your hair will most likely return to normal after your baby is born. What to expect at prenatal visits During a routine prenatal visit:  You will be weighed to make sure you and the baby are growing normally.  Your blood pressure will be taken.  Your abdomen will be measured to track your baby's growth.  The fetal heartbeat will be listened to between weeks 10 and 14 of your pregnancy.  Test results from any previous visits will be discussed. Your health care provider may ask you:  How you are feeling.  If you are feeling the baby move.  If you have had any abnormal symptoms, such as leaking fluid, bleeding, severe headaches, or abdominal cramping.  If you are using any tobacco products, including cigarettes, chewing tobacco, and electronic cigarettes.  If you have any questions. Other tests that may be performed during your first trimester include:  Blood tests to find your blood type and to check for the presence of any previous infections. The tests will also be used to check for low iron levels (anemia) and protein on red blood cells (Rh antibodies).  Depending on your risk factors, or if you previously had diabetes during pregnancy, you may have tests to check for high blood sugar that affects pregnant women (gestational diabetes).  Urine tests to check for infections, diabetes, or protein in the urine.  An ultrasound to confirm the proper growth and development of the baby.  Fetal screens for spinal cord problems (spina bifida) and Down syndrome.  HIV (human immunodeficiency virus) testing. Routine prenatal testing includes screening for HIV, unless you choose not to have this test.  You may need other tests to make sure you and the baby are doing well. Follow these instructions at home: Medicines  Follow your health care provider's instructions regarding medicine use. Specific medicines may be either safe or unsafe to take during pregnancy.  Take a prenatal vitamin that contains at least 600 micrograms (mcg) of folic acid.  If you develop constipation, try taking a stool softener if your health care provider approves. Eating and drinking   Eat a balanced diet that includes fresh fruits and vegetables, whole grains, good sources of protein such as meat, eggs, or tofu, and low-fat dairy. Your health  care provider will help you determine the amount of weight gain that is right for you.  Avoid raw meat and uncooked cheese. These carry germs that can cause birth defects in the baby.  Eating four or five small meals rather than three large meals a day may help relieve nausea and vomiting. If you start to feel nauseous, eating a few soda crackers can be helpful. Drinking liquids between meals, instead of during meals, also seems to help ease nausea and vomiting.  Limit foods that are high in fat and processed sugars, such as fried and sweet foods.  To prevent constipation: ? Eat foods that are high in fiber, such as fresh fruits and vegetables, whole grains, and beans. ? Drink enough fluid to keep your urine clear or pale  yellow. Activity  Exercise only as directed by your health care provider. Most women can continue their usual exercise routine during pregnancy. Try to exercise for 30 minutes at least 5 days a week. Exercising will help you: ? Control your weight. ? Stay in shape. ? Be prepared for labor and delivery.  Experiencing pain or cramping in the lower abdomen or lower back is a good sign that you should stop exercising. Check with your health care provider before continuing with normal exercises.  Try to avoid standing for long periods of time. Move your legs often if you must stand in one place for a long time.  Avoid heavy lifting.  Wear low-heeled shoes and practice good posture.  You may continue to have sex unless your health care provider tells you not to. Relieving pain and discomfort  Wear a good support bra to relieve breast tenderness.  Take warm sitz baths to soothe any pain or discomfort caused by hemorrhoids. Use hemorrhoid cream if your health care provider approves.  Rest with your legs elevated if you have leg cramps or low back pain.  If you develop varicose veins in your legs, wear support hose. Elevate your feet for 15 minutes, 3-4 times a day. Limit salt in your diet. Prenatal care  Schedule your prenatal visits by the twelfth week of pregnancy. They are usually scheduled monthly at first, then more often in the last 2 months before delivery.  Write down your questions. Take them to your prenatal visits.  Keep all your prenatal visits as told by your health care provider. This is important. Safety  Wear your seat belt at all times when driving.  Make a list of emergency phone numbers, including numbers for family, friends, the hospital, and police and fire departments. General instructions  Ask your health care provider for a referral to a local prenatal education class. Begin classes no later than the beginning of month 6 of your pregnancy.  Ask for help if  you have counseling or nutritional needs during pregnancy. Your health care provider can offer advice or refer you to specialists for help with various needs.  Do not use hot tubs, steam rooms, or saunas.  Do not douche or use tampons or scented sanitary pads.  Do not cross your legs for long periods of time.  Avoid cat litter boxes and soil used by cats. These carry germs that can cause birth defects in the baby and possibly loss of the fetus by miscarriage or stillbirth.  Avoid all smoking, herbs, alcohol, and medicines not prescribed by your health care provider. Chemicals in these products affect the formation and growth of the baby.  Do not use any products that contain nicotine  or tobacco, such as cigarettes and e-cigarettes. If you need help quitting, ask your health care provider. You may receive counseling support and other resources to help you quit.  Schedule a dentist appointment. At home, brush your teeth with a soft toothbrush and be gentle when you floss. Contact a health care provider if:  You have dizziness.  You have mild pelvic cramps, pelvic pressure, or nagging pain in the abdominal area.  You have persistent nausea, vomiting, or diarrhea.  You have a bad smelling vaginal discharge.  You have pain when you urinate.  You notice increased swelling in your face, hands, legs, or ankles.  You are exposed to fifth disease or chickenpox.  You are exposed to [place] measles (rubella) and have never had it. Get help right away if:  You have a fever.  You are leaking fluid from your vagina.  You have spotting or bleeding from your vagina.  You have severe abdominal cramping or pain.  You have rapid weight gain or loss.  You vomit blood or material that looks like coffee grounds.  You develop a severe headache.  You have shortness of breath.  You have any kind of trauma, such as from a fall or a car accident. Summary  The first trimester of pregnancy is  from week 1 until the end of week 13 (months 1 through 3).  Your body goes through many changes during pregnancy. The changes vary from woman to woman.  You will have routine prenatal visits. During those visits, your health care provider will examine you, discuss any test results you may have, and talk with you about how you are feeling. This information is not intended to replace advice given to you by your health care provider. Make sure you discuss any questions you have with your health care provider. Document Released: 06/22/2001 Document Revised: 06/10/2017 Document Reviewed: 06/09/2016 [employer] Patient Education  2020 [employer]

## 2019-01-20 NOTE — MAU Provider Note (Signed)
History     CSN: [id number]  Arrival date and time: 01/20/19 2121   First Provider Initiated Contact with Patient 01/20/19 2157      Chief Complaint  Patient presents with  . Pelvic Pain   [name] is a 33 y.o. 33 y.o. [id number] at 9618w3d who presents today with cramping. She denies any vaginal bleeding.   Pelvic Pain The patient's primary symptoms include pelvic pain. The patient's pertinent negatives include no vaginal discharge. This is a new problem. The current episode started today. The problem occurs constantly. The problem has been unchanged. The problem affects both sides. She is pregnant. Pertinent negatives include no chills, dysuria, fever, hematuria, nausea or vomiting. The vaginal discharge was normal. There has been no bleeding. Nothing aggravates the symptoms. She has tried acetaminophen for the symptoms. The treatment provided mild relief. She uses a contraceptive ring (Stopped using NuvaRing in May) for contraception. Her menstrual history has been irregular (LMP 12/13/2018).    OB History    Gravida  6   Para  3   Term  3   Preterm  0   AB  2   Living  3     SAB  0   TAB  2   Ectopic  0   Multiple  0   Live Births  3           Past Medical History:  Diagnosis Date  . Acne   . Anemia   . Chlamydia     Past Surgical History:  Procedure Laterality Date  . CESAREAN SECTION    . DILATION AND CURETTAGE OF UTERUS      Family History  Adopted: Yes  Problem Relation Age of Onset  . Other Neg Hx     Social History   Tobacco Use  . Smoking status: Never Smoker  . Smokeless tobacco: Never Used  Substance Use Topics  . Alcohol use: No  . Drug use: No    Allergies: No Known Allergies  No medications prior to admission.    Review of Systems  Constitutional: Negative for chills and fever.  Gastrointestinal: Negative for nausea and vomiting.  Genitourinary: Positive for pelvic pain. Negative for dysuria, hematuria, vaginal  bleeding and vaginal discharge.   Physical Exam   Blood pressure 117/65, pulse 69, temperature 98.2 F (36.8 C), resp. rate 19, weight 104.8 kg, last menstrual period 11/12/2018, SpO2 100 %.  Physical Exam  Nursing note and vitals reviewed. Constitutional: She is oriented to person, place, and time. She appears well-developed and well-nourished. No distress.  HENT:  Head: Normocephalic.  Cardiovascular: Normal rate.  Respiratory: Effort normal.  GI: Soft. There is no abdominal tenderness. There is no rebound.  Neurological: She is alert and oriented to person, place, and time.  Skin: Skin is warm and dry.  Psychiatric: She has a normal mood and affect.   Results for orders placed or performed during the hospital encounter of 01/20/19 (from the past 24 hour(s))  Pregnancy, urine POC     Status: Abnormal   Collection Time: 01/20/19  9:42 PM  Result Value Ref Range   Preg Test, Ur POSITIVE (A) NEGATIVE  Urinalysis, Routine w reflex microscopic     Status: None   Collection Time: 01/20/19  9:47 PM  Result Value Ref Range   Color, Urine YELLOW YELLOW   APPearance CLEAR CLEAR   Specific Gravity, Urine 1.027 1.005 - 1.030   pH 6.0 5.0 - 8.0   Glucose, UA NEGATIVE  NEGATIVE mg/dL   Hgb urine dipstick NEGATIVE NEGATIVE   Bilirubin Urine NEGATIVE NEGATIVE   Ketones, ur NEGATIVE NEGATIVE mg/dL   Protein, ur NEGATIVE NEGATIVE mg/dL   Nitrite NEGATIVE NEGATIVE   Leukocytes,Ua NEGATIVE NEGATIVE  Wet prep, genital     Status: Abnormal   Collection Time: 01/20/19 10:05 PM  Result Value Ref Range   Yeast Wet Prep HPF POC NONE SEEN NONE SEEN   Trich, Wet Prep NONE SEEN NONE SEEN   Clue Cells Wet Prep HPF POC PRESENT (A) NONE SEEN   WBC, Wet Prep HPF POC MODERATE (A) NONE SEEN   Sperm NONE SEEN   CBC with Differential/Platelet     Status: Abnormal   Collection Time: 01/20/19 10:17 PM  Result Value Ref Range   WBC 6.6 4.0 - 10.5 K/uL   RBC 3.87 3.87 - 5.11 MIL/uL   Hemoglobin 10.2 (L)  12.0 - 15.0 g/dL   HCT 32.3 (L) 36.0 - 46.0 %   MCV 83.5 80.0 - 100.0 fL   MCH 26.4 26.0 - 34.0 pg   MCHC 31.6 30.0 - 36.0 g/dL   RDW 15.8 (H) 11.5 - 15.5 %   Platelets 322 150 - 400 K/uL   nRBC 0.0 0.0 - 0.2 %   Neutrophils Relative % 60 %   Neutro Abs 4.0 1.7 - 7.7 K/uL   Lymphocytes Relative 31 %   Lymphs Abs 2.0 0.7 - 4.0 K/uL   Monocytes Relative 8 %   Monocytes Absolute 0.5 0.1 - 1.0 K/uL   Eosinophils Relative 1 %   Eosinophils Absolute 0.1 0.0 - 0.5 K/uL   Basophils Relative 0 %   Basophils Absolute 0.0 0.0 - 0.1 K/uL   Immature Granulocytes 0 %   Abs Immature Granulocytes 0.01 0.00 - 0.07 K/uL   US Ob Less Than 14 Weeks With Ob Transvaginal  Result Date: 01/20/2019 CLINICAL DATA:  Initial evaluation for acute cramping, early pregnancy. EXAM: OBSTETRIC <14 WK [place] AND TRANSVAGINAL OB US TECHNIQUE: Both transabdominal and transvaginal ultrasound examinations were performed for complete evaluation of the gestation as well as the maternal uterus, adnexal regions, and pelvic cul-de-sac. Transvaginal technique was performed to assess early pregnancy. COMPARISON:  None available. FINDINGS: Intrauterine gestational sac: Single Yolk sac:  Present Embryo: Present, measuring approximately 1.6 mm, too early to calculate gestational age given small size. Cardiac Activity: Not visible. Heart Rate: N/A  bpm MSD: 10.3 mm   5 w   5 d Subchorionic hemorrhage:  None visualized. Maternal uterus/adnexae: Ovaries are normal in appearance bilaterally. Small corpus luteal cyst noted on the right. No free fluid within the pelvis. IMPRESSION: 1. Single intrauterine gestational sac with internal yolk sac, and probable less than 2 mm fetal pole. Cardiac activity not detectable as of yet given the small fetal pole size. Recommend follow-up quantitative B-HCG levels and follow-up US in 14 days to assess viability. 2. No other acute maternal uterine or adnexal abnormality identified. Electronically Signed   By:  Jeannine Boga M.D.   On: 01/20/2019 22:53   MAU Course  Procedures  MDM   Assessment and Plan   1. Intrauterine pregnancy   2. Pelvic pain in pregnancy, antepartum, first trimester   3. [redacted] weeks gestation of pregnancy   4. Type O blood, Rh positive    DC home Comfort measures reviewed  1st Trimester precautions  Bleeding precautions RX: no new RX Return to MAU as needed FU with OB as planned  Follow-up Information  Harper, Charles A, MD Follow up.   Specialty: Obstetrics [facility] Contact information: [address] [place] [address] [phone]          Thressa ShellerHeather Humzah Harty DNP, CNM  01/20/19  11:08 PM

## 2019-01-22 LAB — CULTURE, OB URINE

## 2019-01-23 ENCOUNTER — Other Ambulatory Visit: Payer: Self-pay | Admitting: Advanced Practice Midwife

## 2019-01-23 LAB — GC/CHLAMYDIA PROBE AMP (~~LOC~~) NOT AT ARMC
Chlamydia: NEGATIVE
Neisseria Gonorrhea: NEGATIVE

## 2019-01-24 ENCOUNTER — Encounter (HOSPITAL_COMMUNITY): Payer: Self-pay

## 2019-01-24 ENCOUNTER — Inpatient Hospital Stay (HOSPITAL_COMMUNITY)
Admission: AD | Admit: 2019-01-24 | Discharge: 2019-01-24 | Disposition: A | Discharge status: Home / Self Care | Payer: Medicaid Other | Attending: Obstetrics and Gynecology | Admitting: Obstetrics and Gynecology

## 2019-01-24 ENCOUNTER — Other Ambulatory Visit: Payer: Self-pay

## 2019-01-24 DIAGNOSIS — R102 Pelvic and perineal pain: Secondary | ICD-10-CM | POA: Insufficient documentation

## 2019-01-24 DIAGNOSIS — O26891 Other specified pregnancy related conditions, first trimester: Secondary | ICD-10-CM | POA: Diagnosis not present

## 2019-01-24 DIAGNOSIS — Z3A01 Less than 8 weeks gestation of pregnancy: Secondary | ICD-10-CM | POA: Diagnosis not present

## 2019-01-24 DIAGNOSIS — Z3490 Encounter for supervision of normal pregnancy, unspecified, unspecified trimester: Secondary | ICD-10-CM

## 2019-01-24 LAB — URINALYSIS, ROUTINE W REFLEX MICROSCOPIC
Bilirubin Urine: NEGATIVE
Glucose, UA: NEGATIVE mg/dL
Hgb urine dipstick: NEGATIVE
Ketones, ur: NEGATIVE mg/dL
Nitrite: NEGATIVE
Protein, ur: NEGATIVE mg/dL
Specific Gravity, Urine: 1.003 — ABNORMAL LOW (ref 1.005–1.030)
pH: 8 (ref 5.0–8.0)

## 2019-01-24 LAB — CBC WITH DIFFERENTIAL/PLATELET
Abs Immature Granulocytes: 0.02 10*3/uL (ref 0.00–0.07)
Basophils Absolute: 0 10*3/uL (ref 0.0–0.1)
Basophils Relative: 0 %
Eosinophils Absolute: 0.1 10*3/uL (ref 0.0–0.5)
Eosinophils Relative: 1 %
HCT: 33.9 % — ABNORMAL LOW (ref 36.0–46.0)
Hemoglobin: 10.8 g/dL — ABNORMAL LOW (ref 12.0–15.0)
Immature Granulocytes: 0 %
Lymphocytes Relative: 21 %
Lymphs Abs: 1.3 10*3/uL (ref 0.7–4.0)
MCH: 26.5 pg (ref 26.0–34.0)
MCHC: 31.9 g/dL (ref 30.0–36.0)
MCV: 83.3 fL (ref 80.0–100.0)
Monocytes Absolute: 0.4 10*3/uL (ref 0.1–1.0)
Monocytes Relative: 7 %
Neutro Abs: 4.3 10*3/uL (ref 1.7–7.7)
Neutrophils Relative %: 71 %
Platelets: 326 10*3/uL (ref 150–400)
RBC: 4.07 MIL/uL (ref 3.87–5.11)
RDW: 15.9 % — ABNORMAL HIGH (ref 11.5–15.5)
WBC: 6.1 10*3/uL (ref 4.0–10.5)
nRBC: 0 % (ref 0.0–0.2)

## 2019-01-24 LAB — HCG, QUANTITATIVE, PREGNANCY: hCG, Beta Chain, Quant, S: 18917 m[IU]/mL — ABNORMAL HIGH (ref <5)

## 2019-01-24 MED ORDER — CYCLOBENZAPRINE HCL 10 MG PO TABS: 10.0000 mg | ORAL_TABLET | Freq: Two times a day (BID) | ORAL | 0 refills | Status: DC | PRN

## 2019-01-24 MED ORDER — CYCLOBENZAPRINE HCL 10 MG PO TABS
10.0000 mg | ORAL_TABLET | Freq: Once | ORAL | Status: AC
  Administered 2019-01-24: 10 mg via ORAL
  Filled 2019-01-24: qty 1

## 2019-01-24 NOTE — MAU Provider Note (Signed)
History     CSN: [id number]  Arrival date and time: 01/24/19 1359   First Provider Initiated Contact with Patient 01/24/19 1427      Chief Complaint  Patient presents with  . Pelvic Pain   [name] is a 33 y.o. [id number] at [redacted]w[redacted]d who presents today with cramping and back pain. She was seen for this on 01/20/2019. She reports that it has continued, and has worsened. She states that she has the pain all day. IUP was seen on [place] on 01/20/2019. She states that it feels like menstrual cramps all day.   Pelvic Pain The patient's primary symptoms include pelvic pain. The patient's pertinent negatives include no vaginal discharge. This is a new problem. The current episode started in the past 7 days. The problem occurs constantly. The problem has been gradually worsening. The problem affects both sides. She is pregnant. Associated symptoms include back pain. Pertinent negatives include no chills, constipation, diarrhea, dysuria, fever, frequency, nausea or vomiting. The vaginal discharge was normal. There has been no bleeding. Exacerbated by: walking or increased activity  She has tried acetaminophen for the symptoms. The treatment provided mild relief.    OB History    Gravida  6   Para  3   Term  3   Preterm  0   AB  2   Living  3     SAB  0   TAB  2   Ectopic  0   Multiple  0   Live Births  3           Past Medical History:  Diagnosis Date  . Acne   . Anemia   . Chlamydia     Past Surgical History:  Procedure Laterality Date  . CESAREAN SECTION    . DILATION AND CURETTAGE OF UTERUS      Family History  Adopted: Yes  Problem Relation Age of Onset  . Other Neg Hx     Social History   Tobacco Use  . Smoking status: Never Smoker  . Smokeless tobacco: Never Used  Substance Use Topics  . Alcohol use: No  . Drug use: No    Allergies: No Known Allergies  No medications prior to admission.    Review of Systems  Constitutional: Negative for chills  and fever.  Gastrointestinal: Negative for constipation, diarrhea, nausea and vomiting.       "gassy"  Genitourinary: Positive for pelvic pain. Negative for dysuria, frequency, vaginal bleeding and vaginal discharge.  Musculoskeletal: Positive for back pain.   Physical Exam   Blood pressure 130/66, pulse 79, temperature 98 F (36.7 C), temperature source Oral, resp. rate 18, height 5\' 4"  (1.626 m), weight 102.1 kg, last menstrual period 12/13/2018, SpO2 100 %.  Physical Exam  Nursing note and vitals reviewed. Constitutional: She is oriented to person, place, and time. She appears well-developed and well-nourished. No distress.  HENT:  Head: Normocephalic.  Cardiovascular: Normal rate.  Respiratory: Effort normal.  GI: Soft. There is no abdominal tenderness. There is no rebound.  Neurological: She is alert and oriented to person, place, and time.  Skin: Skin is warm and dry.  Psychiatric: She has a normal mood and affect.   Results for orders placed or performed during the hospital encounter of 01/24/19 (from the past 24 hour(s))  Urinalysis, Routine w reflex microscopic     Status: Abnormal   Collection Time: 01/24/19  2:21 PM  Result Value Ref Range   Color, Urine YELLOW YELLOW  APPearance CLEAR CLEAR   Specific Gravity, Urine 1.003 (L) 1.005 - 1.030   pH 8.0 5.0 - 8.0   Glucose, UA NEGATIVE NEGATIVE mg/dL   Hgb urine dipstick NEGATIVE NEGATIVE   Bilirubin Urine NEGATIVE NEGATIVE   Ketones, ur NEGATIVE NEGATIVE mg/dL   Protein, ur NEGATIVE NEGATIVE mg/dL   Nitrite NEGATIVE NEGATIVE   Leukocytes,Ua TRACE (A) NEGATIVE   WBC, UA 0-5 0 - 5 WBC/hpf   Bacteria, UA FEW (A) NONE SEEN   Squamous Epithelial / LPF 0-5 0 - 5   Mucus PRESENT   CBC with Differential/Platelet     Status: Abnormal   Collection Time: 01/24/19  3:15 PM  Result Value Ref Range   WBC 6.1 4.0 - 10.5 K/uL   RBC 4.07 3.87 - 5.11 MIL/uL   Hemoglobin 10.8 (L) 12.0 - 15.0 g/dL   HCT 40.933.9 (L) 81.136.0 - 91.446.0 %    MCV 83.3 80.0 - 100.0 fL   MCH 26.5 26.0 - 34.0 pg   MCHC 31.9 30.0 - 36.0 g/dL   RDW 78.215.9 (H) 95.611.5 - 21.315.5 %   Platelets 326 150 - 400 K/uL   nRBC 0.0 0.0 - 0.2 %   Neutrophils Relative % 71 %   Neutro Abs 4.3 1.7 - 7.7 K/uL   Lymphocytes Relative 21 %   Lymphs Abs 1.3 0.7 - 4.0 K/uL   Monocytes Relative 7 %   Monocytes Absolute 0.4 0.1 - 1.0 K/uL   Eosinophils Relative 1 %   Eosinophils Absolute 0.1 0.0 - 0.5 K/uL   Basophils Relative 0 %   Basophils Absolute 0.0 0.0 - 0.1 K/uL   Immature Granulocytes 0 %   Abs Immature Granulocytes 0.02 0.00 - 0.07 K/uL  hCG, quantitative, pregnancy     Status: Abnormal   Collection Time: 01/24/19  3:15 PM  Result Value Ref Range   hCG, Beta Chain, Quant, S 18,917 (H) <5 mIU/mL    MAU Course  Procedures  MDM Patient has had flexeril here. She reports that her pain has improved.  Assessment and Plan   1. Pelvic pain in pregnancy, antepartum, first trimester   2. Intrauterine pregnancy   3. [redacted] weeks gestation of pregnancy    DC home Comfort measures reviewed  1st Trimester precautions  Bleeding precautions RX: flexeril PRN #20  Urine Culture pending  Return to MAU as needed FU with OB as planned  Follow-up Information    [facility] Follow [facility].   Specialty: Radiology Contact information: [address] [id number] [place] [phone]         Thressa ShellerHeather Reshonda Koerber DNP, CNM  01/24/19  4:49 PM

## 2019-01-24 NOTE — MAU Note (Signed)
[name] is a 32 y.o. at [redacted]w[redacted]d here in MAU reporting: been having cramping, states she has been seen for this. Has tried tylenol and that helps a little bit. Cramps last all day. Not having any bleeding, no abnormal discharge.  Onset of complaint: ongoing  Pain score: 4/10  Vitals:   01/24/19 1419  BP: 130/66  Pulse: 79  Resp: 18  Temp: 98 F (36.7 C)  SpO2: 100%      Lab orders placed from triage: UA

## 2019-01-24 NOTE — Discharge Instructions (Signed)
First Trimester of Pregnancy The first trimester of pregnancy is from week 1 until the end of week 13 (months 1 through 3). A week after a sperm fertilizes an egg, the egg will implant on the wall of the uterus. This embryo will begin to develop into a baby. Genes from you and your partner will form the baby. The female genes will determine whether the baby will be a boy or a girl. At 6-8 weeks, the eyes and face will be formed, and the heartbeat can be seen on ultrasound. At the end of 12 weeks, all the baby's organs will be formed. Now that you are pregnant, you will want to do everything you can to have a healthy baby. Two of the most important things are to get good prenatal care and to follow your health care provider's instructions. Prenatal care is all the medical care you receive before the baby's birth. This care will help prevent, find, and treat any problems during the pregnancy and childbirth. Body changes during your first trimester Your body goes through many changes during pregnancy. The changes vary from woman to woman.  You may gain or lose a couple of pounds at first.  You may feel sick to your stomach (nauseous) and you may throw up (vomit). If the vomiting is uncontrollable, call your health care provider.  You may tire easily.  You may develop headaches that can be relieved by medicines. All medicines should be approved by your health care provider.  You may urinate more often. Painful urination may mean you have a bladder infection.  You may develop heartburn as a result of your pregnancy.  You may develop constipation because certain hormones are causing the muscles that push stool through your intestines to slow down.  You may develop hemorrhoids or swollen veins (varicose veins).  Your breasts may begin to grow larger and become tender. Your nipples may stick out more, and the tissue that surrounds them (areola) may become darker.  Your gums may bleed and may be  sensitive to brushing and flossing.  Dark spots or blotches (chloasma, mask of pregnancy) may develop on your face. This will likely fade after the baby is born.  Your menstrual periods will stop.  You may have a loss of appetite.  You may develop cravings for certain kinds of food.  You may have changes in your emotions from day to day, such as being excited to be pregnant or being concerned that something may go wrong with the pregnancy and baby.  You may have more vivid and strange dreams.  You may have changes in your hair. These can include thickening of your hair, rapid growth, and changes in texture. Some women also have hair loss during or after pregnancy, or hair that feels dry or thin. Your hair will most likely return to normal after your baby is born. What to expect at prenatal visits During a routine prenatal visit:  You will be weighed to make sure you and the baby are growing normally.  Your blood pressure will be taken.  Your abdomen will be measured to track your baby's growth.  The fetal heartbeat will be listened to between weeks 10 and 14 of your pregnancy.  Test results from any previous visits will be discussed. Your health care provider may ask you:  How you are feeling.  If you are feeling the baby move.  If you have had any abnormal symptoms, such as leaking fluid, bleeding, severe headaches, or abdominal   cramping.  If you are using any tobacco products, including cigarettes, chewing tobacco, and electronic cigarettes.  If you have any questions. Other tests that may be performed during your first trimester include:  Blood tests to find your blood type and to check for the presence of any previous infections. The tests will also be used to check for low iron levels (anemia) and protein on red blood cells (Rh antibodies). Depending on your risk factors, or if you previously had diabetes during pregnancy, you may have tests to check for high blood sugar  that affects pregnant women (gestational diabetes).  Urine tests to check for infections, diabetes, or protein in the urine.  An ultrasound to confirm the proper growth and development of the baby.  Fetal screens for spinal cord problems (spina bifida) and Down syndrome.  HIV (human immunodeficiency virus) testing. Routine prenatal testing includes screening for HIV, unless you choose not to have this test.  You may need other tests to make sure you and the baby are doing well. Follow these instructions at home: Medicines  Follow your health care provider's instructions regarding medicine use. Specific medicines may be either safe or unsafe to take during pregnancy.  Take a prenatal vitamin that contains at least 600 micrograms (mcg) of folic acid.  If you develop constipation, try taking a stool softener if your health care provider approves. Eating and drinking   Eat a balanced diet that includes fresh fruits and vegetables, whole grains, good sources of protein such as meat, eggs, or tofu, and low-fat dairy. Your health care provider will help you determine the amount of weight gain that is right for you.  Avoid raw meat and uncooked cheese. These carry germs that can cause birth defects in the baby.  Eating four or five small meals rather than three large meals a day may help relieve nausea and vomiting. If you start to feel nauseous, eating a few soda crackers can be helpful. Drinking liquids between meals, instead of during meals, also seems to help ease nausea and vomiting.  Limit foods that are high in fat and processed sugars, such as fried and sweet foods.  To prevent constipation: ? Eat foods that are high in fiber, such as fresh fruits and vegetables, whole grains, and beans. ? Drink enough fluid to keep your urine clear or pale yellow. Activity  Exercise only as directed by your health care provider. Most women can continue their usual exercise routine during  pregnancy. Try to exercise for 30 minutes at least 5 days a week. Exercising will help you: ? Control your weight. ? Stay in shape. ? Be prepared for labor and delivery.  Experiencing pain or cramping in the lower abdomen or lower back is a good sign that you should stop exercising. Check with your health care provider before continuing with normal exercises.  Try to avoid standing for long periods of time. Move your legs often if you must stand in one place for a long time.  Avoid heavy lifting.  Wear low-heeled shoes and practice good posture.  You may continue to have sex unless your health care provider tells you not to. Relieving pain and discomfort  Wear a good support bra to relieve breast tenderness.  Take warm sitz baths to soothe any pain or discomfort caused by hemorrhoids. Use hemorrhoid cream if your health care provider approves.  Rest with your legs elevated if you have leg cramps or low back pain.  If you develop varicose veins in   your legs, wear support hose. Elevate your feet for 15 minutes, 3-4 times a day. Limit salt in your diet. Prenatal care  Schedule your prenatal visits by the twelfth week of pregnancy. They are usually scheduled monthly at first, then more often in the last 2 months before delivery.  Write down your questions. Take them to your prenatal visits.  Keep all your prenatal visits as told by your health care provider. This is important. Safety  Wear your seat belt at all times when driving.  Make a list of emergency phone numbers, including numbers for family, friends, the hospital, and police and fire departments. General instructions  Ask your health care provider for a referral to a local prenatal education class. Begin classes no later than the beginning of month 6 of your pregnancy.  Ask for help if you have counseling or nutritional needs during pregnancy. Your health care provider can offer advice or refer you to specialists for help  with various needs.  Do not use hot tubs, steam rooms, or saunas.  Do not douche or use tampons or scented sanitary pads.  Do not cross your legs for long periods of time.  Avoid cat litter boxes and soil used by cats. These carry germs that can cause birth defects in the baby and possibly loss of the fetus by miscarriage or stillbirth.  Avoid all smoking, herbs, alcohol, and medicines not prescribed by your health care provider. Chemicals in these products affect the formation and growth of the baby.  Do not use any products that contain nicotine or tobacco, such as cigarettes and e-cigarettes. If you need help quitting, ask your health care provider. You may receive counseling support and other resources to help you quit.  Schedule a dentist appointment. At home, brush your teeth with a soft toothbrush and be gentle when you floss. Contact a health care provider if:  You have dizziness.  You have mild pelvic cramps, pelvic pressure, or nagging pain in the abdominal area.  You have persistent nausea, vomiting, or diarrhea.  You have a bad smelling vaginal discharge.  You have pain when you urinate.  You notice increased swelling in your face, hands, legs, or ankles.  You are exposed to fifth disease or chickenpox.  You are exposed to German measles (rubella) and have never had it. Get help right away if:  You have a fever.  You are leaking fluid from your vagina.  You have spotting or bleeding from your vagina.  You have severe abdominal cramping or pain.  You have rapid weight gain or loss.  You vomit blood or material that looks like coffee grounds.  You develop a severe headache.  You have shortness of breath.  You have any kind of trauma, such as from a fall or a car accident. Summary  The first trimester of pregnancy is from week 1 until the end of week 13 (months 1 through 3).  Your body goes through many changes during pregnancy. The changes vary from  woman to woman.  You will have routine prenatal visits. During those visits, your health care provider will examine you, discuss any test results you may have, and talk with you about how you are feeling. This information is not intended to replace advice given to you by your health care provider. Make sure you discuss any questions you have with your health care provider. Document Released: 06/22/2001 Document Revised: 06/10/2017 Document Reviewed: 06/09/2016 Elsevier Patient Education  2020 [employer]  

## 2019-01-27 LAB — CULTURE, OB URINE: Culture: 1000 — AB

## 2019-01-28 ENCOUNTER — Encounter: Payer: Self-pay | Admitting: Student

## 2019-01-28 DIAGNOSIS — R8271 Bacteriuria: Secondary | ICD-10-CM | POA: Insufficient documentation

## 2019-02-05 ENCOUNTER — Ambulatory Visit (HOSPITAL_COMMUNITY)
Admission: RE | Admit: 2019-02-05 | Discharge: 2019-02-05 | Disposition: A | Discharge status: Home / Self Care | Payer: Medicaid Other | Source: Ambulatory Visit | Attending: Advanced Practice Midwife | Admitting: Advanced Practice Midwife

## 2019-02-05 ENCOUNTER — Ambulatory Visit (INDEPENDENT_AMBULATORY_CARE_PROVIDER_SITE_OTHER): Payer: Medicaid Other | Admitting: General Practice

## 2019-02-05 ENCOUNTER — Other Ambulatory Visit: Payer: Self-pay

## 2019-02-05 DIAGNOSIS — Z3A01 Less than 8 weeks gestation of pregnancy: Secondary | ICD-10-CM | POA: Diagnosis present

## 2019-02-05 DIAGNOSIS — R102 Pelvic and perineal pain: Secondary | ICD-10-CM | POA: Insufficient documentation

## 2019-02-05 DIAGNOSIS — Z3490 Encounter for supervision of normal pregnancy, unspecified, unspecified trimester: Secondary | ICD-10-CM | POA: Insufficient documentation

## 2019-02-05 DIAGNOSIS — O26891 Other specified pregnancy related conditions, first trimester: Secondary | ICD-10-CM | POA: Insufficient documentation

## 2019-02-05 DIAGNOSIS — Z712 Person consulting for explanation of examination or test findings: Secondary | ICD-10-CM

## 2019-02-05 DIAGNOSIS — B951 Streptococcus, group B, as the cause of diseases classified elsewhere: Secondary | ICD-10-CM

## 2019-02-05 DIAGNOSIS — O2341 Unspecified infection of urinary tract in pregnancy, first trimester: Secondary | ICD-10-CM

## 2019-02-05 MED ORDER — AMOXICILLIN 500 MG PO CAPS: 500.0000 mg | ORAL_CAPSULE | Freq: Three times a day (TID) | ORAL | 0 refills | Status: AC

## 2019-02-05 NOTE — Progress Notes (Signed)
Patient presents to office today for ultrasound results. Reviewed with Dr Hulan Fray who finds living IUP. Patient should begin prenatal care. Patient does have GBS UTI & needs amoxicillin 500mg  TID x 7 days per Dr Hulan Fray.  Informed patient of results, reviewed dating, & provided pictures. Discussed GBS UTI and prescription sent to pharmacy. Patient verbalized understanding to all & plans to start care at [facility] office.  Koren Bound RN BSN 02/05/19

## 2019-02-09 NOTE — Progress Notes (Signed)
I have reviewed this chart and agree with the RN/CMA assessment and management.    Trisha Ken C Joshwa Hemric, MD, FACOG Attending Physician, [facility]  

## 2019-02-20 ENCOUNTER — Ambulatory Visit: Payer: Medicaid Other

## 2019-02-20 DIAGNOSIS — O09899 Supervision of other high risk pregnancies, unspecified trimester: Secondary | ICD-10-CM

## 2019-02-20 DIAGNOSIS — Z3480 Encounter for supervision of other normal pregnancy, unspecified trimester: Secondary | ICD-10-CM | POA: Insufficient documentation

## 2019-02-20 DIAGNOSIS — Z98891 History of uterine scar from previous surgery: Secondary | ICD-10-CM | POA: Insufficient documentation

## 2019-02-20 DIAGNOSIS — R8271 Bacteriuria: Secondary | ICD-10-CM

## 2019-02-20 MED ORDER — PRENATAL 27-0.8 MG PO TABS: 1.0000 | ORAL_TABLET | Freq: Every day | ORAL | 11 refills | Status: DC

## 2019-02-20 NOTE — Progress Notes (Signed)
Patient seen and assessed by nursing staff during this encounter. I have reviewed the chart and agree with the documentation and plan.  Mora Bellman, MD 02/20/2019 10:41 AM

## 2019-02-20 NOTE — Addendum Note (Signed)
Addended by: Lucianne Lei on: 02/20/2019 02:40 PM   Modules accepted: Orders

## 2019-02-20 NOTE — Progress Notes (Signed)
  Virtual Visit via Telephone Note  I connected with Abelardo Diesel on 02/20/19 at  8:15 AM EDT by telephone and verified that I am speaking with the correct person using two identifiers.  Location: [facility] Patient: [name] Provider: NOB Nurse Intake   I discussed the limitations, risks, security and privacy concerns of performing an evaluation and management service by telephone and the availability of in person appointments. I also discussed with the patient that there may be a patient responsible charge related to this service. The patient expressed understanding and agreed to proceed.   History of Present Illness: PRENATAL INTAKE SUMMARY  [name] presents today New OB Nurse Interview.  OB History    Gravida  6   Para  3   Term  3   Preterm  0   AB  2   Living  3     SAB  0   TAB  2   Ectopic  0   Multiple  0   Live Births  3          I have reviewed the patient's medical, obstetrical, social, and family histories, medications, and available lab results.  SUBJECTIVE She has no unusual complaints   Observations/Objective: Initial nurse interview for history/labs (New OB)  EDD: 09-19-2019 GA: [redacted]w[redacted]d GP: Madison.Haley  GENERAL APPEARANCE: alert, well appearing  Assessment and Plan: Normal pregnancy Patient desires genetic screening, was adopted and unsure about a lot of family history. Sent babyscripts link to e-mail, advised that BP cuff will be sent at NOB appt on 02-28-19.  Follow Up Instructions:   I discussed the assessment and treatment plan with the patient. The patient was provided an opportunity to ask questions and all were answered. The patient agreed with the plan and demonstrated an understanding of the instructions.   The patient was advised to call back or seek an in-person evaluation if the symptoms worsen or if the condition fails to improve as anticipated.  I provided 15 minutes of non-face-to-face time during this encounter.   Hinton Lovely, RN

## 2019-02-27 ENCOUNTER — Encounter (HOSPITAL_COMMUNITY): Payer: Self-pay | Admitting: *Deleted

## 2019-02-27 ENCOUNTER — Inpatient Hospital Stay (HOSPITAL_COMMUNITY)
Admission: AD | Admit: 2019-02-27 | Discharge: 2019-02-27 | Disposition: A | Discharge status: Home / Self Care | Payer: Medicaid Other | Attending: Obstetrics and Gynecology | Admitting: Obstetrics and Gynecology

## 2019-02-27 ENCOUNTER — Other Ambulatory Visit: Payer: Self-pay

## 2019-02-27 DIAGNOSIS — O209 Hemorrhage in early pregnancy, unspecified: Secondary | ICD-10-CM | POA: Diagnosis not present

## 2019-02-27 DIAGNOSIS — O26891 Other specified pregnancy related conditions, first trimester: Secondary | ICD-10-CM | POA: Insufficient documentation

## 2019-02-27 DIAGNOSIS — R109 Unspecified abdominal pain: Secondary | ICD-10-CM | POA: Insufficient documentation

## 2019-02-27 DIAGNOSIS — Z3A10 10 weeks gestation of pregnancy: Secondary | ICD-10-CM | POA: Diagnosis not present

## 2019-02-27 LAB — URINALYSIS, ROUTINE W REFLEX MICROSCOPIC
Bacteria, UA: NONE SEEN
Bilirubin Urine: NEGATIVE
Glucose, UA: NEGATIVE mg/dL
Ketones, ur: NEGATIVE mg/dL
Leukocytes,Ua: NEGATIVE
Nitrite: NEGATIVE
Protein, ur: NEGATIVE mg/dL
Specific Gravity, Urine: 1.01 (ref 1.005–1.030)
pH: 6 (ref 5.0–8.0)

## 2019-02-27 NOTE — Discharge Instructions (Signed)
Subchorionic Hematoma ° °A subchorionic hematoma is a gathering of blood between the outer wall of the embryo (chorion) and the inner wall of the womb (uterus). °This condition can cause vaginal bleeding. If they cause little or no vaginal bleeding, early small hematomas usually shrink on their own and do not affect your baby or pregnancy. When bleeding starts later in pregnancy, or if the hematoma is larger or occurs in older pregnant women, the condition may be more serious. Larger hematomas may get bigger, which increases the chances of miscarriage. This condition also increases the risk of: °· Premature separation of the placenta from the uterus. °· Premature (preterm) labor. °· Stillbirth. °What are the causes? °The exact cause of this condition is not known. It occurs when blood is trapped between the placenta and the uterine wall because the placenta has separated from the original site of implantation. °What increases the risk? °You are more likely to develop this condition if: °· You were treated with fertility medicines. °· You conceived through in vitro fertilization (IVF). °What are the signs or symptoms? °Symptoms of this condition include: °· Vaginal spotting or bleeding. °· Contractions of the uterus. These cause abdominal pain. °Sometimes you may have no symptoms and the bleeding may only be seen when ultrasound images are taken (transvaginal ultrasound). °How is this diagnosed? °This condition is diagnosed based on a physical exam. This includes a pelvic exam. You may also have other tests, including: °· Blood tests. °· Urine tests. °· Ultrasound of the abdomen. °How is this treated? °Treatment for this condition can vary. Treatment may include: °· Watchful waiting. You will be monitored closely for any changes in bleeding. During this stage: °? The hematoma may be reabsorbed by the body. °? The hematoma may separate the fluid-filled space containing the embryo (gestational sac) from the wall of the  womb (endometrium). °· Medicines. °· Activity restriction. This may be needed until the bleeding stops. °Follow these instructions at home: °· Stay on bed rest if told to do so by your health care provider. °· Do not lift anything that is heavier than 10 lbs. (4.5 kg) or as told by your health care provider. °· Do not use any products that contain nicotine or tobacco, such as cigarettes and e-cigarettes. If you need help quitting, ask your health care provider. °· Track and write down the number of pads you use each day and how soaked (saturated) they are. °· Do not use tampons. °· Keep all follow-up visits as told by your health care provider. This is important. Your health care provider may ask you to have follow-up blood tests or ultrasound tests or both. °Contact a health care provider if: °· You have any vaginal bleeding. °· You have a fever. °Get help right away if: °· You have severe cramps in your stomach, back, abdomen, or pelvis. °· You pass large clots or tissue. Save any tissue for your health care provider to look at. °· You have more vaginal bleeding, and you faint or become lightheaded or weak. °Summary °· A subchorionic hematoma is a gathering of blood between the outer wall of the placenta and the uterus. °· This condition can cause vaginal bleeding. °· Sometimes you may have no symptoms and the bleeding may only be seen when ultrasound images are taken. °· Treatment may include watchful waiting, medicines, or activity restriction. °This information is not intended to replace advice given to you by your health care provider. Make sure you discuss any questions you   have with your health care provider. °Document Released: 10/13/2006 Document Revised: 06/10/2017 Document Reviewed: 08/24/2016 °Elsevier Patient Education © 2020 [employer] ° °

## 2019-02-27 NOTE — MAU Provider Note (Signed)
Chief Complaint: Vaginal Bleeding and Abdominal Pain   First Provider Initiated Contact with Patient 02/27/19 0346     SUBJECTIVE HPI: [name] is a 33 y.o. [id number] at [redacted]w[redacted]d who presents to Maternity Admissions reporting vaginal bleeding & abdominal cramping. Started this morning. Noticed bright red blood in the toilet and on her legs. No recent intercourse. Has her first OB appointment at [facility] on Wednesday. Has ultrasound in MAU last month confirming IUP.   Location: abdomen Quality: cramping Severity: 4/10 on pain scale Duration: 2 hours Timing: intermittent Modifying factors: none Associated signs and symptoms: vaginal bleeding  Past Medical History:  Diagnosis Date  . Acne   . Anemia   . Chlamydia    OB History  Gravida Para Term Preterm AB Living  6 3 3  0 2 3  SAB TAB Ectopic Multiple Live Births  0 2 0 0 3    # Outcome Date GA Lbr Len/2nd Weight Sex Delivery Anes PTL Lv  6 Current           5 Term 10/26/10    F CS-LTranv   LIV     Birth Comments: repeat c/section  4 Term 05/17/07    F CS-LTranv   LIV     Birth Comments: repeat c/section,  failed VBAC  3 Term 08/12/05    M CS-LTranv   LIV     Birth Comments: fetal distress  2 TAB      TAB     1 TAB            Past Surgical History:  Procedure Laterality Date  . CESAREAN SECTION    . DILATION AND CURETTAGE OF UTERUS     Social History   Socioeconomic History  . Marital status: Married    Spouse name: Not on file  . Number of children: Not on file  . Years of education: Not on file  . Highest education level: Not on file  Occupational History  . Not on file  Social Needs  . Financial resource strain: Not on file  . Food insecurity    Worry: Not on file    Inability: Not on file  . Transportation needs    Medical: Not on file    Non-medical: Not on file  Tobacco Use  . Smoking status: Never Smoker  . Smokeless tobacco: Never Used  Substance and Sexual Activity  . Alcohol use: No  . Drug  use: No  . Sexual activity: Yes  Lifestyle  . Physical activity    Days per week: Not on file    Minutes per session: Not on file  . Stress: Not on file  Relationships  . Social Herbalist on phone: Not on file    Gets together: Not on file    Attends religious service: Not on file    Active member of club or organization: Not on file    Attends meetings of clubs or organizations: Not on file    Relationship status: Not on file  . Intimate partner violence    Fear of current or ex partner: Not on file    Emotionally abused: Not on file    Physically abused: Not on file    Forced sexual activity: Not on file  Other Topics Concern  . Not on file  Social History Narrative  . Not on file   Family History  Adopted: Yes  Problem Relation Age of Onset  . Other Neg Hx  No current facility-administered medications on file prior to encounter.    Current Outpatient Medications on File Prior to Encounter  Medication Sig Dispense Refill  . Prenatal Vit-Fe Fumarate-FA (MULTIVITAMIN-PRENATAL) 27-0.8 MG TABS tablet Take 1 tablet by mouth daily at 12 noon. 30 tablet 11  . cyclobenzaprine (FLEXERIL) 10 MG tablet Take 1 tablet (10 mg total) by mouth 2 (two) times daily as needed for muscle spasms. (Patient not taking: Reported on 02/20/2019) 20 tablet 0   No Known Allergies  I have reviewed patient's Past Medical Hx, Surgical Hx, Family Hx, Social Hx, medications and allergies.   Review of Systems  Constitutional: Negative.   Gastrointestinal: Positive for abdominal pain.  Genitourinary: Positive for vaginal bleeding.    OBJECTIVE Patient Vitals for the past 24 hrs:  BP Temp Pulse Resp Height Weight  02/27/19 0317 128/73 98.4 F (36.9 C) 73 18 5\' 4"  (1.626 m) 104.3 kg   Constitutional: Well-developed, well-nourished female in no acute distress.  Cardiovascular: normal rate & rhythm, no murmur Respiratory: normal rate and effort. Lung sounds clear throughout GI: Abd  soft, non-tender, Pos BS x 4. No guarding or rebound tenderness MS: Extremities nontender, no edema, normal ROM Neurologic: Alert and oriented x 4.  GU:     SPECULUM EXAM: NEFG, thin dark red blood cleared out with 2 fox swabs  BIMANUAL: Cervix closed/thick  LAB RESULTS No results found for this or any previous visit (from the past 24 hour(s)).  IMAGING No results found.  MAU COURSE Orders Placed This Encounter  Procedures  . Urinalysis, Routine w reflex microscopic  . Discharge patient   No orders of the defined types were placed in this encounter.   MDM FHT present via BSUS Pt informed that the ultrasound is considered a limited OB ultrasound and is not intended to be a complete ultrasound exam.  Patient also informed that the ultrasound is not being completed with the intent of assessing for fetal or placental anomalies or any pelvic abnormalities.  Explained that the purpose of today's ultrasound is to assess for  viability.  Patient acknowledges the purpose of the exam and the limitations of the study.  Active IUP with FHR 152 bpm  RH positive  Cervix closed/thick/firm  Discussed possible reasons for bleeding with patient including impending miscarriage or SCH. Recommend pelvic rest & f/u as scheduled with OB on Wednesday. Pt to return for worsening symptoms.    ASSESSMENT 1. Vaginal bleeding in pregnancy, first trimester   2. [redacted] weeks gestation of pregnancy     PLAN Discharge home in stable condition. SAB precautions  Follow-up Information    [facility].   Specialty: Obstetrics and Gynecology Why: return for worsening symptoms Contact information: [address], [address] [id number] [place] [phone]         Allergies as of 02/27/2019   No Known Allergies     Medication List    STOP taking these medications   cyclobenzaprine 10 MG tablet Commonly known as:  FLEXERIL     TAKE these medications   multivitamin-prenatal 27-0.8 MG Tabs tablet Take 1 tablet by mouth daily at 12 noon.        Judeth HornLawrence, Jailon Schaible, NP 02/27/2019  4:13 AM

## 2019-02-27 NOTE — MAU Note (Signed)
Pt stated she woke up and started having abd cramping went to the BR and had bright red bleeding in the toliet

## 2019-02-28 ENCOUNTER — Other Ambulatory Visit (HOSPITAL_COMMUNITY)
Admission: RE | Admit: 2019-02-28 | Discharge: 2019-02-28 | Disposition: A | Discharge status: Home / Self Care | Payer: Medicaid Other | Source: Ambulatory Visit | Attending: Women's Health | Admitting: Women's Health

## 2019-02-28 ENCOUNTER — Ambulatory Visit (INDEPENDENT_AMBULATORY_CARE_PROVIDER_SITE_OTHER): Payer: Medicaid Other | Admitting: Women's Health

## 2019-02-28 VITALS — BP 114/75 | HR 81 | Wt 229.0 lb

## 2019-02-28 DIAGNOSIS — A6009 Herpesviral infection of other urogenital tract: Secondary | ICD-10-CM | POA: Insufficient documentation

## 2019-02-28 DIAGNOSIS — Z3480 Encounter for supervision of other normal pregnancy, unspecified trimester: Secondary | ICD-10-CM | POA: Insufficient documentation

## 2019-02-28 DIAGNOSIS — O99211 Obesity complicating pregnancy, first trimester: Secondary | ICD-10-CM

## 2019-02-28 DIAGNOSIS — E669 Obesity, unspecified: Secondary | ICD-10-CM | POA: Insufficient documentation

## 2019-02-28 DIAGNOSIS — O98319 Other infections with a predominantly sexual mode of transmission complicating pregnancy, unspecified trimester: Secondary | ICD-10-CM | POA: Insufficient documentation

## 2019-02-28 DIAGNOSIS — Z3A11 11 weeks gestation of pregnancy: Secondary | ICD-10-CM

## 2019-02-28 DIAGNOSIS — Z0282 Encounter for adoption services: Secondary | ICD-10-CM

## 2019-02-28 DIAGNOSIS — O98311 Other infections with a predominantly sexual mode of transmission complicating pregnancy, first trimester: Secondary | ICD-10-CM

## 2019-02-28 MED ORDER — BLOOD PRESSURE MONITORING KIT: 1.0000 | PACK | 0 refills | Status: DC

## 2019-02-28 NOTE — Progress Notes (Signed)
History:   [name] is a 33 y.o. [id number] at 61w0dby early [place] seen today for her first obstetrical visit.  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 C/S for fetal distress, then during attempted VBAC failure to progress. Pt also reports hx of surgical TAB x2. Patient does intend to breast feed. Pregnancy history fully reviewed.  Patient reports mild pelvic cramping, 3/10. Pt reports she was seen in MAU yesterday for vaginal bleeding, but denies any bleeding since. Pt is O Positive.  Pt reports PMH of asthma which she does not take medication for or use an inhaler. Pt denies any past hospitalizations for asthma and states that she has never had an asthma attack. Pt also reports hx of HSV with last attack about 3 months ago.  Pt reports C/S are her only history of surgery.  Pt denies any issues with children born in her known biological family or her partner's biological family. Pt reports family history of lupus and diabetes.  Pt is only taking PNVs at this time.     HISTORY: OB History  Gravida Para Term Preterm AB Living  '6 3 3 ' 0 2 3  SAB TAB Ectopic Multiple Live Births  0 2 0 0 3    # Outcome Date GA Lbr Len/2nd Weight Sex Delivery Anes PTL Lv  6 Current           5 Term 10/26/10    F CS-LTranv   LIV     Birth Comments: repeat c/section  4 Term 05/17/07    F CS-LTranv   LIV     Birth Comments: repeat c/section,  failed VBAC  3 Term 08/12/05    M CS-LTranv   LIV     Birth Comments: fetal distress  2 TAB      TAB     1 TAB             Last pap smear was done today, results pending.  Past Medical History:  Diagnosis Date  . Acne   . Anemia   . Chlamydia    Past Surgical History:  Procedure Laterality Date  . CESAREAN SECTION    . DILATION AND CURETTAGE OF UTERUS     Family History  Adopted: Yes  Problem Relation Age of  Onset  . Other Neg Hx    Social History   Tobacco Use  . Smoking status: Never Smoker  . Smokeless tobacco: Never Used  Substance Use Topics  . Alcohol use: No  . Drug use: No   No Known Allergies Current Outpatient Medications on File Prior to Visit  Medication Sig Dispense Refill  . cyclobenzaprine (FLEXERIL) 5 MG tablet Take 10 mg by mouth as needed for muscle spasms.     . Prenatal Vit-Fe Fumarate-FA (MULTIVITAMIN-PRENATAL) 27-0.8 MG TABS tablet Take 1 tablet by mouth daily at 12 noon. 30 tablet 11   No current facility-administered medications on file prior to visit.     Review of Systems Pertinent items noted in HPI and remainder of comprehensive ROS otherwise negative. Physical Exam:   Vitals:   02/28/19 1315  BP: 114/75  Pulse: 81  Weight: 229 lb (103.9 kg)   Fetal Heart Rate (bpm): 160 Uterus:   gravid, 11wks  Pelvic Exam: Perineum: no hemorrhoids, normal perineum   Vulva: normal external genitalia, no lesions  Vagina:  normal mucosa, normal discharge, minimal brown discharge present.   Cervix: no lesions and normal, pap smear done. No bleeding noted on exam, but cervix friable.   Adnexa: normal adnexa and no mass, fullness, tenderness   Bony Pelvis: average  System: General: well-developed, well-nourished female in no acute distress   Breasts:  normal appearance, no masses or tenderness bilaterally   Skin: normal coloration and turgor, no rashes   Neurologic: oriented, normal, negative, normal mood   Extremities: normal strength, tone, and muscle mass, ROM of all joints is normal   HEENT PERRLA, extraocular movement intact and sclera clear, anicteric   Mouth/Teeth mucous membranes moist, pharynx normal without lesions and dental hygiene good   Neck supple and no masses   Cardiovascular: regular rate and rhythm   Respiratory:  no respiratory distress, normal breath sounds   Abdomen: soft, non-tender; bowel sounds normal; no masses,  no organomegaly      Assessment:    Pregnancy: [id number] Patient Active Problem List   Diagnosis Date Noted  . Adopted 02/28/2019  . Obesity (BMI 30-39.9) 02/28/2019  . Genital herpes affecting pregnancy 02/28/2019  . H/O cesarean section 02/20/2019  . Supervision of other normal pregnancy, antepartum 02/20/2019  . GBS bacteriuria 01/28/2019  . ASTHMA 04/12/2007     Plan:    1. Supervision of other normal pregnancy, antepartum  - Cytology - PAP( [place]) - Cervicovaginal ancillary only( [place]) - Obstetric Panel, Including HIV - Culture, OB Urine - Babyscripts Schedule Optimization - Blood Pressure Monitoring KIT; 1 kit by Does not apply route once a week.  Dispense: 1 kit; Refill: 0 - Genetic Screening - Enroll patient in Babyscripts Program - [place] MFM OB COMP + 14 WK; Future - pt needs repeat C/S  2. Adopted -family history mostly unknown  3. Obesity (BMI 30-39.9)  4. Genital herpes affecting pregnancy in first trimester - will discuss Valtrex treatment later in pregnancy, likely unneeded and patient will have repeat C/S.   Initial labs drawn. Continue prenatal vitamins. Genetic Screening discussed, NIPS: requested. Ultrasound discussed; fetal anatomic survey: requested. Problem list reviewed and updated. The nature of [facility] with multiple MDs and other Advanced Practice Providers was explained to patient; also emphasized that residents, students are part of our team. Routine obstetric precautions reviewed. Return in about 7 weeks (around 04/18/2019) for virtual visit, needs anatomy scan scheduled between 18-20 weeks.   Clarisa Fling, NP  2:09 PM 02/28/2019

## 2019-02-28 NOTE — Patient Instructions (Signed)
Pregnancy and Genital Herpes  Genital herpes is an STI (sexually transmitted infection) that is caused by the herpes simplex virus (HSV). HSV can cause an outbreak of itching, blisters, and sores (ulcers) around the genitals and rectum. Even when the outbreak goes away, the virus stays in the body. If you are pregnant, you can pass HSV to your baby. If you become infected with HSV for the first time while you are pregnant, the virus can cause serious problems for your baby. If you had HSV before your pregnancy, the virus may not affect your baby as seriously. Babies that are infected with HSV are at risk for developing inflammation of the brain (encephalitis), damage to organs, and problems with development. How does this affect me? Your type of delivery may be affected. You may be able to have a vaginal delivery if you have no evidence of an outbreak when you go into labor. However, your baby may need to be delivered by C-section (cesarean delivery) if you have:  An active, recurrent, or new herpes outbreak at the time of delivery. This is because the virus can pass to your baby through an infected birth canal. This can cause severe problems for your baby.  Any symptoms of infection in the areas around the genitals such as pain, burning, and itching, even if you do not have any ulcers in the birth canal. After delivery, you can breastfeed your baby. The virus will not be present in breast milk. While caring for your baby, you will need to take steps to avoid passing the virus on to your baby. How does this affect my baby? If the virus passes to your baby, it can cause serious problems. The virus can be passed to your baby:  Before delivery. The virus can be passed to your unborn baby through the placenta. This is more likely to happen if you get herpes for the first time in the first 3 months of pregnancy (first trimester). This may cause your baby to have a congenital disability.  During delivery.  This is more likely to happen if you become infected for the first time late in your pregnancy.  After delivery. Your baby can get a herpes infection if you touch active ulcers and then touch your baby without washing your hands. The virus is less likely to pass to your baby if you had herpes before you became pregnant. This is because antibodies against the virus develop over a period of time. These antibodies help to protect the baby. How is this treated? This condition can be treated with medicines during pregnancy that are safe for you and your baby. These medicines can help to reduce symptoms, shorten an outbreak, and prevent another outbreak of the infection. If the infection happened before you became pregnant, you may need to take medicine late in your pregnancy to help to prevent a breakout at the time of delivery. Follow these instructions at home: To avoid passing the virus to your baby:  Wash your hands with soap and water often and before touching your baby.  If you have an outbreak, keep the area clean and covered.  If ulcers are present on your breast, do not breastfeed from the affected breast. Contact a health care provider if:  You have a rash, blisters, or ulcers in the area around your genitals or rectum.  You have burning, itching, or pain in the area around your genitals or rectum.  You have trouble urinating. Summary  Genital herpes is an  STI (sexually transmitted infection) that is caused by the herpes simplex virus (HSV). If you are pregnant, you can pass the virus to your baby.  Even when the outbreak goes away, the virus stays in your body.  Genital herpes can be passed to your unborn or newborn baby and cause serious problems.  This condition can be treated with medicines during pregnancy that are safe for you and your baby. Medicines can treat your symptoms, shorten the length of an outbreak, and prevent another outbreak of the infection.  If you have signs  or symptoms of a herpes outbreak when you go into labor, your health care provider may recommend a C-section (cesarean delivery) to lower the risk of passing the virus to your baby. This information is not intended to replace advice given to you by your health care provider. Make sure you discuss any questions you have with your health care provider. Document Released: 10/04/2000 Document Revised: 10/20/2018 Document Reviewed: 09/28/2018 Elsevier Patient Education  2020 Elsevier Inc.  Vaginal Bleeding During Pregnancy, First Trimester  A small amount of bleeding from the vagina (spotting) is relatively common during early pregnancy. It usually stops on its own. Various things may cause bleeding or spotting during early pregnancy. Some bleeding may be related to the pregnancy, and some may not. In many cases, the bleeding is normal and is not a problem. However, bleeding can also be a sign of something serious. Be sure to tell your health care provider about any vaginal bleeding right away. Some possible causes of vaginal bleeding during the first trimester include:  Infection or inflammation of the cervix.  Growths (polyps) on the cervix.  Miscarriage or threatened miscarriage.  Pregnancy tissue developing outside of the uterus (ectopic pregnancy).  A mass of tissue developing in the uterus due to an egg being fertilized incorrectly (molar pregnancy). Follow these instructions at home: Activity  Follow instructions from your health care provider about limiting your activity. Ask what activities are safe for you.  If needed, make plans for someone to help with your regular activities.  Do not have sex or orgasms until your health care provider says that this is safe. General instructions  Take over-the-counter and prescription medicines only as told by your health care provider.  Pay attention to any changes in your symptoms.  Do not use tampons or douche.  Write down how many pads  you use each day, how often you change pads, and how soaked (saturated) they are.  If you pass any tissue from your vagina, save the tissue so you can show it to your health care provider.  Keep all follow-up visits as told by your health care provider. This is important. Contact a health care provider if:  You have vaginal bleeding during any part of your pregnancy.  You have cramps or labor pains.  You have a fever. Get help right away if:  You have severe cramps in your back or abdomen.  You pass large clots or a large amount of tissue from your vagina.  Your bleeding increases.  You feel light-headed or weak, or you faint.  You have chills.  You are leaking fluid or have a gush of fluid from your vagina. Summary  A small amount of bleeding (spotting) from the vagina is relatively common during early pregnancy.  Various things may cause bleeding or spotting in early pregnancy.  Be sure to tell your health care provider about any vaginal bleeding right away. This information is not intended  to replace advice given to you by your health care provider. Make sure you discuss any questions you have with your health care provider. Document Released: 04/07/2005 Document Revised: 10/17/2018 Document Reviewed: 09/30/2016 Elsevier Patient Education  2020 [employer].   Warning Signs During Pregnancy A pregnancy lasts about 40 weeks, starting from the first day of your last period until the baby is born. Pregnancy is divided into three phases called trimesters.  The first trimester refers to week 1 through week 13 of pregnancy.  The second trimester is the start of week 14 through the end of week 27.  The third trimester is the start of week 28 until you deliver your baby. During each trimester of pregnancy, certain signs and symptoms may indicate a problem. Talk with your health care provider about your current health and any medical conditions you have. Make sure you know the  symptoms that you should watch for and report. How does this affect me?  Warning signs in the first trimester While some changes during the first trimester may be uncomfortable, most do not represent a serious problem. Let your health care provider know if you have any of the following warning signs in the first trimester:  You cannot eat or drink without vomiting, and this lasts for longer than a day.  You have vaginal bleeding or spotting along with menstrual-like cramping.  You have diarrhea for longer than a day.  You have a fever or other signs of infection, such as: ? Pain or burning when you urinate. ? Foul smelling or thick or yellowish vaginal discharge. Warning signs in the second trimester As your baby grows and changes during the second trimester, there are additional signs and symptoms that may indicate a problem. These include:  Signs and symptoms of infection, including a fever.  Signs or symptoms of a miscarriage or preterm labor, such as regular contractions, menstrual-like cramping, or lower abdominal pain.  Bloody or watery vaginal discharge or obvious vaginal bleeding.  Feeling like your heart is pounding.  Having trouble breathing.  Nausea, vomiting, or diarrhea that lasts for longer than a day.  Craving non-food items, such as clay, chalk, or dirt. This may be a sign of a very treatable medical condition called pica. Later in your second trimester, watch for signs and symptoms of a serious medical condition called preeclampsia.These include:  Changes in your vision.  A severe headache that does not go away.  Nausea and vomiting. It is also important to notice if your baby stops moving or moves less than usual during this time. Warning signs in the third trimester As you approach the third trimester, your baby is growing and your body is preparing for the birth of your baby. In your third trimester, be sure to let your health care provider know if:  You  have signs and symptoms of infection, including a fever.  You have vaginal bleeding.  You notice that your baby is moving less than usual or is not moving.  You have nausea, vomiting, or diarrhea that lasts for longer than a day.  You have a severe headache that does not go away.  You have vision changes, including seeing spots or having blurry or double vision.  You have increased swelling in your hands or face. How does this affect my baby? Throughout your pregnancy, always report any of the warning signs of a problem to your health care provider. This can help prevent complications that may affect your baby, including:  Increased risk for premature birth.  Infection that may be transmitted to your baby.  Increased risk for stillbirth. Contact a health care provider if:  You have any of the warning signs of a problem for the current trimester of your pregnancy.  Any of the following apply to you during any trimester of pregnancy: ? You have strong emotions, such as sadness or anxiety, that interfere with work or personal relationships. ? You feel unsafe in your home and need help finding a safe place to live. ? You are using tobacco products, alcohol, or drugs and you need help to stop. Get help right away if: You have signs or symptoms of labor before 37 weeks of pregnancy. These include:  Contractions that are 5 minutes or less apart, or that increase in frequency, intensity, or length.  Sudden, sharp abdominal pain or low back pain.  Uncontrolled gush or trickle of fluid from your vagina. Summary  A pregnancy lasts about 40 weeks, starting from the first day of your last period until the baby is born. Pregnancy is divided into three phases called trimesters. Each trimester has warning signs to watch for.  Always report any warning signs to your health care provider in order to prevent complications that may affect both you and your baby.  Talk with your health care  provider about your current health and any medical conditions you have. Make sure you know the symptoms that you should watch for and report. This information is not intended to replace advice given to you by your health care provider. Make sure you discuss any questions you have with your health care provider. Document Released: 04/14/2017 Document Revised: 10/17/2018 Document Reviewed: 04/14/2017 Elsevier Patient Education  2020 [employer]

## 2019-02-28 NOTE — Progress Notes (Signed)
NOB in office today, states that she has not had anymore bleeding since MAU visit, reports cramps at a 3 out of 10.

## 2019-03-01 LAB — OBSTETRIC PANEL, INCLUDING HIV
Antibody Screen: NEGATIVE
Basophils Absolute: 0 10*3/uL (ref 0.0–0.2)
Basos: 0 %
EOS (ABSOLUTE): 0.1 10*3/uL (ref 0.0–0.4)
Eos: 1 %
HIV Screen 4th Generation wRfx: NONREACTIVE
Hematocrit: 32.6 % — ABNORMAL LOW (ref 34.0–46.6)
Hemoglobin: 10.3 g/dL — ABNORMAL LOW (ref 11.1–15.9)
Hepatitis B Surface Ag: NEGATIVE
Immature Grans (Abs): 0 10*3/uL (ref 0.0–0.1)
Immature Granulocytes: 0 %
Lymphocytes Absolute: 1.5 10*3/uL (ref 0.7–3.1)
Lymphs: 20 %
MCH: 26.5 pg — ABNORMAL LOW (ref 26.6–33.0)
MCHC: 31.6 g/dL (ref 31.5–35.7)
MCV: 84 fL (ref 79–97)
Monocytes Absolute: 0.5 10*3/uL (ref 0.1–0.9)
Monocytes: 6 %
Neutrophils Absolute: 5.5 10*3/uL (ref 1.4–7.0)
Neutrophils: 73 %
Platelets: 281 10*3/uL (ref 150–450)
RBC: 3.88 x10E6/uL (ref 3.77–5.28)
RDW: 14.6 % (ref 11.7–15.4)
RPR Ser Ql: NONREACTIVE
Rh Factor: POSITIVE
Rubella Antibodies, IGG: 2.86 index (ref >0.99)
WBC: 7.6 10*3/uL (ref 3.4–10.8)

## 2019-03-02 ENCOUNTER — Other Ambulatory Visit: Payer: Self-pay | Admitting: Women's Health

## 2019-03-02 ENCOUNTER — Encounter: Payer: Self-pay | Admitting: Women's Health

## 2019-03-02 DIAGNOSIS — O99011 Anemia complicating pregnancy, first trimester: Secondary | ICD-10-CM

## 2019-03-02 DIAGNOSIS — O99019 Anemia complicating pregnancy, unspecified trimester: Secondary | ICD-10-CM | POA: Insufficient documentation

## 2019-03-02 LAB — CYTOLOGY - PAP
Diagnosis: NEGATIVE
HPV: NOT DETECTED

## 2019-03-02 LAB — CERVICOVAGINAL ANCILLARY ONLY
Bacterial vaginitis: NEGATIVE
Candida vaginitis: NEGATIVE
Chlamydia: NEGATIVE
Neisseria Gonorrhea: NEGATIVE
Trichomonas: NEGATIVE

## 2019-03-02 MED ORDER — FERROUS SULFATE 325 (65 FE) MG PO TABS: 325.0000 mg | ORAL_TABLET | Freq: Every day | ORAL | 1 refills | Status: DC

## 2019-03-02 NOTE — Progress Notes (Signed)
Pt has hgb 0.3, sending ferrous sulfate per guidelines. MyChart message sent to patient regarding results.  Clarisa Fling, NP  11:03 AM 03/02/2019

## 2019-03-03 LAB — CULTURE, OB URINE

## 2019-03-03 LAB — URINE CULTURE, OB REFLEX

## 2019-03-06 ENCOUNTER — Telehealth: Payer: Self-pay | Admitting: *Deleted

## 2019-03-06 ENCOUNTER — Other Ambulatory Visit: Payer: Self-pay

## 2019-03-06 ENCOUNTER — Inpatient Hospital Stay (HOSPITAL_COMMUNITY)
Admission: AD | Admit: 2019-03-06 | Discharge: 2019-03-06 | Disposition: A | Discharge status: Home / Self Care | Payer: Medicaid Other | Attending: Obstetrics and Gynecology | Admitting: Obstetrics and Gynecology

## 2019-03-06 DIAGNOSIS — O98311 Other infections with a predominantly sexual mode of transmission complicating pregnancy, first trimester: Secondary | ICD-10-CM | POA: Diagnosis not present

## 2019-03-06 DIAGNOSIS — E669 Obesity, unspecified: Secondary | ICD-10-CM

## 2019-03-06 DIAGNOSIS — O209 Hemorrhage in early pregnancy, unspecified: Secondary | ICD-10-CM

## 2019-03-06 DIAGNOSIS — O34219 Maternal care for unspecified type scar from previous cesarean delivery: Secondary | ICD-10-CM | POA: Insufficient documentation

## 2019-03-06 DIAGNOSIS — R8271 Bacteriuria: Secondary | ICD-10-CM | POA: Insufficient documentation

## 2019-03-06 DIAGNOSIS — O99211 Obesity complicating pregnancy, first trimester: Secondary | ICD-10-CM | POA: Diagnosis not present

## 2019-03-06 DIAGNOSIS — Z0282 Encounter for adoption services: Secondary | ICD-10-CM

## 2019-03-06 DIAGNOSIS — A6009 Herpesviral infection of other urogenital tract: Secondary | ICD-10-CM | POA: Insufficient documentation

## 2019-03-06 DIAGNOSIS — O26891 Other specified pregnancy related conditions, first trimester: Secondary | ICD-10-CM | POA: Diagnosis not present

## 2019-03-06 DIAGNOSIS — D649 Anemia, unspecified: Secondary | ICD-10-CM | POA: Insufficient documentation

## 2019-03-06 DIAGNOSIS — N939 Abnormal uterine and vaginal bleeding, unspecified: Secondary | ICD-10-CM | POA: Diagnosis present

## 2019-03-06 DIAGNOSIS — O99011 Anemia complicating pregnancy, first trimester: Secondary | ICD-10-CM | POA: Insufficient documentation

## 2019-03-06 DIAGNOSIS — Z3A11 11 weeks gestation of pregnancy: Secondary | ICD-10-CM | POA: Diagnosis not present

## 2019-03-06 DIAGNOSIS — O99019 Anemia complicating pregnancy, unspecified trimester: Secondary | ICD-10-CM

## 2019-03-06 NOTE — Telephone Encounter (Signed)
Pt called to office stating she needed to schedule an appt after talking with after hours nurse.  Pt states she is bleeding again and would like to know what to do.   Pt was made aware it is recommended that she be seen at the hospital in order to properly be evaluated.  Pt states she would like to know if she would be given an ultrasound.  I advised pt that she may have an u/s due to bleeding to evaluate pregnancy.  Pt seems hesitant to be seen at hospital but again made aware of recommendation.  Pt states understanding.

## 2019-03-06 NOTE — MAU Note (Signed)
Pt presents to MAU for vaginal bleeding and menstrual cramping. She reports that she only sees blood when she wipes. She was seen on 02/27/19 for same issues. Had follow up visit w/provider on 8/19. Was told everything was okay and to watch for further bleeding. She had seen no bleeding until yesterday evening when she noticed bright red blood when wiping.  She called her provider and was told to come to MAU for evaluation.

## 2019-03-06 NOTE — MAU Provider Note (Signed)
Chief Complaint: Vaginal Bleeding   First Provider Initiated Contact with Patient 03/06/19 [phone]      SUBJECTIVE HPI: [name] is a 33 y.o. [id number] at [id number] LMP, early ultrasound who presents to maternity admissions reporting vaginal bleeding. Last night she began to have some cramping and blood on toilet paper when wiping. She has been wearing a pad but reports she has not noted any blood on her pad. This is the third time this has happened in this pregnancy and she is concerned. Denies any other concerns.  She denies leaking of fluid, vaginal discharge, abdominal pain, diarrhea, constipation, concern for STD's, vaginal itching/burning, urinary symptoms, h/a, dizziness, n/v, or fever/chills.    Past Medical History:  Diagnosis Date  . Acne   . Anemia   . Chlamydia    Past Surgical History:  Procedure Laterality Date  . CESAREAN SECTION    . DILATION AND CURETTAGE OF UTERUS     Social History   Socioeconomic History  . Marital status: Married    Spouse name: Not on file  . Number of children: Not on file  . Years of education: Not on file  . Highest education level: Not on file  Occupational History  . Not on file  Social Needs  . Financial resource strain: Not on file  . Food insecurity    Worry: Not on file    Inability: Not on file  . Transportation needs    Medical: Not on file    Non-medical: Not on file  Tobacco Use  . Smoking status: Never Smoker  . Smokeless tobacco: Never Used  Substance and Sexual Activity  . Alcohol use: No  . Drug use: No  . Sexual activity: Yes  Lifestyle  . Physical activity    Days per week: Not on file    Minutes per session: Not on file  . Stress: Not on file  Relationships  . Social [place] phone: Not on file    Gets together: Not on file    Attends religious service: Not on file    Active member of club or organization: Not on file    Attends meetings of clubs or organizations: Not on file     Relationship status: Not on file  . Intimate partner violence    Fear of current or ex partner: Not on file    Emotionally abused: Not on file    Physically abused: Not on file    Forced sexual activity: Not on file  Other Topics Concern  . Not on file  Social History Narrative  . Not on file   No current facility-administered medications on file prior to encounter.    Current Outpatient Medications on File Prior to Encounter  Medication Sig Dispense Refill  . acetaminophen (TYLENOL) 500 MG tablet Take 1,000 mg by mouth every 6 (six) hours as needed for moderate pain.    . cyclobenzaprine (FLEXERIL) 5 MG tablet Take 10 mg by mouth as needed for muscle spasms.     . ferrous sulfate 325 (65 FE) MG tablet Take 1 tablet (325 mg total) by mouth daily. 100 tablet 1  . Prenatal Vit-Fe Fumarate-FA (MULTIVITAMIN-PRENATAL) 27-0.8 MG TABS tablet Take 1 tablet by mouth daily at 12 noon. 30 tablet 11  . Blood Pressure Monitoring KIT 1 kit by Does not apply route once a week. 1 kit 0   No Known Allergies  ROS:  Review of Systems All other systems negative unless  noted above in HPI.   I have reviewed patient's Past Medical Hx, Surgical Hx, Family Hx, Social Hx, medications and allergies.   Physical Exam   Patient Vitals for the past 24 hrs:  BP Temp Temp src Pulse Resp SpO2 Height Weight  03/06/19 1120 (!) 110/44 - - - - - - -  03/06/19 0945 124/75 - - 88 - - - -  03/06/19 0911 112/68 98.6 F (37 C) Oral 92 18 100 % '5\' 4"'  (1.626 m) 103.2 kg   Constitutional: Well-developed, well-nourished female in no acute distress.  Cardiovascular: normal rate Respiratory: normal effort GI: Abd soft, non-tender MS: Extremities nontender, no edema, normal ROM Neurologic: Alert and oriented x 4.  GU: Neg CVAT. PELVIC EXAM: Cervix pink, visually closed, without lesion, minimal dark brown clots/blood noted in vaginal vault, vaginal walls and external genitalia normal  FHT 140's by doppler  LAB  RESULTS No results found for this or any previous visit (from the past 24 hour(s)).  O/Positive/-- (08/19 1334)  IMAGING US Ob Transvaginal  Result Date: 02/05/2019 CLINICAL DATA:  First trimester pregnancy with inconclusive fetal viability. EXAM: TRANSVAGINAL OB ULTRASOUND TECHNIQUE: Transvaginal ultrasound was performed for complete evaluation of the gestation as well as the maternal uterus, adnexal regions, and pelvic cul-de-sac. COMPARISON:  01/20/2019 FINDINGS: Intrauterine gestational sac: Single Yolk sac:  Visualized. Embryo:  Visualized. Cardiac Activity: Visualized. Heart Rate: 164 bpm CRL:   14 mm   7 w 5 d                  [place] EDC: 09/19/2019 Subchorionic hemorrhage:  None visualized. Maternal uterus/adnexae: Normal appearance of both ovaries. No mass or abnormal free fluid identified. IMPRESSION: Single living IUP measuring 7 weeks 5 days, with [place] EDC of 09/19/2019. No significant maternal uterine or adnexal abnormality identified. Electronically Signed   By: Marlaine Hind M.D.   On: 02/05/2019 15:57    MAU Management/MDM: Orders Placed This Encounter  Procedures  . Urinalysis, Routine w reflex microscopic  . Discharge patient Discharge disposition: 01-Home or Self Care; Discharge patient date: 03/06/2019    No orders of the defined types were placed in this encounter.   Patient presented with vaginal bleeding and mild cramping. Previous US showing IUP and without subchorionic hemorrhage. Patient declined STD testing. No other concerns. FHT 140's by doppler. BSUS performed showing fetal movement. Patient informed that the ultrasound is considered a limited OB ultrasound and is not intended to be a complete ultrasound exam.  Patient also informed that the ultrasound is not being completed with the intent of assessing for fetal or placental anomalies or any pelvic abnormalities.  Explained that the purpose of today's ultrasound is to assess for  viability.  Patient acknowledges the purpose  of the exam and the limitations of the study. Should she have heavy bleeding or other concerns, she will return.  Pt discharged with strict return precautions.  ASSESSMENT 1. Vaginal bleeding in pregnancy, first trimester   2. Anemia affecting pregnancy, antepartum   3. Adopted   4. Obesity (BMI 30-39.9)   5. 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 GBS bacteriuria     PLAN Discharge home Allergies as of 03/06/2019   No Known Allergies     Medication List    TAKE these medications   acetaminophen 500 MG tablet Commonly known as: TYLENOL Take 1,000 mg by mouth every 6 (six) hours as needed for moderate pain.   Blood Pressure Monitoring Kit 1  kit by Does not apply route once a week.   cyclobenzaprine 5 MG tablet Commonly known as: FLEXERIL Take 10 mg by mouth as needed for muscle spasms.   ferrous sulfate 325 (65 FE) MG tablet Take 1 tablet (325 mg total) by mouth daily.   multivitamin-prenatal 27-0.8 MG Tabs tablet Take 1 tablet by mouth daily at 12 noon.      Follow-up Information    [facility]. Go to.   Specialty: Obstetrics and Gynecology Why: Appointment as scheduled unless further concerns. Contact information: [address], [place], [place], [facility], [place] [facility] 03/06/2019  11:32 AM

## 2019-03-08 ENCOUNTER — Other Ambulatory Visit: Payer: Self-pay

## 2019-03-08 ENCOUNTER — Telehealth: Payer: Self-pay | Admitting: *Deleted

## 2019-03-08 ENCOUNTER — Other Ambulatory Visit: Payer: Medicaid Other

## 2019-03-08 DIAGNOSIS — R300 Dysuria: Secondary | ICD-10-CM

## 2019-03-08 DIAGNOSIS — O26899 Other specified pregnancy related conditions, unspecified trimester: Secondary | ICD-10-CM

## 2019-03-08 NOTE — Telephone Encounter (Signed)
Pt called to office stating she thinks she may have a UTI.  Pt states she feels her bladder is full but does not release much urine at a time.  Pt was made aware to come by office and leave a urine sample to be sent for culture.   Pt also has concerns for bleeding she has been having throughout pregnancy.  Pt has been seen multiple times at hospital for this problem.  Pt states she was told she may have a [facility] even though not noted on last u/s.  Pt was just seen at hospital this week for continued bleeding- no formal scan done.  Pt staes she was told "they don't know where bleeding is coming from". Pt is quite concerned and feels that she is not being taken seriously.  Pt states she would like to know how/why she is continuing to bleed.  Pt states bleeding is not like a cycle but sees it when she wipes.  Pt states bleeding may be light to heavy at times. Pt states bright red to brown.  Pt states she is having some cramping but not severe.   Pt made aware that message will be sent to provider for review and recommendations. Pt wants to know if she could get another u/s before scheduled one in Oct.    Please advise.

## 2019-03-09 ENCOUNTER — Telehealth: Payer: Self-pay | Admitting: *Deleted

## 2019-03-09 NOTE — Telephone Encounter (Signed)
Pt called to office for urine results. Pt made aware it does take a few days to result.  Pt advised we need to have results as culture last week was negative and to know what to treat with.  Pt advised to call back on Monday to see if results are in.

## 2019-03-10 LAB — URINE CULTURE, OB REFLEX

## 2019-03-10 LAB — CULTURE, OB URINE

## 2019-03-12 ENCOUNTER — Telehealth: Payer: Self-pay

## 2019-03-12 ENCOUNTER — Encounter: Payer: Self-pay | Admitting: Obstetrics and Gynecology

## 2019-03-12 NOTE — Telephone Encounter (Signed)
Returned call and pt wanted urine results, advised of results. Pt upset and stated that she has continued to have intermediate bleeding during this pregnancy and no one is able to tell her why.Pt states that bleeding is when she wipes after urinating and will go from pink, to bright red, to dark brown. Discussed with previous nurse based on notes, and chart was routed to a provider for review, advised pt will follow up with more information.

## 2019-03-13 NOTE — Telephone Encounter (Signed)
I have reviewed the patient's chart. The recent ultrasounds do not indicate that there is [facility] although there could be one that is too small to measure that would explain her bleeding. Anytime there is bleeding in the uterus there will be some cramping from irritation/inflammation. Unfortunately, there is nothing that we can do to stop the bleeding but most patients will stop bleeding around the end of the first trimester. They didn't do a formal US last time she was in MAU, but did do a bedside to show her the baby was still moving normally and got FHTs which were normal. Although I understand it is very frustrating and scary to continue to have bleeding in pregnancy, in the first trimester the only thing we can recommend is pelvic rest, avoid heavy lifting or strenuous activity and keep scheduled appointments. She should also be encouraged to call the office or go to MAU for any increase in bleeding or pain for re-evaluation with the understanding that this would just be a visit to listen for FHR and assess cervix since she is < 23 weeks. She doesn't have a scheduled visit for another month, but she could come in for doppler FHTs for reassurance if she would like before then.

## 2019-03-13 NOTE — Telephone Encounter (Signed)
CNM's response given verbatim to patient.  Pt agreed and voiced understanding without further questions. Pt agrees to schedule doppler FHT visit for reassurance.

## 2019-03-14 ENCOUNTER — Ambulatory Visit (INDEPENDENT_AMBULATORY_CARE_PROVIDER_SITE_OTHER): Payer: Medicaid Other

## 2019-03-14 ENCOUNTER — Other Ambulatory Visit: Payer: Self-pay

## 2019-03-14 DIAGNOSIS — Z3A13 13 weeks gestation of pregnancy: Secondary | ICD-10-CM

## 2019-03-14 DIAGNOSIS — Z3492 Encounter for supervision of normal pregnancy, unspecified, second trimester: Secondary | ICD-10-CM

## 2019-03-14 DIAGNOSIS — Z3490 Encounter for supervision of normal pregnancy, unspecified, unspecified trimester: Secondary | ICD-10-CM

## 2019-03-14 NOTE — Progress Notes (Signed)
Patient presented to office today for her fetal heart tones. She reports having some bleeding and went to [facility] unit and they told her she could come here for fetal heart tones. Patient reports no other problems at this time.  FHT 142 today.

## 2019-03-15 ENCOUNTER — Other Ambulatory Visit: Payer: Self-pay

## 2019-03-15 ENCOUNTER — Encounter (HOSPITAL_COMMUNITY): Payer: Self-pay | Admitting: *Deleted

## 2019-03-15 ENCOUNTER — Inpatient Hospital Stay (HOSPITAL_COMMUNITY)
Admission: AD | Admit: 2019-03-15 | Discharge: 2019-03-15 | Disposition: A | Discharge status: Home / Self Care | Payer: Medicaid Other | Attending: Family Medicine | Admitting: Family Medicine

## 2019-03-15 ENCOUNTER — Inpatient Hospital Stay (HOSPITAL_COMMUNITY): Payer: Medicaid Other

## 2019-03-15 DIAGNOSIS — O26891 Other specified pregnancy related conditions, first trimester: Secondary | ICD-10-CM | POA: Insufficient documentation

## 2019-03-15 DIAGNOSIS — Z6790 Unspecified blood type, Rh positive: Secondary | ICD-10-CM | POA: Diagnosis not present

## 2019-03-15 DIAGNOSIS — Z3A13 13 weeks gestation of pregnancy: Secondary | ICD-10-CM

## 2019-03-15 DIAGNOSIS — R102 Pelvic and perineal pain: Secondary | ICD-10-CM | POA: Diagnosis not present

## 2019-03-15 DIAGNOSIS — O418X10 Other specified disorders of amniotic fluid and membranes, first trimester, not applicable or unspecified: Secondary | ICD-10-CM | POA: Diagnosis not present

## 2019-03-15 DIAGNOSIS — O208 Other hemorrhage in early pregnancy: Secondary | ICD-10-CM | POA: Diagnosis not present

## 2019-03-15 DIAGNOSIS — O209 Hemorrhage in early pregnancy, unspecified: Secondary | ICD-10-CM | POA: Diagnosis not present

## 2019-03-15 DIAGNOSIS — O468X1 Other antepartum hemorrhage, first trimester: Secondary | ICD-10-CM

## 2019-03-15 DIAGNOSIS — O4691 Antepartum hemorrhage, unspecified, first trimester: Secondary | ICD-10-CM | POA: Diagnosis not present

## 2019-03-15 DIAGNOSIS — O26899 Other specified pregnancy related conditions, unspecified trimester: Secondary | ICD-10-CM

## 2019-03-15 DIAGNOSIS — O4690 Antepartum hemorrhage, unspecified, unspecified trimester: Secondary | ICD-10-CM

## 2019-03-15 HISTORY — DX: Unspecified infectious disease: B99.9

## 2019-03-15 LAB — WET PREP, GENITAL
Clue Cells Wet Prep HPF POC: NONE SEEN
Sperm: NONE SEEN
Trich, Wet Prep: NONE SEEN
Yeast Wet Prep HPF POC: NONE SEEN

## 2019-03-15 LAB — URINALYSIS, ROUTINE W REFLEX MICROSCOPIC
Bilirubin Urine: NEGATIVE
Glucose, UA: NEGATIVE mg/dL
Ketones, ur: NEGATIVE mg/dL
Leukocytes,Ua: NEGATIVE
Nitrite: NEGATIVE
Protein, ur: NEGATIVE mg/dL
Specific Gravity, Urine: 1.018 (ref 1.005–1.030)
pH: 6 (ref 5.0–8.0)

## 2019-03-15 LAB — CBC
HCT: 33 % — ABNORMAL LOW (ref 36.0–46.0)
Hemoglobin: 10.9 g/dL — ABNORMAL LOW (ref 12.0–15.0)
MCH: 28.5 pg (ref 26.0–34.0)
MCHC: 33 g/dL (ref 30.0–36.0)
MCV: 86.2 fL (ref 80.0–100.0)
Platelets: 285 10*3/uL (ref 150–400)
RBC: 3.83 MIL/uL — ABNORMAL LOW (ref 3.87–5.11)
RDW: 15.6 % — ABNORMAL HIGH (ref 11.5–15.5)
WBC: 7.1 10*3/uL (ref 4.0–10.5)
nRBC: 0 % (ref 0.0–0.2)

## 2019-03-15 NOTE — MAU Note (Signed)
Ongoing problem with bleeding in preg, is off and on. Today, is a dk red when she wipes. No clots. Cramping.

## 2019-03-15 NOTE — MAU Note (Signed)
Vag bleeding with cramping. Was here with the bleeding on Aug 25. Want an U/S to find out why I am still bleeding. If the baby is fine why am I still bleeding. Have picture of bleeding on cell phone. Feels mentally drained because don't understand why she is still bleeding. Have had 3 C/S. childeren 58 yr. 72yr.,and 33 yr old and 2 abortions.

## 2019-03-15 NOTE — MAU Provider Note (Signed)
History     CSN: [id number]  Arrival date and time: 03/15/19 9629   First Provider Initiated Contact with Patient 03/15/19 1048      Chief Complaint  Patient presents with  . Vaginal Bleeding  . Abdominal Pain   [name] is a 33 y.o. [phone] at 27w1dwho presents to MAU for vaginal bleeding. Of note, pt has been seen in MAU 4 times prior to today's visit for the same concern and has called the clinic multiple times regarding the bleeding. Pt reports being extremely frustrated that she cannot get a clear answer about why she is bleeding and reprts that this has "not been a happy pregnancy" for her because of the extreme stress she is feeling related to the bleeding. Pt also reports she has been experiencing cramping that is occasional and rates the pain as 4/10 - 7/10, but believes this is normal because she has had this in other pregnancies as well, but pt has not experienced this bleeding with other pregnancies. Pt denies other s/sx outside of bleeding and cramping. FHT obtained in triage: 146.   OB History    Gravida  6   Para  3   Term  3   Preterm  0   AB  2   Living  3     SAB  0   TAB  2   Ectopic  0   Multiple  0   Live Births  3           Past Medical History:  Diagnosis Date  . Acne   . Anemia   . Chlamydia   . Infection    UTI    Past Surgical History:  Procedure Laterality Date  . CESAREAN SECTION    . DILATION AND CURETTAGE OF UTERUS      Family History  Adopted: Yes  Problem Relation Age of Onset  . Other Neg Hx     Social History   Tobacco Use  . Smoking status: Never Smoker  . Smokeless tobacco: Never Used  Substance Use Topics  . Alcohol use: No  . Drug use: No    Allergies: No Known Allergies  Medications Prior to Admission  Medication Sig Dispense Refill Last Dose  . acetaminophen (TYLENOL) 500 MG tablet Take 1,000 mg by mouth every 6 (six) hours as needed for moderate pain.     . Blood Pressure Monitoring  KIT 1 kit by Does not apply route once a week. 1 kit 0   . cyclobenzaprine (FLEXERIL) 5 MG tablet Take 10 mg by mouth as needed for muscle spasms.      . ferrous sulfate 325 (65 FE) MG tablet Take 1 tablet (325 mg total) by mouth daily. 100 tablet 1   . Prenatal Vit-Fe Fumarate-FA (MULTIVITAMIN-PRENATAL) 27-0.8 MG TABS tablet Take 1 tablet by mouth daily at 12 noon. 30 tablet 11     Review of Systems  Constitutional: Negative for chills, diaphoresis, fatigue and fever.  Respiratory: Negative for shortness of breath.   Cardiovascular: Negative for chest pain.  Gastrointestinal: Negative for abdominal pain, constipation, diarrhea, nausea and vomiting.  Genitourinary: Positive for pelvic pain and vaginal bleeding. Negative for dysuria, flank pain, frequency, urgency and vaginal discharge.  Neurological: Negative for dizziness, weakness, light-headedness and headaches.   Physical Exam   Blood pressure 127/76, pulse 87, resp. rate 18, height '5\' 4"'  (1.626 m), weight 103.4 kg, last menstrual period 12/13/2018, SpO2 100 %.  Patient Vitals for the past  24 hrs:  BP Temp src Pulse Resp SpO2 Height Weight  03/15/19 1039 127/76 - 87 - - - -  03/15/19 1001 138/82 Oral 95 18 100 % '5\' 4"'  (1.626 m) 103.4 kg   Physical Exam  Constitutional: She is oriented to person, place, and time. She appears well-developed and well-nourished. No distress.  HENT:  Head: Normocephalic and atraumatic.  Respiratory: Effort normal.  GI: Soft. She exhibits no distension and no mass. There is no abdominal tenderness. There is no rebound and no guarding.  Genitourinary: There is no rash, tenderness or lesion on the right labia. There is no rash, tenderness or lesion on the left labia. Uterus is enlarged (c/w 13wk uterus). Uterus is not tender. Cervix exhibits no motion tenderness, no discharge and no friability. Right adnexum displays no mass, no tenderness and no fullness. Left adnexum displays no mass, no tenderness and no  fullness.    Vaginal bleeding present.     No vaginal discharge or tenderness.  There is bleeding in the vagina. No tenderness in the vagina.    Genitourinary Comments: Small amt of dark red, clotted blood in vaginal vault able to be cleared away with single Fox swab, small clot noted in cervical os. CE: long/closed/posterior   Neurological: She is alert and oriented to person, place, and time.  Skin: Skin is warm and dry. She is not diaphoretic.  Psychiatric: She has a normal mood and affect. Her behavior is normal. Judgment and thought content normal.   Results for orders placed or performed during the hospital encounter of 03/15/19 (from the past 24 hour(s))  Urinalysis, Routine w reflex microscopic     Status: Abnormal   Collection Time: 03/15/19 10:20 AM  Result Value Ref Range   Color, Urine YELLOW YELLOW   APPearance HAZY (A) CLEAR   Specific Gravity, Urine 1.018 1.005 - 1.030   pH 6.0 5.0 - 8.0   Glucose, UA NEGATIVE NEGATIVE mg/dL   Hgb urine dipstick LARGE (A) NEGATIVE   Bilirubin Urine NEGATIVE NEGATIVE   Ketones, ur NEGATIVE NEGATIVE mg/dL   Protein, ur NEGATIVE NEGATIVE mg/dL   Nitrite NEGATIVE NEGATIVE   Leukocytes,Ua NEGATIVE NEGATIVE   RBC / HPF 0-5 0 - 5 RBC/hpf   WBC, UA 0-5 0 - 5 WBC/hpf   Bacteria, UA FEW (A) NONE SEEN   Squamous Epithelial / LPF 0-5 0 - 5   Mucus PRESENT   Wet prep, genital     Status: Abnormal   Collection Time: 03/15/19 11:43 AM   Specimen: Vaginal  Result Value Ref Range   Yeast Wet Prep HPF POC NONE SEEN NONE SEEN   Trich, Wet Prep NONE SEEN NONE SEEN   Clue Cells Wet Prep HPF POC NONE SEEN NONE SEEN   WBC, Wet Prep HPF POC FEW (A) NONE SEEN   Sperm NONE SEEN   CBC     Status: Abnormal   Collection Time: 03/15/19 11:47 AM  Result Value Ref Range   WBC 7.1 4.0 - 10.5 K/uL   RBC 3.83 (L) 3.87 - 5.11 MIL/uL   Hemoglobin 10.9 (L) 12.0 - 15.0 g/dL   HCT 33.0 (L) 36.0 - 46.0 %   MCV 86.2 80.0 - 100.0 fL   MCH 28.5 26.0 - 34.0 pg    MCHC 33.0 30.0 - 36.0 g/dL   RDW 15.6 (H) 11.5 - 15.5 %   Platelets 285 150 - 400 K/uL   nRBC 0.0 0.0 - 0.2 %   US Ob Comp Less  14 Wks  Result Date: 03/15/2019 CLINICAL DATA:  Vaginal bleeding in 1st trimester pregnancy. Assigned GA currently 13 weeks 1 day by prior ultrasound. EXAM: OBSTETRIC <14 WK ULTRASOUND TECHNIQUE: Transabdominal ultrasound was performed for evaluation of the gestation as well as the maternal uterus and adnexal regions. COMPARISON:  01/06/2019 FINDINGS: Intrauterine gestational sac: Single Yolk sac:  Not Visualized. Embryo:  Visualized. Cardiac Activity: Visualized. Heart Rate: 146 bpm CRL:   79 mm   13 w 6 d                  [place] EDC: 09/14/2019 Subchorionic hemorrhage: Small subchorionic hemorrhage seen in the lower uterine segment along the inferior margin of the gestational sac. Maternal uterus/adnexae: No fibroids identified. Normal appearance of both ovaries. No adnexal mass or abnormal free fluid identified. IMPRESSION: Assigned GA currently 13 weeks 1 day.  Appropriate interval growth. Small subchorionic hemorrhage. Electronically Signed   By: Marlaine Hind M.D.   On: 03/15/2019 12:34   MAU Course  Procedures  MDM -vaginal bleeding in first trimester, intermittent since 7wks -UA: hazy/lg hgb/few bacteria, urine sent for culture based on symptoms -CBC: H/H 10.9/33 (was 10.3 two weeks ago, pt on iron) -[place]: single IUP, FHR 146, sm subchorionic hemorrhage -ABO: O Positive -WetPrep: few WBCs -GC/CT collected -pt discharged to home in stable condition  Orders Placed This Encounter  Procedures  . Culture, OB Urine    Standing Status:   Standing    Number of Occurrences:   1  . Wet prep, genital    Standing Status:   Standing    Number of Occurrences:   1  . US OB Comp Less 14 Wks    Standing Status:   Standing    Number of Occurrences:   1    Order Specific Question:   Symptom/Reason for Exam    Answer:   Vaginal bleeding in pregnancy [[id number]]  . Urinalysis,  Routine w reflex microscopic    Standing Status:   Standing    Number of Occurrences:   1  . CBC    Standing Status:   Standing    Number of Occurrences:   1  . Discharge patient    Order Specific Question:   Discharge disposition    Answer:   01-Home or Self Care [1]    Order Specific Question:   Discharge patient date    Answer:   03/15/2019   No orders of the defined types were placed in this encounter.  Assessment and Plan   1. Vaginal bleeding in pregnancy, first trimester   2. Blood type, Rh positive   3. Vaginal bleeding in pregnancy   4. Pelvic cramping in antepartum period   5. Subchorionic hemorrhage of placenta in first trimester, single or unspecified fetus    Allergies as of 03/15/2019   No Known Allergies     Medication List    TAKE these medications   acetaminophen 500 MG tablet Commonly known as: TYLENOL Take 1,000 mg by mouth every 6 (six) hours as needed for moderate pain.   Blood Pressure Monitoring Kit 1 kit by Does not apply route once a week.   cyclobenzaprine 5 MG tablet Commonly known as: FLEXERIL Take 10 mg by mouth as needed for muscle spasms.   ferrous sulfate 325 (65 FE) MG tablet Take 1 tablet (325 mg total) by mouth daily.   multivitamin-prenatal 27-0.8 MG Tabs tablet Take 1 tablet by mouth daily at 12 noon.      -  pt questions asked and answered extensively -in-depth conversation with patient regarding possible causes of bleeding in first trimester, use of [place] as a modality to assess for causes of bleeding and discussed that often no cause is able to be definitively identified -discussed ways to assess fetal well-being in early pregnancy and discussed reassurance of normal fetal heart tones -pt encouraged to make weekly appt at clinic for [facility] check, if desired -discussed s/sx of miscarriage and other causes for concern related to bleeding in the latter half of pregnancy -pt appears satisfied with answer regarding subchorionic hemorrhage  finding and bleeding -pt to return to MAU/call office if symptoms worsen/change -strict SAB/return MAU precautions given -pt discharged to home in stable condition  Elmyra Ricks E Joby Richart 03/15/2019, 1:34 PM

## 2019-03-15 NOTE — Discharge Instructions (Signed)
Subchorionic Hematoma ° °A subchorionic hematoma is a gathering of blood between the outer wall of the embryo (chorion) and the inner wall of the womb (uterus). °This condition can cause vaginal bleeding. If they cause little or no vaginal bleeding, early small hematomas usually shrink on their own and do not affect your baby or pregnancy. When bleeding starts later in pregnancy, or if the hematoma is larger or occurs in older pregnant women, the condition may be more serious. Larger hematomas may get bigger, which increases the chances of miscarriage. This condition also increases the risk of: °· Premature separation of the placenta from the uterus. °· Premature (preterm) labor. °· Stillbirth. °What are the causes? °The exact cause of this condition is not known. It occurs when blood is trapped between the placenta and the uterine wall because the placenta has separated from the original site of implantation. °What increases the risk? °You are more likely to develop this condition if: °· You were treated with fertility medicines. °· You conceived through in vitro fertilization (IVF). °What are the signs or symptoms? °Symptoms of this condition include: °· Vaginal spotting or bleeding. °· Contractions of the uterus. These cause abdominal pain. °Sometimes you may have no symptoms and the bleeding may only be seen when ultrasound images are taken (transvaginal ultrasound). °How is this diagnosed? °This condition is diagnosed based on a physical exam. This includes a pelvic exam. You may also have other tests, including: °· Blood tests. °· Urine tests. °· Ultrasound of the abdomen. °How is this treated? °Treatment for this condition can vary. Treatment may include: °· Watchful waiting. You will be monitored closely for any changes in bleeding. During this stage: °? The hematoma may be reabsorbed by the body. °? The hematoma may separate the fluid-filled space containing the embryo (gestational sac) from the wall of the  womb (endometrium). °· Medicines. °· Activity restriction. This may be needed until the bleeding stops. °Follow these instructions at home: °· Stay on bed rest if told to do so by your health care provider. °· Do not lift anything that is heavier than 10 lbs. (4.5 kg) or as told by your health care provider. °· Do not use any products that contain nicotine or tobacco, such as cigarettes and e-cigarettes. If you need help quitting, ask your health care provider. °· Track and write down the number of pads you use each day and how soaked (saturated) they are. °· Do not use tampons. °· Keep all follow-up visits as told by your health care provider. This is important. Your health care provider may ask you to have follow-up blood tests or ultrasound tests or both. °Contact a health care provider if: °· You have any vaginal bleeding. °· You have a fever. °Get help right away if: °· You have severe cramps in your stomach, back, abdomen, or pelvis. °· You pass large clots or tissue. Save any tissue for your health care provider to look at. °· You have more vaginal bleeding, and you faint or become lightheaded or weak. °Summary °· A subchorionic hematoma is a gathering of blood between the outer wall of the placenta and the uterus. °· This condition can cause vaginal bleeding. °· Sometimes you may have no symptoms and the bleeding may only be seen when ultrasound images are taken. °· Treatment may include watchful waiting, medicines, or activity restriction. °This information is not intended to replace advice given to you by your health care provider. Make sure you discuss any questions you   have with your health care provider. Document Released: 10/13/2006 Document Revised: 06/10/2017 Document Reviewed: 08/24/2016 Elsevier Patient Education  2020 [employer].   Vaginal Bleeding During Pregnancy, First Trimester  A small amount of bleeding from the vagina (spotting) is relatively common during early pregnancy. It  usually stops on its own. Various things may cause bleeding or spotting during early pregnancy. Some bleeding may be related to the pregnancy, and some may not. In many cases, the bleeding is normal and is not a problem. However, bleeding can also be a sign of something serious. Be sure to tell your health care provider about any vaginal bleeding right away. Some possible causes of vaginal bleeding during the first trimester include:  Infection or inflammation of the cervix.  Growths (polyps) on the cervix.  Miscarriage or threatened miscarriage.  Pregnancy tissue developing outside of the uterus (ectopic pregnancy).  A mass of tissue developing in the uterus due to an egg being fertilized incorrectly (molar pregnancy). Follow these instructions at home: Activity  Follow instructions from your health care provider about limiting your activity. Ask what activities are safe for you.  If needed, make plans for someone to help with your regular activities.  Do not have sex or orgasms until your health care provider says that this is safe. General instructions  Take over-the-counter and prescription medicines only as told by your health care provider.  Pay attention to any changes in your symptoms.  Do not use tampons or douche.  Write down how many pads you use each day, how often you change pads, and how soaked (saturated) they are.  If you pass any tissue from your vagina, save the tissue so you can show it to your health care provider.  Keep all follow-up visits as told by your health care provider. This is important. Contact a health care provider if:  You have vaginal bleeding during any part of your pregnancy.  You have cramps or labor pains.  You have a fever. Get help right away if:  You have severe cramps in your back or abdomen.  You pass large clots or a large amount of tissue from your vagina.  Your bleeding increases.  You feel light-headed or weak, or you  faint.  You have chills.  You are leaking fluid or have a gush of fluid from your vagina. Summary  A small amount of bleeding (spotting) from the vagina is relatively common during early pregnancy.  Various things may cause bleeding or spotting in early pregnancy.  Be sure to tell your health care provider about any vaginal bleeding right away. This information is not intended to replace advice given to you by your health care provider. Make sure you discuss any questions you have with your health care provider. Document Released: 04/07/2005 Document Revised: 10/17/2018 Document Reviewed: 09/30/2016 Elsevier Patient Education  2020 [employer].

## 2019-03-16 LAB — CULTURE, OB URINE

## 2019-03-16 LAB — GC/CHLAMYDIA PROBE AMP (~~LOC~~) NOT AT ARMC
Chlamydia: NEGATIVE
Neisseria Gonorrhea: NEGATIVE

## 2019-03-19 ENCOUNTER — Encounter (HOSPITAL_COMMUNITY): Payer: Self-pay | Admitting: *Deleted

## 2019-03-19 ENCOUNTER — Inpatient Hospital Stay (HOSPITAL_COMMUNITY)
Admission: AD | Admit: 2019-03-19 | Discharge: 2019-03-19 | Disposition: A | Discharge status: Home / Self Care | Payer: Medicaid Other | Attending: Obstetrics and Gynecology | Admitting: Obstetrics and Gynecology

## 2019-03-19 ENCOUNTER — Other Ambulatory Visit: Payer: Self-pay

## 2019-03-19 DIAGNOSIS — K5900 Constipation, unspecified: Secondary | ICD-10-CM

## 2019-03-19 DIAGNOSIS — O99611 Diseases of the digestive system complicating pregnancy, first trimester: Secondary | ICD-10-CM | POA: Diagnosis not present

## 2019-03-19 DIAGNOSIS — Z3A01 Less than 8 weeks gestation of pregnancy: Secondary | ICD-10-CM | POA: Diagnosis not present

## 2019-03-19 DIAGNOSIS — O99891 Dorsalgia, unspecified: Secondary | ICD-10-CM

## 2019-03-19 DIAGNOSIS — Z3A13 13 weeks gestation of pregnancy: Secondary | ICD-10-CM | POA: Insufficient documentation

## 2019-03-19 DIAGNOSIS — R109 Unspecified abdominal pain: Secondary | ICD-10-CM | POA: Diagnosis present

## 2019-03-19 DIAGNOSIS — M549 Dorsalgia, unspecified: Secondary | ICD-10-CM | POA: Diagnosis not present

## 2019-03-19 DIAGNOSIS — O9989 Other specified diseases and conditions complicating pregnancy, childbirth and the puerperium: Secondary | ICD-10-CM

## 2019-03-19 LAB — URINALYSIS, ROUTINE W REFLEX MICROSCOPIC
Bacteria, UA: NONE SEEN
Bilirubin Urine: NEGATIVE
Glucose, UA: NEGATIVE mg/dL
Ketones, ur: NEGATIVE mg/dL
Leukocytes,Ua: NEGATIVE
Nitrite: NEGATIVE
Protein, ur: NEGATIVE mg/dL
Specific Gravity, Urine: 1.023 (ref 1.005–1.030)
pH: 7 (ref 5.0–8.0)

## 2019-03-19 MED ORDER — COMFORT FIT MATERNITY SUPP MED MISC: 1.0000 | Freq: Every day | 0 refills | Status: DC

## 2019-03-19 MED ORDER — DOCUSATE SODIUM 100 MG PO CAPS: 100.0000 mg | ORAL_CAPSULE | Freq: Two times a day (BID) | ORAL | 2 refills | Status: DC | PRN

## 2019-03-19 NOTE — MAU Note (Signed)
Patient has been seen several times this pregnancy for vaginal bleeding, but states she is here today for low back and abdominal pain that started on 03/17/19.  Pain worsened 9/6 and has been constant and feels like "period cramps."  She took 1000mg  Tylenol last night and that didn't help.

## 2019-03-19 NOTE — Discharge Instructions (Signed)

## 2019-03-19 NOTE — MAU Provider Note (Signed)
Chief Complaint: Abdominal Pain and Back Pain   First Provider Initiated Contact with Patient 03/19/19 0917      SUBJECTIVE HPI: [name] is a 33 y.o. [id number] at 97w5dby LMP who presents to maternity admissions reporting back and abdominal pain. She reports cramping pain throughout her pregnancy but today the pain started in her low back and rectal area and radiated into her low abdomen.  The pain was sharp and constant and has now resolved with only a dull ache left. She reports light spotting occasionally has continued, unchanged from before. She has known small subchorionic hemorrhage on [place] She has tried Tylenol for the pain but it did not help. She reports recent constipation since she is on oral iron supplements and she takes Miralax PRN for this.  Last bowel movement was yesterday.   HPI  Past Medical History:  Diagnosis Date  . Acne   . Anemia   . Chlamydia   . Infection    UTI   Past Surgical History:  Procedure Laterality Date  . CESAREAN SECTION    . DILATION AND CURETTAGE OF UTERUS     Social History   Socioeconomic History  . Marital status: Married    Spouse name: Not on file  . Number of children: Not on file  . Years of education: Not on file  . Highest education level: Not on file  Occupational History  . Not on file  Social Needs  . Financial resource strain: Not on file  . Food insecurity    Worry: Not on file    Inability: Not on file  . Transportation needs    Medical: Not on file    Non-medical: Not on file  Tobacco Use  . Smoking status: Never Smoker  . Smokeless tobacco: Never Used  Substance and Sexual Activity  . Alcohol use: No  . Drug use: No  . Sexual activity: Not Currently  Lifestyle  . Physical activity    Days per week: Not on file    Minutes per session: Not on file  . Stress: Not on file  Relationships  . Social [place] phone: Not on file    Gets together: Not on file    Attends religious service: Not  on file    Active member of club or organization: Not on file    Attends meetings of clubs or organizations: Not on file    Relationship status: Not on file  . Intimate partner violence    Fear of current or ex partner: Not on file    Emotionally abused: Not on file    Physically abused: Not on file    Forced sexual activity: Not on file  Other Topics Concern  . Not on file  Social History Narrative  . Not on file   No current facility-administered medications on file prior to encounter.    Current Outpatient Medications on File Prior to Encounter  Medication Sig Dispense Refill  . acetaminophen (TYLENOL) 500 MG tablet Take 1,000 mg by mouth every 6 (six) hours as needed for moderate pain.    . cyclobenzaprine (FLEXERIL) 5 MG tablet Take 10 mg by mouth as needed for muscle spasms.     . ferrous sulfate 325 (65 FE) MG tablet Take 1 tablet (325 mg total) by mouth daily. 100 tablet 1  . Prenatal Vit-Fe Fumarate-FA (MULTIVITAMIN-PRENATAL) 27-0.8 MG TABS tablet Take 1 tablet by mouth daily at 12 noon. 30 tablet 11  .  Blood Pressure Monitoring KIT 1 kit by Does not apply route once a week. 1 kit 0   No Known Allergies  ROS:  Review of Systems  Constitutional: Negative for chills, fatigue and fever.  Respiratory: Negative for shortness of breath.   Cardiovascular: Negative for chest pain.  Gastrointestinal: Positive for constipation and rectal pain. Negative for diarrhea, nausea and vomiting.  Genitourinary: Positive for pelvic pain. Negative for difficulty urinating, dysuria, flank pain, vaginal bleeding, vaginal discharge and vaginal pain.  Musculoskeletal: Positive for back pain.  Neurological: Negative for dizziness and headaches.  Psychiatric/Behavioral: Negative.      I have reviewed patient's Past Medical Hx, Surgical Hx, Family Hx, Social Hx, medications and allergies.   Physical Exam   Patient Vitals for the past 24 hrs:  BP Temp Temp src Pulse Resp Weight  03/19/19  1026 124/67 - - 73 16 -  03/19/19 0835 122/75 98.5 F (36.9 C) Oral 89 18 -  03/19/19 0831 - - - - - 103 kg   Constitutional: Well-developed, well-nourished female in no acute distress.  Cardiovascular: normal rate Respiratory: normal effort GI: Abd soft, non-tender. Pos BS x 4 MS: Extremities nontender, no edema, normal ROM Neurologic: Alert and oriented x 4.  GU: Neg CVAT.  PELVIC EXAM: Deferred, normal cultures on 03/15/19  FHT 138 by doppler  LAB RESULTS Results for orders placed or performed during the hospital encounter of 03/19/19 (from the past 24 hour(s))  Urinalysis, Routine w reflex microscopic     Status: Abnormal   Collection Time: 03/19/19 10:12 AM  Result Value Ref Range   Color, Urine YELLOW YELLOW   APPearance HAZY (A) CLEAR   Specific Gravity, Urine 1.023 1.005 - 1.030   pH 7.0 5.0 - 8.0   Glucose, UA NEGATIVE NEGATIVE mg/dL   Hgb urine dipstick MODERATE (A) NEGATIVE   Bilirubin Urine NEGATIVE NEGATIVE   Ketones, ur NEGATIVE NEGATIVE mg/dL   Protein, ur NEGATIVE NEGATIVE mg/dL   Nitrite NEGATIVE NEGATIVE   Leukocytes,Ua NEGATIVE NEGATIVE   RBC / HPF 0-5 0 - 5 RBC/hpf   WBC, UA 0-5 0 - 5 WBC/hpf   Bacteria, UA NONE SEEN NONE SEEN   Squamous Epithelial / LPF 0-5 0 - 5   Mucus PRESENT     O/Positive/-- (08/19 1334)  IMAGING Bedside US reveals active fetus, FHR 140s, subjectively normal amniotic fluid, CRL c/w previous dates  Pt informed that the ultrasound is considered a limited OB ultrasound and is not intended to be a complete ultrasound exam.  Patient also informed that the ultrasound is not being completed with the intent of assessing for fetal or placental anomalies or any pelvic abnormalities.  Explained that the purpose of today's ultrasound is to assess for  viability.  Patient acknowledges the purpose of the exam and the limitations of the study.     MAU Management/MDM: Orders Placed This Encounter  Procedures  . Urinalysis, Routine w reflex  microscopic  . Discharge patient    Meds ordered this encounter  Medications  . docusate sodium (COLACE) 100 MG capsule    Sig: Take 1 capsule (100 mg total) by mouth 2 (two) times daily as needed.    Dispense:  30 capsule    Refill:  2    Order Specific Question:   Supervising Provider    Answer:   Aletha Halim [id number]  . Elastic Bandages & Supports (COMFORT FIT MATERNITY SUPP MED) MISC    Sig: 1 Device by Does not apply route  daily.    Dispense:  1 each    Refill:  0    Order Specific Question:   Supervising Provider    Answer:   Aletha Halim [[id number]]    Pt without acute abdomen, no CVA tenderness, UA without evidence of infection.  Bedside US for viability with normal FHR, reassuring for pt.  Constipation likely the cause of pt pain. Recommend adding stool softener PRN, Rx for Colace.  Rx for pregnancy support belt also, as discussed with pt, usually to start wearing at 19 weeks but can start sooner if desired.  Pt discharged with strict return precautions.  ASSESSMENT 1. Back pain affecting pregnancy in first trimester   2. Constipation during pregnancy in first trimester     PLAN Discharge home Allergies as of 03/19/2019   No Known Allergies     Medication List    TAKE these medications   acetaminophen 500 MG tablet Commonly known as: TYLENOL Take 1,000 mg by mouth every 6 (six) hours as needed for moderate pain.   Blood Pressure Monitoring Kit 1 kit by Does not apply route once a week.   Comfort Fit Maternity Supp Med Misc 1 Device by Does not apply route daily.   cyclobenzaprine 5 MG tablet Commonly known as: FLEXERIL Take 10 mg by mouth as needed for muscle spasms.   docusate sodium 100 MG capsule Commonly known as: COLACE Take 1 capsule (100 mg total) by mouth 2 (two) times daily as needed.   ferrous sulfate 325 (65 FE) MG tablet Take 1 tablet (325 mg total) by mouth daily.   multivitamin-prenatal 27-0.8 MG Tabs tablet Take 1 tablet by mouth  daily at 12 noon.        Fatima Blank Certified Nurse-Midwife 03/19/2019  12:44 PM

## 2019-03-20 ENCOUNTER — Encounter: Payer: Self-pay | Admitting: Obstetrics and Gynecology

## 2019-03-21 ENCOUNTER — Encounter: Payer: Self-pay | Admitting: Obstetrics and Gynecology

## 2019-03-21 ENCOUNTER — Telehealth: Payer: Self-pay

## 2019-03-21 DIAGNOSIS — D563 Thalassemia minor: Secondary | ICD-10-CM | POA: Insufficient documentation

## 2019-03-21 NOTE — Telephone Encounter (Signed)
Called pt to check on her and see if she has any UTI symptoms due to blood being in her urine a couple days ago. Pt reports she is not having any UTI symptoms at this time. Advised pt to call back should any symptoms develop. Pt verbalizes understanding.

## 2019-03-21 NOTE — Telephone Encounter (Signed)
-----   Message from Tarry Kos sent at 03/21/2019  3:29 PM EDT ----- Regarding: FW: call pt to check on pain  ----- Message ----- From: Elvera Maria, CNM Sent: 03/19/2019  12:48 PM EDT To: [facility] Subject: call pt to check on pain                       Pt has had multiple MAU visits for abdominal and back pain at 13 weeks. I saw her 03/19/19 for increased back pain and discharged her with her urine pending. She does have blood in her urine, but has had some intermittent spotting.  So, could someone call her this week and ask if she is still having back pain?  If she is, I would like her to have a lab only visit in the office for a urine culture.  Thank you.

## 2019-03-26 ENCOUNTER — Other Ambulatory Visit: Payer: Self-pay

## 2019-03-26 ENCOUNTER — Encounter: Payer: Self-pay | Admitting: Obstetrics and Gynecology

## 2019-03-26 ENCOUNTER — Ambulatory Visit (INDEPENDENT_AMBULATORY_CARE_PROVIDER_SITE_OTHER): Payer: Medicaid Other | Admitting: Obstetrics and Gynecology

## 2019-03-26 VITALS — BP 116/69 | HR 91 | Wt 229.0 lb

## 2019-03-26 DIAGNOSIS — E669 Obesity, unspecified: Secondary | ICD-10-CM

## 2019-03-26 DIAGNOSIS — O418X10 Other specified disorders of amniotic fluid and membranes, first trimester, not applicable or unspecified: Secondary | ICD-10-CM

## 2019-03-26 DIAGNOSIS — D563 Thalassemia minor: Secondary | ICD-10-CM

## 2019-03-26 DIAGNOSIS — IMO0001 Reserved for inherently not codable concepts without codable children: Secondary | ICD-10-CM

## 2019-03-26 DIAGNOSIS — Z3A14 14 weeks gestation of pregnancy: Secondary | ICD-10-CM

## 2019-03-26 DIAGNOSIS — O99212 Obesity complicating pregnancy, second trimester: Secondary | ICD-10-CM

## 2019-03-26 DIAGNOSIS — Z98891 History of uterine scar from previous surgery: Secondary | ICD-10-CM

## 2019-03-26 DIAGNOSIS — O468X1 Other antepartum hemorrhage, first trimester: Secondary | ICD-10-CM

## 2019-03-26 DIAGNOSIS — A6009 Herpesviral infection of other urogenital tract: Secondary | ICD-10-CM

## 2019-03-26 DIAGNOSIS — R8271 Bacteriuria: Secondary | ICD-10-CM

## 2019-03-26 DIAGNOSIS — Z3480 Encounter for supervision of other normal pregnancy, unspecified trimester: Secondary | ICD-10-CM

## 2019-03-26 DIAGNOSIS — O34219 Maternal care for unspecified type scar from previous cesarean delivery: Secondary | ICD-10-CM

## 2019-03-26 DIAGNOSIS — O468X2 Other antepartum hemorrhage, second trimester: Secondary | ICD-10-CM

## 2019-03-26 DIAGNOSIS — O98312 Other infections with a predominantly sexual mode of transmission complicating pregnancy, second trimester: Secondary | ICD-10-CM

## 2019-03-26 NOTE — Progress Notes (Signed)
   PRENATAL VISIT NOTE  Subjective:  [name] is a 33 y.o. [phone] at [redacted]w[redacted]d being seen today for ongoing prenatal care.  She is currently monitored for the following issues for this high-risk pregnancy and has ASTHMA; GBS bacteriuria; H/O cesarean section; Supervision of other normal pregnancy, antepartum; Adopted; Obesity (BMI 30-39.9); Genital herpes affecting pregnancy; Anemia affecting pregnancy; and Alpha thalassemia silent carrier on their problem list.  Patient reports bleeding with subchorionic hemorrhage diagnosis. States it has lightened up quite a bit. Reports occasional cramping.   . Vag. Bleeding: None.  Movement: Present. Denies leaking of fluid.   The following portions of the patient's history were reviewed and updated as appropriate: allergies, current medications, past family history, past medical history, past social history, past surgical history and problem list.   Objective:   Vitals:   03/26/19 1427  BP: 116/69  Pulse: 91  Weight: 229 lb (103.9 kg)    Fetal Status: Fetal Heart Rate (bpm): 142   Movement: Present     General:  Alert, oriented and cooperative. Patient is in no acute distress.  Skin: Skin is warm and dry. No rash noted.   Cardiovascular: Normal heart rate noted  Respiratory: Normal respiratory effort, no problems with respiration noted  Abdomen: Soft, gravid, appropriate for gestational age.  Pain/Pressure: Absent     Pelvic: Cervical exam deferred        Extremities: Normal range of motion.  Edema: None  Mental Status: Normal mood and affect. Normal behavior. Normal judgment and thought content.   Assessment and Plan:  Pregnancy: [id number] at [redacted]w[redacted]d  1. Supervision of other normal pregnancy, antepartum Cont PO iron Counseled regarding risks/benefits of flu vaccine, patient declines vaccine.   2. H/O cesarean section Will need repeat  3. GBS bacteriuria Neg urine culture sent   4. Obesity (BMI 30-39.9)  5. Alpha thalassemia silent  carrier Briefly reviewed, recommended genetic counseling, pt agreeable  6. Genital herpes affecting pregnancy in second trimester ppx 34 weeks  Preterm labor symptoms and general obstetric precautions including but not limited to vaginal bleeding, contractions, leaking of fluid and fetal movement were reviewed in detail with the patient. Please refer to After Visit Summary for other counseling recommendations.   Return in about 2 weeks (around 04/09/2019) for lab draw AFP.  Future Appointments  Date Time Provider [place]  04/18/2019  9:30 AM Shelly Bombard, MD [place] None  04/25/2019  1:00 PM [place] [facility]SEL[facility] [place] [facility]  04/25/2019  2:30 PM [place] 1 WH-[facility]    Sloan Leiter, MD

## 2019-03-26 NOTE — Progress Notes (Signed)
ROB wants to discuss recent Genetic Results.

## 2019-03-27 ENCOUNTER — Encounter: Payer: Self-pay | Admitting: Obstetrics and Gynecology

## 2019-04-05 ENCOUNTER — Other Ambulatory Visit: Payer: Self-pay | Admitting: Advanced Practice Midwife

## 2019-04-05 ENCOUNTER — Encounter (HOSPITAL_COMMUNITY): Payer: Self-pay

## 2019-04-05 ENCOUNTER — Inpatient Hospital Stay (HOSPITAL_COMMUNITY)
Admission: AD | Admit: 2019-04-05 | Discharge: 2019-04-05 | Disposition: A | Discharge status: Home / Self Care | Payer: Medicaid Other | Attending: Obstetrics and Gynecology | Admitting: Obstetrics and Gynecology

## 2019-04-05 ENCOUNTER — Other Ambulatory Visit: Payer: Self-pay

## 2019-04-05 DIAGNOSIS — O98312 Other infections with a predominantly sexual mode of transmission complicating pregnancy, second trimester: Secondary | ICD-10-CM | POA: Diagnosis not present

## 2019-04-05 DIAGNOSIS — R102 Pelvic and perineal pain: Secondary | ICD-10-CM | POA: Insufficient documentation

## 2019-04-05 DIAGNOSIS — Z3492 Encounter for supervision of normal pregnancy, unspecified, second trimester: Secondary | ICD-10-CM

## 2019-04-05 DIAGNOSIS — E669 Obesity, unspecified: Secondary | ICD-10-CM

## 2019-04-05 DIAGNOSIS — Z3A16 16 weeks gestation of pregnancy: Secondary | ICD-10-CM

## 2019-04-05 DIAGNOSIS — N898 Other specified noninflammatory disorders of vagina: Secondary | ICD-10-CM | POA: Diagnosis not present

## 2019-04-05 DIAGNOSIS — Z79899 Other long term (current) drug therapy: Secondary | ICD-10-CM | POA: Insufficient documentation

## 2019-04-05 DIAGNOSIS — B3731 Acute candidiasis of vulva and vagina: Secondary | ICD-10-CM

## 2019-04-05 DIAGNOSIS — O26892 Other specified pregnancy related conditions, second trimester: Secondary | ICD-10-CM | POA: Diagnosis not present

## 2019-04-05 DIAGNOSIS — B373 Candidiasis of vulva and vagina: Secondary | ICD-10-CM | POA: Diagnosis not present

## 2019-04-05 DIAGNOSIS — Z789 Other specified health status: Secondary | ICD-10-CM

## 2019-04-05 DIAGNOSIS — A6009 Herpesviral infection of other urogenital tract: Secondary | ICD-10-CM | POA: Diagnosis not present

## 2019-04-05 DIAGNOSIS — R8271 Bacteriuria: Secondary | ICD-10-CM

## 2019-04-05 DIAGNOSIS — Z0282 Encounter for adoption services: Secondary | ICD-10-CM

## 2019-04-05 LAB — URINALYSIS, ROUTINE W REFLEX MICROSCOPIC
Bilirubin Urine: NEGATIVE
Glucose, UA: NEGATIVE mg/dL
Hgb urine dipstick: NEGATIVE
Ketones, ur: NEGATIVE mg/dL
Leukocytes,Ua: NEGATIVE
Nitrite: NEGATIVE
Protein, ur: NEGATIVE mg/dL
Specific Gravity, Urine: 1.025 (ref 1.005–1.030)
pH: 5 (ref 5.0–8.0)

## 2019-04-05 LAB — WET PREP, GENITAL
Clue Cells Wet Prep HPF POC: NONE SEEN
Sperm: NONE SEEN
Trich, Wet Prep: NONE SEEN
Yeast Wet Prep HPF POC: NONE SEEN

## 2019-04-05 MED ORDER — TERCONAZOLE 0.8 % VA CREA: 1.0000 | TOPICAL_CREAM | Freq: Every day | VAGINAL | 0 refills | Status: DC

## 2019-04-05 NOTE — MAU Provider Note (Signed)
History     CSN: [id number]  Arrival date and time: 04/05/19 1726   First Provider Initiated Contact with Patient 04/05/19 1755     Chief Complaint  Patient presents with  . Vaginal Discharge   HPI [name] is a 33 y.o. [id number] at 36w1dwho presents to MAU with chief complaint of abnormal vaginal discharge. This is a new problem, onset three days ago. She denies dysuria, vaginal bleeding, vaginal itching, foul smell, fever or recent illness.  Patient is remote from sexual intercourse due to previously diagnosed subchorionic hematoma and need for pelvic rest. She denies concern for STIs.  OB History    Gravida  6   Para  3   Term  3   Preterm  0   AB  2   Living  3     SAB  0   TAB  2   Ectopic  0   Multiple  0   Live Births  3           Past Medical History:  Diagnosis Date  . Acne   . Anemia   . Chlamydia   . Infection    UTI    Past Surgical History:  Procedure Laterality Date  . CESAREAN SECTION    . DILATION AND CURETTAGE OF UTERUS      Family History  Adopted: Yes  Problem Relation Age of Onset  . Other Neg Hx     Social History   Tobacco Use  . Smoking status: Never Smoker  . Smokeless tobacco: Never Used  Substance Use Topics  . Alcohol use: No  . Drug use: No    Allergies: No Known Allergies  Medications Prior to Admission  Medication Sig Dispense Refill Last Dose  . acetaminophen (TYLENOL) 500 MG tablet Take 1,000 mg by mouth every 6 (six) hours as needed for moderate pain.   Past Week at Unknown time  . Blood Pressure Monitoring KIT 1 kit by Does not apply route once a week. 1 kit 0 04/04/2019 at Unknown time  . cyclobenzaprine (FLEXERIL) 5 MG tablet Take 10 mg by mouth as needed for muscle spasms.    Past Week at Unknown time  . ferrous sulfate 325 (65 FE) MG tablet Take 1 tablet (325 mg total) by mouth daily. 100 tablet 1 04/05/2019 at Unknown time  . Prenatal Vit-Fe Fumarate-FA (MULTIVITAMIN-PRENATAL) 27-0.8 MG TABS  tablet Take 1 tablet by mouth daily at 12 noon. 30 tablet 11 04/05/2019 at Unknown time  . docusate sodium (COLACE) 100 MG capsule Take 1 capsule (100 mg total) by mouth 2 (two) times daily as needed. 30 capsule 2 More than a month at Unknown time  . Elastic Bandages & Supports (COMFORT FIT MATERNITY SUPP MED) MISC 1 Device by Does not apply route daily. 1 each 0     Review of Systems  Gastrointestinal: Negative for abdominal pain.  Genitourinary: Positive for pelvic pain and vaginal discharge. Negative for vaginal bleeding and vaginal pain.  Musculoskeletal: Negative for back pain.  All other systems reviewed and are negative.  Physical Exam   Blood pressure 140/71, pulse 95, temperature 98.5 F (36.9 C), resp. rate 18, last menstrual period 12/13/2018.  Physical Exam  Nursing note and vitals reviewed. Constitutional: She is oriented to person, place, and time. She appears well-developed and well-nourished.  Cardiovascular: Normal rate.  Respiratory: Effort normal.  GI: Soft. She exhibits no distension. There is no abdominal tenderness. There is no rebound and no guarding.  Genitourinary:    Vaginal discharge present.     Genitourinary Comments: Thick white vaginal discharge proximal to cervical os.   Neurological: She is alert and oriented to person, place, and time.  Skin: Skin is warm and dry.  Psychiatric: She has a normal mood and affect. Her behavior is normal. Judgment and thought content normal.    MAU Course/MDM  Procedures: sterile speculum exam  --Offered patient presumptive treatment for yeast infection based on physical exam despite wet prep results. Pt requests treatment --Negative pooling, negative leaking of fluid on SSE  Patient Vitals for the past 24 hrs:  BP Temp Pulse Resp  04/05/19 1842 104/74 - (!) 107 -  04/05/19 1742 140/71 98.5 F (36.9 C) 95 18   Results for orders placed or performed during the hospital encounter of 04/05/19 (from the past 24  hour(s))  Wet prep, genital     Status: Abnormal   Collection Time: 04/05/19  6:01 PM   Specimen: Cervix  Result Value Ref Range   Yeast Wet Prep HPF POC NONE SEEN NONE SEEN   Trich, Wet Prep NONE SEEN NONE SEEN   Clue Cells Wet Prep HPF POC NONE SEEN NONE SEEN   WBC, Wet Prep HPF POC FEW (A) NONE SEEN   Sperm NONE SEEN   Urinalysis, Routine w reflex microscopic     Status: Abnormal   Collection Time: 04/05/19  6:10 PM  Result Value Ref Range   Color, Urine YELLOW YELLOW   APPearance HAZY (A) CLEAR   Specific Gravity, Urine 1.025 1.005 - 1.030   pH 5.0 5.0 - 8.0   Glucose, UA NEGATIVE NEGATIVE mg/dL   Hgb urine dipstick NEGATIVE NEGATIVE   Bilirubin Urine NEGATIVE NEGATIVE   Ketones, ur NEGATIVE NEGATIVE mg/dL   Protein, ur NEGATIVE NEGATIVE mg/dL   Nitrite NEGATIVE NEGATIVE   Leukocytes,Ua NEGATIVE NEGATIVE   Meds ordered this encounter  Medications  . terconazole (TERAZOL 3) 0.8 % vaginal cream    Sig: Place 1 applicator vaginally at bedtime. Apply nightly for three nights.    Dispense:  20 g    Refill:  0    Order Specific Question:   Supervising Provider    Answer:   Chancy Milroy [[id number]]   Assessment and Plan  --33 y.o. [id number] at [redacted]w[redacted]d --[id number] by Doppler --Rx Terazol based on abnormal discharge on exam --Discharge home in stable condition  F/U: Next OB appt 04/18/19 [place] CNM 04/05/2019, 7:15 PM

## 2019-04-05 NOTE — MAU Note (Signed)
Pt reports she has a watery discharge for the past 3 days. Fluid is clear. Reports some Increased pelvic pressure.

## 2019-04-05 NOTE — Discharge Instructions (Signed)

## 2019-04-06 LAB — CERVICOVAGINAL ANCILLARY ONLY
Chlamydia: NEGATIVE
Neisseria Gonorrhea: NEGATIVE

## 2019-04-09 ENCOUNTER — Other Ambulatory Visit: Payer: Medicaid Other

## 2019-04-09 ENCOUNTER — Other Ambulatory Visit: Payer: Self-pay

## 2019-04-09 DIAGNOSIS — Z3480 Encounter for supervision of other normal pregnancy, unspecified trimester: Secondary | ICD-10-CM

## 2019-04-10 ENCOUNTER — Telehealth: Payer: Self-pay

## 2019-04-10 NOTE — Telephone Encounter (Signed)
Returned call, pt complains of back pain, states that she picked up a maternity belt today. Advised pt to try belt to see if pain is relieved and if not let us know. Pt reports fetal movement, denies vaginal pressure and bleeding.

## 2019-04-12 LAB — AFP, SERUM, OPEN SPINA BIFIDA
AFP MoM: 2.06
AFP Value: 62.3 ng/mL
Gest. Age on Collection Date: 16.5 weeks
Maternal Age At EDD: 33.4 yr
OSBR Risk 1 IN: 1411
Test Results:: NEGATIVE
Weight: 229 [lb_av]

## 2019-04-18 ENCOUNTER — Encounter: Payer: Self-pay | Admitting: *Deleted

## 2019-04-18 ENCOUNTER — Telehealth (INDEPENDENT_AMBULATORY_CARE_PROVIDER_SITE_OTHER): Payer: Medicaid Other | Admitting: Obstetrics

## 2019-04-18 ENCOUNTER — Encounter (HOSPITAL_COMMUNITY): Payer: Self-pay

## 2019-04-18 ENCOUNTER — Inpatient Hospital Stay (HOSPITAL_COMMUNITY)
Admission: AD | Admit: 2019-04-18 | Discharge: 2019-04-18 | Disposition: A | Discharge status: Home / Self Care | Payer: Medicaid Other | Attending: Obstetrics & Gynecology | Admitting: Obstetrics & Gynecology

## 2019-04-18 ENCOUNTER — Encounter: Payer: Self-pay | Admitting: Obstetrics

## 2019-04-18 ENCOUNTER — Other Ambulatory Visit: Payer: Self-pay

## 2019-04-18 DIAGNOSIS — R109 Unspecified abdominal pain: Secondary | ICD-10-CM | POA: Insufficient documentation

## 2019-04-18 DIAGNOSIS — Z98891 History of uterine scar from previous surgery: Secondary | ICD-10-CM

## 2019-04-18 DIAGNOSIS — O468X2 Other antepartum hemorrhage, second trimester: Secondary | ICD-10-CM

## 2019-04-18 DIAGNOSIS — Z79899 Other long term (current) drug therapy: Secondary | ICD-10-CM | POA: Insufficient documentation

## 2019-04-18 DIAGNOSIS — O418X12 Other specified disorders of amniotic fluid and membranes, first trimester, fetus 2: Secondary | ICD-10-CM

## 2019-04-18 DIAGNOSIS — O26892 Other specified pregnancy related conditions, second trimester: Secondary | ICD-10-CM | POA: Insufficient documentation

## 2019-04-18 DIAGNOSIS — O0990 Supervision of high risk pregnancy, unspecified, unspecified trimester: Secondary | ICD-10-CM

## 2019-04-18 DIAGNOSIS — O98312 Other infections with a predominantly sexual mode of transmission complicating pregnancy, second trimester: Secondary | ICD-10-CM

## 2019-04-18 DIAGNOSIS — E669 Obesity, unspecified: Secondary | ICD-10-CM

## 2019-04-18 DIAGNOSIS — Z3A18 18 weeks gestation of pregnancy: Secondary | ICD-10-CM | POA: Insufficient documentation

## 2019-04-18 DIAGNOSIS — R8271 Bacteriuria: Secondary | ICD-10-CM

## 2019-04-18 DIAGNOSIS — O99820 Streptococcus B carrier state complicating pregnancy: Secondary | ICD-10-CM

## 2019-04-18 DIAGNOSIS — O468X1 Other antepartum hemorrhage, first trimester: Secondary | ICD-10-CM

## 2019-04-18 DIAGNOSIS — O99212 Obesity complicating pregnancy, second trimester: Secondary | ICD-10-CM

## 2019-04-18 DIAGNOSIS — O418X10 Other specified disorders of amniotic fluid and membranes, first trimester, not applicable or unspecified: Secondary | ICD-10-CM

## 2019-04-18 DIAGNOSIS — O99012 Anemia complicating pregnancy, second trimester: Secondary | ICD-10-CM

## 2019-04-18 DIAGNOSIS — O0992 Supervision of high risk pregnancy, unspecified, second trimester: Secondary | ICD-10-CM

## 2019-04-18 DIAGNOSIS — A6009 Herpesviral infection of other urogenital tract: Secondary | ICD-10-CM

## 2019-04-18 LAB — URINALYSIS, ROUTINE W REFLEX MICROSCOPIC
Bilirubin Urine: NEGATIVE
Glucose, UA: NEGATIVE mg/dL
Hgb urine dipstick: NEGATIVE
Ketones, ur: NEGATIVE mg/dL
Leukocytes,Ua: NEGATIVE
Nitrite: NEGATIVE
Protein, ur: NEGATIVE mg/dL
Specific Gravity, Urine: 1.01 (ref 1.005–1.030)
pH: 8 (ref 5.0–8.0)

## 2019-04-18 NOTE — Discharge Instructions (Signed)
Warning Signs During Pregnancy °A pregnancy lasts about 40 weeks, starting from the first day of your last period until the baby is born. Pregnancy is divided into three phases called trimesters. °· The first trimester refers to week 1 through week 13 of pregnancy. °· The second trimester is the start of week 14 through the end of week 27. °· The third trimester is the start of week 28 until you deliver your baby. °During each trimester of pregnancy, certain signs and symptoms may indicate a problem. Talk with your health care provider about your current health and any medical conditions you have. Make sure you know the symptoms that you should watch for and report. °How does this affect me? ° °Warning signs in the first trimester °While some changes during the first trimester may be uncomfortable, most do not represent a serious problem. Let your health care provider know if you have any of the following warning signs in the first trimester: °· You cannot eat or drink without vomiting, and this lasts for longer than a day. °· You have vaginal bleeding or spotting along with menstrual-like cramping. °· You have diarrhea for longer than a day. °· You have a fever or other signs of infection, such as: °? Pain or burning when you urinate. °? Foul smelling or thick or yellowish vaginal discharge. °Warning signs in the second trimester °As your baby grows and changes during the second trimester, there are additional signs and symptoms that may indicate a problem. These include: °· Signs and symptoms of infection, including a fever. °· Signs or symptoms of a miscarriage or preterm labor, such as regular contractions, menstrual-like cramping, or lower abdominal pain. °· Bloody or watery vaginal discharge or obvious vaginal bleeding. °· Feeling like your heart is pounding. °· Having trouble breathing. °· Nausea, vomiting, or diarrhea that lasts for longer than a day. °· Craving non-food items, such as clay, chalk, or dirt.  This may be a sign of a very treatable medical condition called pica. °Later in your second trimester, watch for signs and symptoms of a serious medical condition called preeclampsia.These include: °· Changes in your vision. °· A severe headache that does not go away. °· Nausea and vomiting. °It is also important to notice if your baby stops moving or moves less than usual during this time. °Warning signs in the third trimester °As you approach the third trimester, your baby is growing and your body is preparing for the birth of your baby. In your third trimester, be sure to let your health care provider know if: °· You have signs and symptoms of infection, including a fever. °· You have vaginal bleeding. °· You notice that your baby is moving less than usual or is not moving. °· You have nausea, vomiting, or diarrhea that lasts for longer than a day. °· You have a severe headache that does not go away. °· You have vision changes, including seeing spots or having blurry or double vision. °· You have increased swelling in your hands or face. °How does this affect my baby? °Throughout your pregnancy, always report any of the warning signs of a problem to your health care provider. This can help prevent complications that may affect your baby, including: °· Increased risk for premature birth. °· Infection that may be transmitted to your baby. °· Increased risk for stillbirth. °Contact a health care provider if: °· You have any of the warning signs of a problem for the current trimester of your pregnancy. °·   Any of the following apply to you during any trimester of pregnancy: °? You have strong emotions, such as sadness or anxiety, that interfere with work or personal relationships. °? You feel unsafe in your home and need help finding a safe place to live. °? You are using tobacco products, alcohol, or drugs and you need help to stop. °Get help right away if: °You have signs or symptoms of labor before 37 weeks of  pregnancy. These include: °· Contractions that are 5 minutes or less apart, or that increase in frequency, intensity, or length. °· Sudden, sharp abdominal pain or low back pain. °· Uncontrolled gush or trickle of fluid from your vagina. °Summary °· A pregnancy lasts about 40 weeks, starting from the first day of your last period until the baby is born. Pregnancy is divided into three phases called trimesters. Each trimester has warning signs to watch for. °· Always report any warning signs to your health care provider in order to prevent complications that may affect both you and your baby. °· Talk with your health care provider about your current health and any medical conditions you have. Make sure you know the symptoms that you should watch for and report. °This information is not intended to replace advice given to you by your health care provider. Make sure you discuss any questions you have with your health care provider. °Document Released: 04/14/2017 Document Revised: 10/17/2018 Document Reviewed: 04/14/2017 °Elsevier Patient Education © 2020 [employer] ° °

## 2019-04-18 NOTE — MAU Provider Note (Addendum)
Chief Complaint: Abdominal Pain   First Provider Initiated Contact with Patient 04/18/19 1932     SUBJECTIVE HPI: [name] is a 33 y.o. [id number] at 23w0dwho presents to Maternity Admissions reporting 3 day history of suprapubic discomfort that she describes as a "thumping" or "poking" sensation and a multi-week history of lower back pain unrelieved by tylenol and partially alleviated by pelvic belt. She denies any new diarrhea, constipation, dysuria, hematuria, vaginal discharge, vaginal bleeding, or vaginal discomfort. She reports that she has not yet felt fetal movements with this pregnancy. She states that she has been on pelvic rest following her Dx with subchorionic hematoma so she has not had intercourse in the last few weeks. Prior tests 2 weeks ago were negative for gonorrhea and chlamydia and she does not have a history of trichomonas. She is primarily concerned because this sensation is different compared to prior pregnancies. She also endorsed occasional nonpainful BH contractions at ~4/hour.    Past Medical History:  Diagnosis Date  . Acne   . Anemia   . Chlamydia   . Infection    UTI   OB History  Gravida Para Term Preterm AB Living  _0 0 2 3  SAB TAB Ectopic Multiple Live Births  0 2 0 0 3    # Outcome Date GA Lbr Len/2nd Weight Sex Delivery Anes PTL Lv  6 Current           5 Term 10/26/10    F CS-LTranv   LIV     Birth Comments: repeat c/section  4 Term 05/17/07    F CS-LTranv   LIV     Birth Comments: repeat c/section,  failed VBAC  3 Term 08/12/05    M CS-LTranv   LIV     Birth Comments: fetal distress  2 TAB      TAB     1 TAB            Past Surgical History:  Procedure Laterality Date  . CESAREAN SECTION    . DILATION AND CURETTAGE OF UTERUS     Social History   Socioeconomic History  . Marital status: Married    Spouse name: Not on file  . Number of children: Not on file  . Years of education: Not on file  . Highest education level: Not on  file  Occupational History  . Not on file  Social Needs  . Financial resource strain: Not on file  . Food insecurity    Worry: Not on file    Inability: Not on file  . Transportation needs    Medical: Not on file    Non-medical: Not on file  Tobacco Use  . Smoking status: Never Smoker  . Smokeless tobacco: Never Used  Substance and Sexual Activity  . Alcohol use: No  . Drug use: No  . Sexual activity: Not Currently  Lifestyle  . Physical activity    Days per week: Not on file    Minutes per session: Not on file  . Stress: Not on file  Relationships  . Social [place] phone: Not on file    Gets together: Not on file    Attends religious service: Not on file    Active member of club or organization: Not on file    Attends meetings of clubs or organizations: Not on file    Relationship status: Not on file  . Intimate partner violence    Fear  of current or ex partner: Not on file    Emotionally abused: Not on file    Physically abused: Not on file    Forced sexual activity: Not on file  Other Topics Concern  . Not on file  Social History Narrative  . Not on file   Family History  Adopted: Yes  Problem Relation Age of Onset  . Other Neg Hx    No current facility-administered medications on file prior to encounter.    Current Outpatient Medications on File Prior to Encounter  Medication Sig Dispense Refill  . cyclobenzaprine (FLEXERIL) 5 MG tablet Take 10 mg by mouth as needed for muscle spasms.     Marland Kitchen terconazole (TERAZOL 3) 0.8 % vaginal cream Place 1 applicator vaginally at bedtime. Apply nightly for three nights. 20 g 0  . acetaminophen (TYLENOL) 500 MG tablet Take 1,000 mg by mouth every 6 (six) hours as needed for moderate pain.    . Blood Pressure Monitoring KIT 1 kit by Does not apply route once a week. 1 kit 0  . docusate sodium (COLACE) 100 MG capsule Take 1 capsule (100 mg total) by mouth 2 (two) times daily as needed. 30 capsule 2  . Elastic  Bandages & Supports (COMFORT FIT MATERNITY SUPP MED) MISC 1 Device by Does not apply route daily. 1 each 0  . ferrous sulfate 325 (65 FE) MG tablet Take 1 tablet (325 mg total) by mouth daily. 100 tablet 1  . Prenatal Vit-Fe Fumarate-FA (MULTIVITAMIN-PRENATAL) 27-0.8 MG TABS tablet Take 1 tablet by mouth daily at 12 noon. 30 tablet 11   No Known Allergies  I have reviewed patient's Past Medical Hx, Surgical Hx, Family Hx, Social Hx, medications and allergies.   Review of Systems  Constitutional: Negative for activity change, appetite change, fever and unexpected weight change.  Respiratory: Negative for chest tightness and shortness of breath.   Gastrointestinal: Negative for abdominal pain, constipation, diarrhea, nausea and vomiting.  Genitourinary: Positive for pelvic pain. Negative for decreased urine volume, difficulty urinating, dyspareunia, dysuria, enuresis, flank pain, frequency, hematuria, urgency, vaginal bleeding, vaginal discharge and vaginal pain.  Musculoskeletal: Positive for back pain.  Neurological: Negative for weakness.    OBJECTIVE Patient Vitals for the past 24 hrs:  BP Temp Temp src Pulse SpO2  04/18/19 1950 128/66 - - 76 -  04/18/19 1930 - 98.2 F (36.8 C) Oral - 100 %   Constitutional: Well-developed, well-nourished female in no acute distress.  Cardiovascular: normal rate & rhythm, no murmur Respiratory: normal rate and effort. Lung sounds clear throughout GI: Abd soft, non-tender, Pos BS x 4. No guarding or rebound tenderness MS: Extremities nontender, no edema, normal ROM Neurologic: Alert and oriented x 4.  GU: cervix closed, long, and firm  LAB RESULTS Results for orders placed or performed during the hospital encounter of 04/18/19 (from the past 24 hour(s))  Urinalysis, Routine w reflex microscopic     Status: None   Collection Time: 04/18/19  7:22 PM  Result Value Ref Range   Color, Urine YELLOW YELLOW   APPearance CLEAR CLEAR   Specific Gravity,  Urine 1.010 1.005 - 1.030   pH 8.0 5.0 - 8.0   Glucose, UA NEGATIVE NEGATIVE mg/dL   Hgb urine dipstick NEGATIVE NEGATIVE   Bilirubin Urine NEGATIVE NEGATIVE   Ketones, ur NEGATIVE NEGATIVE mg/dL   Protein, ur NEGATIVE NEGATIVE mg/dL   Nitrite NEGATIVE NEGATIVE   Leukocytes,Ua NEGATIVE NEGATIVE    IMAGING No results found.  MAU COURSE  Orders Placed This Encounter  Procedures  . Urinalysis, Routine w reflex microscopic  . Discharge patient   No orders of the defined types were placed in this encounter.  During this admission, [name] was worked up for suprapubic/pelvic discomfort. Her UA is bland, ruling out UTI or common bladder/renal causes of her suprapubic discomfort. We completed a cervical exam, to ensure that her BH contractions and suprapubic discomfort were not concerning for atypical preterm labor or cervical insufficiency. This exam was reassuring.   MDM  ASSESSMENT 1. Abdominal pain during pregnancy in second trimester   2. [redacted] weeks gestation of pregnancy    During this admission, [name] was worked up for suprapubic/pelvic discomfort. Given her normal UA and reassuring cervical exam, it is most likely that her presentation is related to normal fetal movement.   PLAN Discharge home in stable condition. Continue pelvic rest until cleared by outpat OBGYN next week due to Hx subchorionic hematoma Continue Tylenol 544m q6 and flexeril 149mas needed for lower back pain and muscle spasms Continue other at-home medications as below  Encouraged patient to continue monitoring symptoms and to return to [facility] outpatient provider if she developed BH contractions >6x/hour for >2 hours, intractable vomiting, dysuria, hematuria, or unremitting pelvic or suprapubic pain. We encouraged the use of her maternity support belt, tylenol, rice heating pack, and considering physical therapy for her low back pain.   Allergies as of 04/18/2019   No Known Allergies      Medication List    STOP taking these medications   terconazole 0.8 % vaginal cream Commonly known as: TERAZOL 3     TAKE these medications   acetaminophen 500 MG tablet Commonly known as: TYLENOL Take 1,000 mg by mouth every 6 (six) hours as needed for moderate pain.   Blood Pressure Monitoring Kit 1 kit by Does not apply route once a week.   Comfort Fit Maternity Supp Med Misc 1 Device by Does not apply route daily.   cyclobenzaprine 5 MG tablet Commonly known as: FLEXERIL Take 10 mg by mouth as needed for muscle spasms.   docusate sodium 100 MG capsule Commonly known as: COLACE Take 1 capsule (100 mg total) by mouth 2 (two) times daily as needed.   ferrous sulfate 325 (65 FE) MG tablet Take 1 tablet (325 mg total) by mouth daily.   multivitamin-prenatal 27-0.8 MG Tabs tablet Take 1 tablet by mouth daily at 12 noon.        DoKathryne ErikssonMedical Student 04/18/2019  7:57 PM   I confirm that I have verified the information documented in the medical student's note and that I have also personally performed the history, physical exam and all medical decision making activities of this service and have verified that all service and findings are accurately documented in this student's note.  Pt in NAD with normal vital signs. FHT present via doppler. Cervix closed/thick. Pt reassured.    Nicole GuildNP 04/20/2019 7:59 PM

## 2019-04-18 NOTE — Progress Notes (Signed)
   [place] VIRTUAL VIDEO VISIT ENCOUNTER NOTE  Provider location: [facility] [place]   I connected with [name] on 04/18/19 at  9:30 AM EDT by OB MyChart Video Encounter at home and verified that I am speaking with the correct person using two identifiers.   I discussed the limitations, risks, security and privacy concerns of performing an evaluation and management service virtually and the availability of in person appointments. I also discussed with the patient that there may be a patient responsible charge related to this service. The patient expressed understanding and agreed to proceed. Subjective:  [name] is a 33 y.o. [phone] at [redacted]w[redacted]d being seen today for ongoing prenatal care.  She is currently monitored for the following issues for this high-risk pregnancy and has ASTHMA; GBS bacteriuria; H/O cesarean section; Supervision of other normal pregnancy, antepartum; Adopted; Obesity (BMI 30-39.9); Genital herpes affecting pregnancy; Anemia affecting pregnancy; and Alpha thalassemia silent carrier on their problem list.  Patient reports backache.  Contractions: Not present. Vag. Bleeding: None.  Movement: Present. Denies any leaking of fluid.   The following portions of the patient's history were reviewed and updated as appropriate: allergies, current medications, past family history, past medical history, past social history, past surgical history and problem list.   Objective:  There were no vitals filed for this visit.  Fetal Status:     Movement: Present     General:  Alert, oriented and cooperative. Patient is in no acute distress.  Respiratory: Normal respiratory effort, no problems with respiration noted  Mental Status: Normal mood and affect. Normal behavior. Normal judgment and thought content.  Rest of physical exam deferred due to type of encounter  Imaging: No results found.  Assessment and Plan:  Pregnancy:  [id number] at [redacted]w[redacted]d 1. Supervision of high risk pregnancy, antepartum  2. Subchorionic hemorrhage of placenta in first trimester, single or unspecified fetus - follow up ultrasound scheduled  3. H/O cesarean section x 3 - will need repeat C/S  4. Obesity (BMI 30-39.9)  5. Anemia affecting pregnancy in second trimester - FeSO4 Rx  6. GBS bacteriuria - treat in labor  7. Genital herpes affecting pregnancy, antepartum - Valtrex suppression starting 3rd trimester   Preterm labor symptoms and general obstetric precautions including but not limited to vaginal bleeding, contractions, leaking of fluid and fetal movement were reviewed in detail with the patient. I discussed the assessment and treatment plan with the patient. The patient was provided an opportunity to ask questions and all were answered. The patient agreed with the plan and demonstrated an understanding of the instructions. The patient was advised to call back or seek an in-person office evaluation/go to MAU at [facility] for any urgent or concerning symptoms. Please refer to After Visit Summary for other counseling recommendations.   I provided [employer] only Return in about 4 weeks (around 05/16/2019) for MyChart.  Future Appointments  Date Time Provider [place]  04/25/2019  1:00 PM [place] [facility]SEL[facility] [place] [facility]  04/25/2019  2:30 PM [facility] [place] 1 [facility]    Baltazar Najjar, [place] [facility] [place] [facility] 04/18/2019

## 2019-04-18 NOTE — MAU Note (Addendum)
Presents with lower abd pain versus "bladder pain" for approx 3 days. Also c/o a "burning" sensation in mid-lower back. Pain 4/10.  No UTI sx's. No FM. No vag bldg or LOF.  Gilmer Mor RN

## 2019-04-18 NOTE — Progress Notes (Signed)
ROB Virtual Visit.   B/P : 128/76 P : 86 Pt checked B/P today at home

## 2019-04-19 ENCOUNTER — Inpatient Hospital Stay (HOSPITAL_COMMUNITY)
Admission: AD | Admit: 2019-04-19 | Discharge: 2019-04-19 | Disposition: A | Discharge status: Home / Self Care | Payer: Medicaid Other | Attending: Obstetrics and Gynecology | Admitting: Obstetrics and Gynecology

## 2019-04-19 ENCOUNTER — Encounter (HOSPITAL_COMMUNITY): Payer: Self-pay

## 2019-04-19 ENCOUNTER — Other Ambulatory Visit: Payer: Self-pay

## 2019-04-19 DIAGNOSIS — R8271 Bacteriuria: Secondary | ICD-10-CM

## 2019-04-19 DIAGNOSIS — O99012 Anemia complicating pregnancy, second trimester: Secondary | ICD-10-CM | POA: Diagnosis not present

## 2019-04-19 DIAGNOSIS — E669 Obesity, unspecified: Secondary | ICD-10-CM

## 2019-04-19 DIAGNOSIS — Z79899 Other long term (current) drug therapy: Secondary | ICD-10-CM | POA: Diagnosis not present

## 2019-04-19 DIAGNOSIS — O34219 Maternal care for unspecified type scar from previous cesarean delivery: Secondary | ICD-10-CM | POA: Insufficient documentation

## 2019-04-19 DIAGNOSIS — O99891 Other specified diseases and conditions complicating pregnancy: Secondary | ICD-10-CM | POA: Insufficient documentation

## 2019-04-19 DIAGNOSIS — A6009 Herpesviral infection of other urogenital tract: Secondary | ICD-10-CM

## 2019-04-19 DIAGNOSIS — Z0282 Encounter for adoption services: Secondary | ICD-10-CM

## 2019-04-19 DIAGNOSIS — D649 Anemia, unspecified: Secondary | ICD-10-CM | POA: Diagnosis not present

## 2019-04-19 DIAGNOSIS — Z3A18 18 weeks gestation of pregnancy: Secondary | ICD-10-CM

## 2019-04-19 DIAGNOSIS — O26892 Other specified pregnancy related conditions, second trimester: Secondary | ICD-10-CM | POA: Diagnosis not present

## 2019-04-19 DIAGNOSIS — R109 Unspecified abdominal pain: Secondary | ICD-10-CM | POA: Diagnosis not present

## 2019-04-19 DIAGNOSIS — Z711 Person with feared health complaint in whom no diagnosis is made: Secondary | ICD-10-CM

## 2019-04-19 LAB — URINALYSIS, ROUTINE W REFLEX MICROSCOPIC
Bilirubin Urine: NEGATIVE
Glucose, UA: NEGATIVE mg/dL
Hgb urine dipstick: NEGATIVE
Ketones, ur: 20 mg/dL — AB
Leukocytes,Ua: NEGATIVE
Nitrite: NEGATIVE
Protein, ur: NEGATIVE mg/dL
Specific Gravity, Urine: 1.01 (ref 1.005–1.030)
pH: 7 (ref 5.0–8.0)

## 2019-04-19 LAB — WET PREP, GENITAL
Clue Cells Wet Prep HPF POC: NONE SEEN
Sperm: NONE SEEN
Trich, Wet Prep: NONE SEEN
Yeast Wet Prep HPF POC: NONE SEEN

## 2019-04-19 NOTE — Discharge Instructions (Signed)
Abdominal Pain During Pregnancy ° °Belly (abdominal) pain is common during pregnancy. There are many possible causes. Most of the time, it is not a serious problem. Other times, it can be a sign that something is wrong with the pregnancy. Always tell your doctor if you have belly pain. °Follow these instructions at home: °· Do not have sex or put anything in your vagina until your pain goes away completely. °· Get plenty of rest until your pain gets better. °· Drink enough fluid to keep your pee (urine) pale yellow. °· Take over-the-counter and prescription medicines only as told by your doctor. °· Keep all follow-up visits as told by your doctor. This is important. °Contact a doctor if: °· Your pain continues or gets worse after resting. °· You have lower belly pain that: °? Comes and goes at regular times. °? Spreads to your back. °? Feels like menstrual cramps. °· You have pain or burning when you pee (urinate). °Get help right away if: °· You have a fever or chills. °· You have vaginal bleeding. °· You are leaking fluid from your vagina. °· You are passing tissue from your vagina. °· You throw up (vomit) for more than 24 hours. °· You have watery poop (diarrhea) for more than 24 hours. °· Your baby is moving less than usual. °· You feel very weak or faint. °· You have shortness of breath. °· You have very bad pain in your upper belly. °Summary °· Belly (abdominal) pain is common during pregnancy. There are many possible causes. °· If you have belly pain during pregnancy, tell your doctor right away. °· Keep all follow-up visits as told by your doctor. This is important. °This information is not intended to replace advice given to you by your health care provider. Make sure you discuss any questions you have with your health care provider. °Document Released: 06/16/2009 Document Revised: 10/16/2018 Document Reviewed: 09/30/2016 °Elsevier Patient Education © 2020 [employer] Inc[employer] ° °

## 2019-04-19 NOTE — MAU Provider Note (Signed)
History     CSN: [id number]  Arrival date and time: 04/19/19 1128   First Provider Initiated Contact with Patient 04/19/19 1411      Chief Complaint  Patient presents with  . Abdominal Pain   [name] is a 33 y.o. [id number] at 7w1dwho receives care at [facility].  She presents today for Abdominal Pain that she describes as "an uncomfortable braxton hicks type pain." She states she was experiencing the same pain yesterday despite evaluation.  Patient states the pain comes every 20-25 minutes.  She rates the pain a 4-5/10.  She also reports back pain, but recognizes that this is a chronic issue.  She reports that she took tylenol for the pain, but has not taken anything today.  Patient goes on to state that she has not experienced much pain since arrival and reports increased water intake.  Patient goes on to state that she has been having 4-5 BH contractions every hour since the first trimester, but that they resolve with position change.  She describes braxton hicks contractions as "a tightening in my abdomen that gets real tight, but I can still move."      OB History    Gravida  6   Para  3   Term  3   Preterm  0   AB  2   Living  3     SAB  0   TAB  2   Ectopic  0   Multiple  0   Live Births  3           Past Medical History:  Diagnosis Date  . Acne   . Anemia   . Chlamydia   . Infection    UTI    Past Surgical History:  Procedure Laterality Date  . CESAREAN SECTION    . DILATION AND CURETTAGE OF UTERUS      Family History  Adopted: Yes  Problem Relation Age of Onset  . Other Neg Hx     Social History   Tobacco Use  . Smoking status: Never Smoker  . Smokeless tobacco: Never Used  Substance Use Topics  . Alcohol use: No  . Drug use: No    Allergies: No Known Allergies  Medications Prior to Admission  Medication Sig Dispense Refill Last Dose  . acetaminophen (TYLENOL) 500 MG tablet Take 1,000 mg by mouth every 6 (six) hours as  needed for moderate pain.   04/18/2019 at Unknown time  . Prenatal Vit-Fe Fumarate-FA (MULTIVITAMIN-PRENATAL) 27-0.8 MG TABS tablet Take 1 tablet by mouth daily at 12 noon. 30 tablet 11 04/18/2019 at Unknown time  . Blood Pressure Monitoring KIT 1 kit by Does not apply route once a week. 1 kit 0   . cyclobenzaprine (FLEXERIL) 5 MG tablet Take 10 mg by mouth as needed for muscle spasms.      .Marland Kitchendocusate sodium (COLACE) 100 MG capsule Take 1 capsule (100 mg total) by mouth 2 (two) times daily as needed. 30 capsule 2   . Elastic Bandages & Supports (COMFORT FIT MATERNITY SUPP MED) MISC 1 Device by Does not apply route daily. 1 each 0   . ferrous sulfate 325 (65 FE) MG tablet Take 1 tablet (325 mg total) by mouth daily. 100 tablet 1     Review of Systems  Constitutional: Negative for chills and fever.  Respiratory: Negative for cough and shortness of breath.   Gastrointestinal: Positive for abdominal pain and constipation. Negative for diarrhea, nausea and  vomiting.  Genitourinary: Negative for difficulty urinating, dysuria, pelvic pain, vaginal bleeding and vaginal discharge.  Neurological: Negative for dizziness, light-headedness and headaches.   Physical Exam   Blood pressure 119/70, pulse 89, temperature 98.2 F (36.8 C), temperature source Oral, resp. rate 17, height '5\' 4"'  (1.626 m), weight 104.3 kg, last menstrual period 12/13/2018, SpO2 99 %.  Physical Exam  Constitutional: She is oriented to person, place, and time. She appears well-developed and well-nourished.  HENT:  Head: Normocephalic and atraumatic.  Eyes: Conjunctivae are normal.  Neck: Normal range of motion.  Cardiovascular: Normal rate.  Respiratory: Effort normal.  Genitourinary:    Genitourinary Comments: Speculum Exam: -Normal External Genitalia: Non tender, no apparent discharge at introitus.  -Vaginal Vault: Pink mucosa with good rugae. Moderate amt creamy discharge in posterior fornix -wet prep collected -Cervix:Pink,  no lesions, cysts, or polyps.  Appears closed. No active bleeding from os- -Bimanual Exam: Dilation: Closed Effacement (%): Thick Exam by:: Milinda Cave, CNM    Musculoskeletal: Normal range of motion.  Neurological: She is alert and oriented to person, place, and time.  Skin: Skin is warm and dry.  Psychiatric: She has a normal mood and affect. Her behavior is normal.    MAU Course  Procedures Results for orders placed or performed during the hospital encounter of 04/19/19 (from the past 24 hour(s))  Urinalysis, Routine w reflex microscopic     Status: Abnormal   Collection Time: 04/19/19  1:11 PM  Result Value Ref Range   Color, Urine YELLOW YELLOW   APPearance CLEAR CLEAR   Specific Gravity, Urine 1.010 1.005 - 1.030   pH 7.0 5.0 - 8.0   Glucose, UA NEGATIVE NEGATIVE mg/dL   Hgb urine dipstick NEGATIVE NEGATIVE   Bilirubin Urine NEGATIVE NEGATIVE   Ketones, ur 20 (A) NEGATIVE mg/dL   Protein, ur NEGATIVE NEGATIVE mg/dL   Nitrite NEGATIVE NEGATIVE   Leukocytes,Ua NEGATIVE NEGATIVE  Wet prep, genital     Status: Abnormal   Collection Time: 04/19/19  2:21 PM   Specimen: Vaginal  Result Value Ref Range   Yeast Wet Prep HPF POC NONE SEEN NONE SEEN   Trich, Wet Prep NONE SEEN NONE SEEN   Clue Cells Wet Prep HPF POC NONE SEEN NONE SEEN   WBC, Wet Prep HPF POC MANY (A) NONE SEEN   Sperm NONE SEEN     MDM Pelvic Exam with Wet Prep Education and Reassurance Assessment and Plan  33 year old, [id number]  SIUP at 3.1weeks Abdominal Pain  -Reviewed POC with patient. -Exam performed and findings discussed.  -Extensive discussion regarding common pregnancy complaints and discomforts. -Encouraged to call office for concerns in which she questions if she needs evaluation.  -Patient verbalizes understanding and has no other questions or concerns.  -Offered and declines pain medication. -Informed that wet prep will be sent and if normal will discharge to home. -Encouraged to rest and  contact office with any further concerns and/or questions.  Nicole Conners MSN, CNM 04/19/2019, 2:11 PM   Reassessment (3:03 PM)  -Wet prep returns with insignificant findings. -Results discussed with patient. -Informed that GC/CT will return within 2-3 days. Encouraged to call if any questions or concerns arise prior to next scheduled office visit.  -Discharged to home in stable condition.   Nicole Conners MSN, CNM Advanced Practice Provider, Center for [facility]

## 2019-04-19 NOTE — MAU Note (Signed)
Pt reports she has had ctx since last night. Was seen last night and rulled out UTI and cervix was closed. Pain has continued since she went home a nd feels stronger. Denies any vag bleeding or discharge.

## 2019-04-19 NOTE — MAU Note (Signed)
Feels like Braxton Hicks type pain. Did not go away after leaving MAU yesterday. I've drank water, do not feel like I should still be having them.

## 2019-04-25 ENCOUNTER — Ambulatory Visit (HOSPITAL_COMMUNITY)
Admission: RE | Admit: 2019-04-25 | Discharge: 2019-04-25 | Disposition: A | Discharge status: Home / Self Care | Payer: Medicaid Other | Source: Ambulatory Visit | Attending: Women's Health | Admitting: Women's Health

## 2019-04-25 ENCOUNTER — Ambulatory Visit (HOSPITAL_COMMUNITY): Payer: Self-pay | Admitting: Genetic Counselor

## 2019-04-25 ENCOUNTER — Other Ambulatory Visit (HOSPITAL_COMMUNITY): Payer: Self-pay | Admitting: *Deleted

## 2019-04-25 ENCOUNTER — Other Ambulatory Visit: Payer: Self-pay

## 2019-04-25 ENCOUNTER — Ambulatory Visit (HOSPITAL_BASED_OUTPATIENT_CLINIC_OR_DEPARTMENT_OTHER): Payer: Medicaid Other | Admitting: Genetic Counselor

## 2019-04-25 DIAGNOSIS — O418X10 Other specified disorders of amniotic fluid and membranes, first trimester, not applicable or unspecified: Secondary | ICD-10-CM | POA: Diagnosis present

## 2019-04-25 DIAGNOSIS — O468X1 Other antepartum hemorrhage, first trimester: Secondary | ICD-10-CM | POA: Diagnosis present

## 2019-04-25 DIAGNOSIS — Z3A20 20 weeks gestation of pregnancy: Secondary | ICD-10-CM

## 2019-04-25 DIAGNOSIS — O34219 Maternal care for unspecified type scar from previous cesarean delivery: Secondary | ICD-10-CM

## 2019-04-25 DIAGNOSIS — Z315 Encounter for genetic counseling: Secondary | ICD-10-CM | POA: Diagnosis not present

## 2019-04-25 DIAGNOSIS — O99212 Obesity complicating pregnancy, second trimester: Secondary | ICD-10-CM | POA: Diagnosis not present

## 2019-04-25 DIAGNOSIS — D563 Thalassemia minor: Secondary | ICD-10-CM | POA: Insufficient documentation

## 2019-04-25 DIAGNOSIS — Z3480 Encounter for supervision of other normal pregnancy, unspecified trimester: Secondary | ICD-10-CM | POA: Diagnosis not present

## 2019-04-25 DIAGNOSIS — Z362 Encounter for other antenatal screening follow-up: Secondary | ICD-10-CM

## 2019-04-25 NOTE — Progress Notes (Signed)
04/25/2019  [name] Jun 02, 1986 MRN: [id number] DOV: 04/25/2019  [name] presented to the [facility] for a genetics consultation regarding her carrier status for alpha-thalassemia. [name] came to her appointment alone due to COVID-19 visitor restrictions.   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 is a [name], 33 y.o. female. Her current pregnancy has completed [redacted]w[redacted]d(Estimated Date of Delivery: 09/19/19).  [name] exposure to environmental toxins or chemical agents. She denied the use of alcohol, tobacco or street drugs. She reported taking prenatal vitamins and iron supplements. She denied significant viral illnesses and fevers. She reported experiencing a subchorionic hemorrhage around 10-11 weeks' gestation, with bleeding resolving approximately 3 weeks ago. Her medical and surgical histories were noncontributory.  Family History  A three generation pedigree was drafted and reviewed. The family history is remarkable for the following:  - [name], her two full sisters, and her full brother were adopted by the same family. [name] two other maternal half sisters who were adopted into the same family together, but grew up in a different family than [name] and her full siblings. They are all still in close communication with one another.    - [name] a sister and a paternal first cousin with lupus. We discussed that this is one condition in the family of conditions known as autoimmune conditions. Autoimmune conditions occur when an individual's body launches an abnormal immune response and begins to destroy its own cells. There are several different autoimmune conditions. While we do know that autoimmune conditions tend to "cluster" within a family, they do not follow a clear pattern of inheritance. When there is a person in the family with an  autoimmune condition, there is an increased chance for others in the family to develop an autoimmune condition, but it may be a different condition. Specific risk factor information is not available. Genetic testing is not available at this time to predict who may develop an autoimmune condition.  - [name] a sister with a learning disability. [name] beginning to recognize signs of a similar type of learning disability in her 852year old daughter. We discussed that many times, learning difficulties are multifactorial in nature, occurring due to a combination of genetic and environmental factors that are difficult to identify. Learning disabilities can appear to run in families; thus, there is a chance that [name]'s children could experience learning difficulties of some kind. [name] that she should make the pediatrician aware of any concerns she has about her children's development.  - [name] two maternal half sisters who have anemia. We discussed that some individuals can have a form of anemia called iron deficiency anemia, but that it is also possible that her sisters could be carriers for alpha-thalassemia if [name] inherited her genetic change from her mother. If [name]'s sisters are in fact carriers of alpha-thalassemia, they may experience mild anemia. [name] encouraged to discuss her carrier status with her sister and recommend that her sister be screened for alpha-thalassemia either during the preconception period or during pregnancy. See Discussion section for more details.  - [name]'s husband EHorton Finerhas a paternal first cousin with sickle cell disease. We discussed that Ms.Edwardshad carrier screening performed for several conditions, including hemoglobinopathies such as sickle cell disease. Her carrier screening did not identify her as a carrier of a hemoglobinopathy.Thus, her  risk of having a child with sickle cell disease is  significantly reduced.See Discussion section for further information about carrier screening results  The remaining family histories were reviewed and found to be noncontributory for birth defects, intellectual disability, recurrent pregnancy loss, and known genetic conditions. [name] had limited information about her extended family history given that she was adopted. She also had limited information about her husband's maternal family history. For these reasons, risk assessment was limited.   The patient's ethnicity is African American. The father of the pregnancy's ethnicity is African American. Consanguinity was denied. Ms. Hand was unsure of whether she has any Ashkenazi Jewish ancestry. Pedigree will be scanned under Media.  Discussion  [name] had Horizon 14 carrier screening performed through [employer]. The results of the screen identified her as a silent carrier for alpha-thalassemia (aa/a-). Alpha-thalassemia is different in its inheritance compared to other hemoglobinopathies as there are two alpha globin genes on each chromosome 16, or four alpha globin genes total (aa/aa). A person can be a carrier of one alpha gene mutation (aa/a-), also referred to as a "silent carrier". A person who carries two alpha globin gene mutations can either carry them in cis (both on the same chromosome, denoted as aa/--) or in trans (on different chromosomes, denoted as a-/a-). Alpha-thalassemia carriers of two mutations who have African American ancestry are more likely to have a trans arrangement (a-/a-); cis configuration is reported to be rare in individuals with African American ancestry.     There are several different forms of alpha-thalassemia. The most severe form of alpha-thalassemia, Hb Barts, is associated with an absence of alpha globin chain synthesis as a result of deletions of all four alpha globin genes (--/--).  Given that [name] is a silent carrier (aa/a-), her pregnancies would  not be at increased risk for Hb Barts, even if her partner is a carrier for alpha-thalassemia. Hemoglobin H (HbH) disease is caused by three deleted or dysfunctioning alpha globin alleles (a-/--) and is characterized by microcytic hypochromic hemolytic anemia, hepatosplenomegaly, mild jaundice, and sometimes thalassemia-like bone changes. Given [name]'s silent carrier status (aa/a-), the current fetus would only be at risk for HbH disease (a-/--), if her partner is a carrier for two alpha globin mutations in cis (aa/--). If this is the case, the risk for HbH disease in the pregnancy would be 1 in 4 (25%). However, if [name]'s partner is a carrier for two alpha globin mutations, he would be more likely to carry them in trans configuration (a-/a-) than the cis configuration (aa/--), given his ethnicity. If he is a carrier of alpha-thalassemia in trans, then the pregnancy would not be at increased risk for HbH disease. Based on the carrier frequency for alpha-thalassemia in the African American population, [name]'s partner has a 1 in 30 chance of being any type of carrier for alpha-thalassemia.   [name] carrier screening was negative for the other 13 conditions screened. Thus, her risk to be a carrier for these additional conditions (listed separately in the laboratory report) has been reduced but not eliminated. We discussed that carrier testing for alpha-thalassemia is recommended for [name]'s partner. [name] indicated that she is interested in pursuing partner carrier screening.  We also reviewed that [name] had Panorama NIPS through [facility] that was low-risk for fetal aneuploidies. We reviewed that these results showed a less than 1 in 10,000 risk for trisomies 21, 18 and 13, and monosomy X (Turner syndrome).  In addition,  the risk for triploidy and sex chromosome trisomies (47,XXX and 47,XXY) was also low. [name] elected to have cffDNA analysis for 22q11 deletion  syndrome, which was also low risk (1 in 9000). We reviewed that while this testing identifies > 99% of pregnancies with trisomy 38, trisomy 74, sex chromosome trisomies (47,XXX and 47,XXY), and triploidy, it is NOT diagnostic. A positive test result requires confirmation by CVS or amniocentesis, and a negative test result does not rule out a fetal chromosome abnormality. She also understands that this testing does not identify all genetic conditions.  A complete ultrasound was performed today following our visit. The ultrasound report will be sent under separate cover. There were no visualized fetal anomalies or markers suggestive of aneuploidy.   [name] was also counseled regarding diagnostic testing via amniocentesis. We discussed the technical aspects of the procedure and quoted up to a 1 in 500 (0.2%) risk for spontaneous pregnancy loss or other adverse pregnancy outcomes as a result of amniocentesis. Cultured cells from an amniocentesis sample allow for the visualization of a fetal karyotype, which can detect >99% of chromosomal aberrations. Chromosomal microarray can also be performed to identify smaller deletions or duplications of fetal chromosomal material. Amniocentesis could also be performed to assess whether the baby is affected by alpha-thalassemia. After careful consideration, [name] declined amniocentesis at this time. She understands that amniocentesis is available at any point after 16 weeks of pregnancy and that she may opt to undergo the procedure at a later date should she change her mind.  [name] was interested in pursuing partner carrier screening for alpha-thalassemia. I instructed Ms.[place]iceboro email me a copy of her partner's insurance card when she got home. From there, I will place the orderfor carrier screeningthrough the laboratory [facility] and have the laboratory mail a saliva kit to the couple's house. Results will take approximately2-3 weeksto be returned  from the time the sample is received at the laboratory. I will call Ms.Edwardswhen results become available.  I counseled Ms. Ausmus regarding the above risks and available options. The approximate face-to-face time with the genetic counselor was 25 minutes.  In summary:  Discussed carrier screening results and options for follow-up testing  Silent carrier for alpha-thalassemia  Desires partner carrier screening. She will contact me with partner's insurance information and I will facilitate testing from there  Reviewed low-risk NIPS results  Reduction in risk for Down syndrome, Trisomy 31, Trisomy 81, sex chromosome aneuploidies, and 22q11 deletion syndrome  Reviewed results of ultrasound  No fetal anomalies or markers seen  Reduction in risk for fetal aneuploidy  Offered additional testing and screening  Declined amniocentesis  Reviewed family history concerns   Buelah Manis, [place]

## 2019-05-07 ENCOUNTER — Telehealth: Payer: Self-pay

## 2019-05-07 NOTE — Telephone Encounter (Signed)
Returned call pt had questions regarding if it is safe to relax hair while pregnant

## 2019-05-13 ENCOUNTER — Encounter (HOSPITAL_COMMUNITY): Payer: Self-pay | Admitting: *Deleted

## 2019-05-13 ENCOUNTER — Inpatient Hospital Stay (HOSPITAL_COMMUNITY)
Admission: AD | Admit: 2019-05-13 | Discharge: 2019-05-13 | Disposition: A | Discharge status: Home / Self Care | Payer: Medicaid Other | Attending: Obstetrics & Gynecology | Admitting: Obstetrics & Gynecology

## 2019-05-13 ENCOUNTER — Other Ambulatory Visit: Payer: Self-pay

## 2019-05-13 DIAGNOSIS — M7918 Myalgia, other site: Secondary | ICD-10-CM | POA: Diagnosis not present

## 2019-05-13 DIAGNOSIS — D649 Anemia, unspecified: Secondary | ICD-10-CM | POA: Diagnosis not present

## 2019-05-13 DIAGNOSIS — R1010 Upper abdominal pain, unspecified: Secondary | ICD-10-CM | POA: Insufficient documentation

## 2019-05-13 DIAGNOSIS — Z3A21 21 weeks gestation of pregnancy: Secondary | ICD-10-CM | POA: Diagnosis not present

## 2019-05-13 DIAGNOSIS — O26891 Other specified pregnancy related conditions, first trimester: Secondary | ICD-10-CM

## 2019-05-13 DIAGNOSIS — O99012 Anemia complicating pregnancy, second trimester: Secondary | ICD-10-CM | POA: Insufficient documentation

## 2019-05-13 DIAGNOSIS — O26892 Other specified pregnancy related conditions, second trimester: Secondary | ICD-10-CM | POA: Diagnosis present

## 2019-05-13 LAB — URINALYSIS, ROUTINE W REFLEX MICROSCOPIC
Bilirubin Urine: NEGATIVE
Glucose, UA: NEGATIVE mg/dL
Hgb urine dipstick: NEGATIVE
Ketones, ur: 20 mg/dL — AB
Leukocytes,Ua: NEGATIVE
Nitrite: NEGATIVE
Protein, ur: NEGATIVE mg/dL
Specific Gravity, Urine: 1.019 (ref 1.005–1.030)
pH: 6 (ref 5.0–8.0)

## 2019-05-13 NOTE — MAU Note (Signed)
[name] is a 33 y.o. at [redacted]w[redacted]d here in MAU reporting: upper abdominal pain since yesterday, states it is very sore. It feels like she did 50 pushups. No bleeding or abnormal discharge.  Onset of complaint: yesterday  Pain score: 5/10  Vitals:   05/13/19 1135  BP: 117/71  Pulse: 85  Resp: 16  Temp: 98.2 F (36.8 C)  SpO2: 100%     FHT:145   Lab orders placed from triage: UA

## 2019-05-13 NOTE — Discharge Instructions (Signed)
Get an exercise ball and spend 3-4 minutes at a time on the ball to strengthen the abdominal core muscles. Increase the time gradually on the ball to 10-15 minutes at a time.

## 2019-05-13 NOTE — MAU Provider Note (Signed)
History     CSN: [id number]  Arrival date and time: 05/13/19 1057   First Provider Initiated Contact with Patient 05/13/19 1232      Chief Complaint  Patient presents with  . Abdominal Pain   HPI [name] 33 y.o. [redacted]w[redacted]d  Comes to MAU today with upper abdominal soreness and soreness in her sides that feels like she has done 50 situps.  Is not having vaginal bleeding.  Is not having cramping today but has had cramping periodically over the last few weeks.  Is wondering if she is having gall bladder problems.  Is feeling the baby move very well.  Has used Tylenol for pain last night and slept well last night.  Did not take Tylenol again this morning.  Does not know why she is having this pain that feels like soreness.  OB History    Gravida  6   Para  3   Term  3   Preterm  0   AB  2   Living  3     SAB  0   TAB  2   Ectopic  0   Multiple  0   Live Births  3           Past Medical History:  Diagnosis Date  . Acne   . Anemia   . Chlamydia   . Infection    UTI    Past Surgical History:  Procedure Laterality Date  . CESAREAN SECTION    . DILATION AND CURETTAGE OF UTERUS      Family History  Adopted: Yes  Problem Relation Age of Onset  . Other Neg Hx     Social History   Tobacco Use  . Smoking status: Never Smoker  . Smokeless tobacco: Never Used  Substance Use Topics  . Alcohol use: No  . Drug use: No    Allergies: No Known Allergies  No medications prior to admission.    Review of Systems  Constitutional: Negative for fever.  Respiratory: Negative for cough and shortness of breath.   Gastrointestinal: Negative for diarrhea, nausea and vomiting.       Upper abdominal "soreness" and "soreness" in her sides bilaterally  Genitourinary: Negative for dysuria, vaginal bleeding and vaginal discharge.  Musculoskeletal: Negative for back pain.   Physical Exam   Blood pressure 113/67, pulse 81, temperature 98.2 F (36.8 C),  temperature source Oral, resp. rate 16, height 5\' 4"  (1.626 m), weight 102.9 kg, last menstrual period 12/13/2018, SpO2 100 %.  Physical Exam  Nursing note and vitals reviewed. Constitutional: She is oriented to person, place, and time. She appears well-developed and well-nourished.  HENT:  Head: Normocephalic.  Eyes: EOM are normal.  Neck: Neck supple.  GI: Soft. There is no rebound and no guarding.  Some mild tenderness in the "sore" area just above the uterine fundus.  Musculoskeletal: Normal range of motion.  Neurological: She is alert and oriented to person, place, and time.  Skin: Skin is warm and dry.  Psychiatric: She has a normal mood and affect.    MAU Course  Procedures Labs Results for orders placed or performed during the hospital encounter of 05/13/19 (from the past 24 hour(s))  Urinalysis, Routine w reflex microscopic     Status: Abnormal   Collection Time: 05/13/19  1:21 PM  Result Value Ref Range   Color, Urine YELLOW YELLOW   APPearance HAZY (A) CLEAR   Specific Gravity, Urine 1.019 1.005 - 1.030   pH  6.0 5.0 - 8.0   Glucose, UA NEGATIVE NEGATIVE mg/dL   Hgb urine dipstick NEGATIVE NEGATIVE   Bilirubin Urine NEGATIVE NEGATIVE   Ketones, ur 20 (A) NEGATIVE mg/dL   Protein, ur NEGATIVE NEGATIVE mg/dL   Nitrite NEGATIVE NEGATIVE   Leukocytes,Ua NEGATIVE NEGATIVE    MDM Discussed soreness could be from any sweeping done yesterday - repetitive motion movements. -Reviewed POC with patient. -Exam performed and findings discussed.  -Extensive discussion regarding common pregnancy complaints and discomforts. -Advised getting an exercise ball to sit on to strengthen her abdominal core. _Reviewed that her pain is no indicative of gall bladder problems and that further labs or ultrasounds are not indicated today. -Patient verbalizes understanding and has no other questions or concerns.  -Informed that labs will be sent and we will contact her after discharge if  treatment is indicated. -Encouraged to rest and contact office with any further concerns and/or questions.     Assessment and Plan  Musculoskeletal pain  Plan Get an exercise ball and spend 3-4 minutes at a time on the ball to strengthen the abdominal core muscles. Increase the time gradually on the ball to 10-15 minutes at a time. Keep your scheduled appointment in the office on Wednesday this week. Reviewed EDC and determined that most appropriate EDC would be based on her ultrasound done on 02-05-19. Drink at least 8 8-oz glasses of water every day. Take Tylenol 325 mg 2 tablets by mouth every 4 hours if needed for pain.   Terri L Burleson 05/13/2019, 1:58 PM

## 2019-05-16 ENCOUNTER — Encounter: Payer: Self-pay | Admitting: Obstetrics and Gynecology

## 2019-05-16 ENCOUNTER — Telehealth (INDEPENDENT_AMBULATORY_CARE_PROVIDER_SITE_OTHER): Payer: Medicaid Other | Admitting: Obstetrics and Gynecology

## 2019-05-16 VITALS — BP 117/80 | HR 79

## 2019-05-16 DIAGNOSIS — Z3A22 22 weeks gestation of pregnancy: Secondary | ICD-10-CM

## 2019-05-16 DIAGNOSIS — A6009 Herpesviral infection of other urogenital tract: Secondary | ICD-10-CM

## 2019-05-16 DIAGNOSIS — E669 Obesity, unspecified: Secondary | ICD-10-CM

## 2019-05-16 DIAGNOSIS — O99212 Obesity complicating pregnancy, second trimester: Secondary | ICD-10-CM

## 2019-05-16 DIAGNOSIS — Z3480 Encounter for supervision of other normal pregnancy, unspecified trimester: Secondary | ICD-10-CM

## 2019-05-16 DIAGNOSIS — Z98891 History of uterine scar from previous surgery: Secondary | ICD-10-CM

## 2019-05-16 DIAGNOSIS — R8271 Bacteriuria: Secondary | ICD-10-CM

## 2019-05-16 DIAGNOSIS — O98312 Other infections with a predominantly sexual mode of transmission complicating pregnancy, second trimester: Secondary | ICD-10-CM

## 2019-05-16 NOTE — Progress Notes (Signed)
   [facility] VIRTUAL VIDEO VISIT ENCOUNTER NOTE  Provider location: [facility] [place]   I connected with Abelardo Diesel on 05/16/19 at  2:30 PM EST by MyChart Video Encounter at home and verified that I am speaking with the correct person using two identifiers.   I discussed the limitations, risks, security and privacy concerns of performing an evaluation and management service virtually and the availability of in person appointments. I also discussed with the patient that there may be a patient responsible charge related to this service. The patient expressed understanding and agreed to proceed. Subjective:  [name] is a 33 y.o. [id number] at [redacted]w[redacted]d being seen today for ongoing prenatal care.  She is currently monitored for the following issues for this high-risk pregnancy and has ASTHMA; GBS bacteriuria; H/O cesarean section; Supervision of other normal pregnancy, antepartum; Adopted; Obesity (BMI 30-39.9); Genital herpes affecting pregnancy; Anemia affecting pregnancy; and Alpha thalassemia silent carrier on their problem list.  Patient reports cramping.  Contractions: Not present. Vag. Bleeding: None.  Movement: Present. Denies any leaking of fluid.   The following portions of the patient's history were reviewed and updated as appropriate: allergies, current medications, past family history, past medical history, past social history, past surgical history and problem list.   Objective:   Vitals:   05/16/19 1431  BP: 117/80  Pulse: 79    Fetal Status:     Movement: Present     General:  Alert, oriented and cooperative. Patient is in no acute distress.  Respiratory: Normal respiratory effort, no problems with respiration noted  Mental Status: Normal mood and affect. Normal behavior. Normal judgment and thought content.  Rest of physical exam deferred due to type of encounter  Imaging:  Assessment and Plan:  Pregnancy: [id number] at [redacted]w[redacted]d   1. Supervision of other normal pregnancy, antepartum Encouraged increased hydration  2. Obesity (BMI 30-39.9)  3. Genital herpes affecting pregnancy in second trimester ppx 34-36 weeks  4. GBS bacteriuria  5. H/O cesarean section RCS scheduled today Pt expressed an interest in TOLAC, reviewed risks and would not recommend  Preterm labor symptoms and general obstetric precautions including but not limited to vaginal bleeding, contractions, leaking of fluid and fetal movement were reviewed in detail with the patient. I discussed the assessment and treatment plan with the patient. The patient was provided an opportunity to ask questions and all were answered. The patient agreed with the plan and demonstrated an understanding of the instructions. The patient was advised to call back or seek an in-person office evaluation/go to [facility]U at [facility] for any urgent or concerning symptoms. Please refer to After Visit Summary for other counseling recommendations.   I provided 15 minutes of face-to-face time during this encounter.  Return in about 4 weeks (around 06/13/2019) for in person, 2 hr GTT, 3rd trim labs, high OB.  Future Appointments  Date Time Provider [place]  05/23/2019  8:15 AM [facility] [place], [place] for [place], [place]

## 2019-05-23 ENCOUNTER — Other Ambulatory Visit: Payer: Self-pay

## 2019-05-23 ENCOUNTER — Ambulatory Visit (HOSPITAL_COMMUNITY)
Admission: RE | Admit: 2019-05-23 | Discharge: 2019-05-23 | Disposition: A | Discharge status: Home / Self Care | Payer: Medicaid Other | Source: Ambulatory Visit | Attending: Maternal & Fetal Medicine | Admitting: Maternal & Fetal Medicine

## 2019-05-23 DIAGNOSIS — O99212 Obesity complicating pregnancy, second trimester: Secondary | ICD-10-CM

## 2019-05-23 DIAGNOSIS — Z3A24 24 weeks gestation of pregnancy: Secondary | ICD-10-CM | POA: Diagnosis not present

## 2019-05-23 DIAGNOSIS — O34219 Maternal care for unspecified type scar from previous cesarean delivery: Secondary | ICD-10-CM

## 2019-05-23 DIAGNOSIS — Z362 Encounter for other antenatal screening follow-up: Secondary | ICD-10-CM | POA: Diagnosis present

## 2019-06-13 ENCOUNTER — Other Ambulatory Visit: Payer: Medicaid Other

## 2019-06-13 ENCOUNTER — Ambulatory Visit (INDEPENDENT_AMBULATORY_CARE_PROVIDER_SITE_OTHER): Payer: Medicaid Other | Admitting: Obstetrics and Gynecology

## 2019-06-13 ENCOUNTER — Encounter: Payer: Self-pay | Admitting: Obstetrics and Gynecology

## 2019-06-13 ENCOUNTER — Other Ambulatory Visit: Payer: Self-pay

## 2019-06-13 VITALS — BP 109/71 | HR 87 | Wt 234.1 lb

## 2019-06-13 DIAGNOSIS — O98313 Other infections with a predominantly sexual mode of transmission complicating pregnancy, third trimester: Secondary | ICD-10-CM

## 2019-06-13 DIAGNOSIS — Z3A36 36 weeks gestation of pregnancy: Secondary | ICD-10-CM

## 2019-06-13 DIAGNOSIS — A6009 Herpesviral infection of other urogenital tract: Secondary | ICD-10-CM

## 2019-06-13 DIAGNOSIS — Z98891 History of uterine scar from previous surgery: Secondary | ICD-10-CM

## 2019-06-13 DIAGNOSIS — Z3480 Encounter for supervision of other normal pregnancy, unspecified trimester: Secondary | ICD-10-CM

## 2019-06-13 DIAGNOSIS — E669 Obesity, unspecified: Secondary | ICD-10-CM

## 2019-06-13 DIAGNOSIS — O99213 Obesity complicating pregnancy, third trimester: Secondary | ICD-10-CM

## 2019-06-13 NOTE — Progress Notes (Signed)
   PRENATAL VISIT NOTE  Subjective:  [name] is a 33 y.o. [phone] at [redacted]w[redacted]d being seen today for ongoing prenatal care.  She is currently monitored for the following issues for this low-risk pregnancy and has ASTHMA; GBS bacteriuria; H/O cesarean section; Supervision of other normal pregnancy, antepartum; Adopted; Obesity (BMI 30-39.9); Genital herpes affecting pregnancy; Anemia affecting pregnancy; and Alpha thalassemia silent carrier on their problem list.  Patient reports no complaints.  Contractions: Not present. Vag. Bleeding: None.  Movement: Present. Denies leaking of fluid.   The following portions of the patient's history were reviewed and updated as appropriate: allergies, current medications, past family history, past medical history, past social history, past surgical history and problem list.   Objective:   Vitals:   06/13/19 0835  BP: 109/71  Pulse: 87  Weight: 234 lb 1.6 oz (106.2 kg)    Fetal Status: Fetal Heart Rate (bpm): 140 Fundal Height: 26 cm Movement: Present     General:  Alert, oriented and cooperative. Patient is in no acute distress.  Skin: Skin is warm and dry. No rash noted.   Cardiovascular: Normal heart rate noted  Respiratory: Normal respiratory effort, no problems with respiration noted  Abdomen: Soft, gravid, appropriate for gestational age.  Pain/Pressure: Absent     Pelvic: Cervical exam deferred        Extremities: Normal range of motion.  Edema: None  Mental Status: Normal mood and affect. Normal behavior. Normal judgment and thought content.   Assessment and Plan:  Pregnancy: [id number] at [redacted]w[redacted]d 1. Supervision of other normal pregnancy, antepartum Patient is doing well without complaints Third trimester labs today - 2 Hour GTT - CBC - RPR - HIV antibody  2. H/O cesarean section Patient will be scheduled for repeat at 39 weeks Patient does not want contraception  3. Genital herpes affecting pregnancy in third trimester Prophylaxis  at 34 weeks  4. Obesity (BMI 30-39.9)   Preterm labor symptoms and general obstetric precautions including but not limited to vaginal bleeding, contractions, leaking of fluid and fetal movement were reviewed in detail with the patient. Please refer to After Visit Summary for other counseling recommendations.   Return in about 2 weeks (around 06/27/2019) for Virtual, ROB, Low risk.  No future appointments.  Mora Bellman, MD

## 2019-06-13 NOTE — Progress Notes (Signed)
Pt is here for ROB and 2hrGTT. [redacted]w[redacted]d.

## 2019-06-14 LAB — GLUCOSE TOLERANCE, 2 HOURS W/ 1HR
Glucose, 1 hour: 117 mg/dL (ref 65–179)
Glucose, 2 hour: 94 mg/dL (ref 65–152)
Glucose, Fasting: 81 mg/dL (ref 65–91)

## 2019-06-14 LAB — CBC
Hematocrit: 32.5 % — ABNORMAL LOW (ref 34.0–46.6)
Hemoglobin: 10.4 g/dL — ABNORMAL LOW (ref 11.1–15.9)
MCH: 28.7 pg (ref 26.6–33.0)
MCHC: 32 g/dL (ref 31.5–35.7)
MCV: 90 fL (ref 79–97)
Platelets: 281 10*3/uL (ref 150–450)
RBC: 3.62 x10E6/uL — ABNORMAL LOW (ref 3.77–5.28)
RDW: 14 % (ref 11.7–15.4)
WBC: 6.4 10*3/uL (ref 3.4–10.8)

## 2019-06-14 LAB — RPR: RPR Ser Ql: NONREACTIVE

## 2019-06-14 LAB — HIV ANTIBODY (ROUTINE TESTING W REFLEX): HIV Screen 4th Generation wRfx: NONREACTIVE

## 2019-06-26 ENCOUNTER — Inpatient Hospital Stay (HOSPITAL_COMMUNITY)
Admission: AD | Admit: 2019-06-26 | Discharge: 2019-06-26 | Disposition: A | Discharge status: Home / Self Care | Payer: Medicaid Other | Attending: Obstetrics & Gynecology | Admitting: Obstetrics & Gynecology

## 2019-06-26 ENCOUNTER — Encounter (HOSPITAL_COMMUNITY): Payer: Self-pay | Admitting: Obstetrics & Gynecology

## 2019-06-26 ENCOUNTER — Inpatient Hospital Stay (HOSPITAL_COMMUNITY): Payer: Medicaid Other

## 2019-06-26 ENCOUNTER — Other Ambulatory Visit: Payer: Self-pay

## 2019-06-26 DIAGNOSIS — O99282 Endocrine, nutritional and metabolic diseases complicating pregnancy, second trimester: Secondary | ICD-10-CM | POA: Insufficient documentation

## 2019-06-26 DIAGNOSIS — O99283 Endocrine, nutritional and metabolic diseases complicating pregnancy, third trimester: Secondary | ICD-10-CM | POA: Diagnosis present

## 2019-06-26 DIAGNOSIS — O99012 Anemia complicating pregnancy, second trimester: Secondary | ICD-10-CM | POA: Insufficient documentation

## 2019-06-26 DIAGNOSIS — E876 Hypokalemia: Secondary | ICD-10-CM | POA: Insufficient documentation

## 2019-06-26 DIAGNOSIS — O34219 Maternal care for unspecified type scar from previous cesarean delivery: Secondary | ICD-10-CM | POA: Diagnosis not present

## 2019-06-26 DIAGNOSIS — O98312 Other infections with a predominantly sexual mode of transmission complicating pregnancy, second trimester: Secondary | ICD-10-CM

## 2019-06-26 DIAGNOSIS — A6009 Herpesviral infection of other urogenital tract: Secondary | ICD-10-CM

## 2019-06-26 DIAGNOSIS — D649 Anemia, unspecified: Secondary | ICD-10-CM | POA: Diagnosis not present

## 2019-06-26 DIAGNOSIS — E669 Obesity, unspecified: Secondary | ICD-10-CM

## 2019-06-26 DIAGNOSIS — Z3A27 27 weeks gestation of pregnancy: Secondary | ICD-10-CM

## 2019-06-26 DIAGNOSIS — R109 Unspecified abdominal pain: Secondary | ICD-10-CM

## 2019-06-26 DIAGNOSIS — R8271 Bacteriuria: Secondary | ICD-10-CM

## 2019-06-26 DIAGNOSIS — Z3689 Encounter for other specified antenatal screening: Secondary | ICD-10-CM

## 2019-06-26 DIAGNOSIS — Z0282 Encounter for adoption services: Secondary | ICD-10-CM

## 2019-06-26 DIAGNOSIS — Z79899 Other long term (current) drug therapy: Secondary | ICD-10-CM | POA: Insufficient documentation

## 2019-06-26 DIAGNOSIS — R1011 Right upper quadrant pain: Secondary | ICD-10-CM

## 2019-06-26 LAB — COMPREHENSIVE METABOLIC PANEL
ALT: 12 U/L (ref 0–44)
AST: 13 U/L — ABNORMAL LOW (ref 15–41)
Albumin: 2.7 g/dL — ABNORMAL LOW (ref 3.5–5.0)
Alkaline Phosphatase: 70 U/L (ref 38–126)
Anion gap: 8 (ref 5–15)
BUN: <5 mg/dL — ABNORMAL LOW (ref 6–20)
CO2: 22 mmol/L (ref 22–32)
Calcium: 9 mg/dL (ref 8.9–10.3)
Chloride: 104 mmol/L (ref 98–111)
Creatinine, Ser: 0.56 mg/dL (ref 0.44–1.00)
GFR calc Af Amer: >60 mL/min (ref >60)
GFR calc non Af Amer: >60 mL/min (ref >60)
Glucose, Bld: 79 mg/dL (ref 70–99)
Potassium: 3 mmol/L — ABNORMAL LOW (ref 3.5–5.1)
Sodium: 134 mmol/L — ABNORMAL LOW (ref 135–145)
Total Bilirubin: 0.4 mg/dL (ref 0.3–1.2)
Total Protein: 6.7 g/dL (ref 6.5–8.1)

## 2019-06-26 LAB — URINALYSIS, ROUTINE W REFLEX MICROSCOPIC
Bilirubin Urine: NEGATIVE
Glucose, UA: NEGATIVE mg/dL
Hgb urine dipstick: NEGATIVE
Ketones, ur: NEGATIVE mg/dL
Leukocytes,Ua: NEGATIVE
Nitrite: NEGATIVE
Protein, ur: NEGATIVE mg/dL
Specific Gravity, Urine: 1.005 (ref 1.005–1.030)
pH: 7 (ref 5.0–8.0)

## 2019-06-26 LAB — CBC
HCT: 31.6 % — ABNORMAL LOW (ref 36.0–46.0)
Hemoglobin: 10.7 g/dL — ABNORMAL LOW (ref 12.0–15.0)
MCH: 29.6 pg (ref 26.0–34.0)
MCHC: 33.9 g/dL (ref 30.0–36.0)
MCV: 87.5 fL (ref 80.0–100.0)
Platelets: 276 10*3/uL (ref 150–400)
RBC: 3.61 MIL/uL — ABNORMAL LOW (ref 3.87–5.11)
RDW: 14.6 % (ref 11.5–15.5)
WBC: 7.7 10*3/uL (ref 4.0–10.5)
nRBC: 0 % (ref 0.0–0.2)

## 2019-06-26 MED ORDER — POTASSIUM CHLORIDE CRYS ER 20 MEQ PO TBCR: 20.0000 meq | EXTENDED_RELEASE_TABLET | Freq: Two times a day (BID) | ORAL | 1 refills | Status: DC

## 2019-06-26 NOTE — MAU Note (Signed)
PT SAYS SHE FEELS SHARP PAIN ON RIGHT SIDE UNDER ARM- RADIATES TO UPPER ABD - STARTED AT 7 - WAS LAYING DOWN- AND STARTED PAIN.   HAS HAPPENED BEFORE- BUT THIS BAD .  PNC WITH [facility]  HAS A VIRTUAL APPOINTMENT TOMORROW .

## 2019-06-26 NOTE — MAU Provider Note (Signed)
History     CSN: [id number]  Arrival date and time: 06/26/19 1946   First Provider Initiated Contact with Patient 06/26/19 2045      No chief complaint on file.  [name] is a 33 y.o. [id number] at 60w6dwho [facility]  She presents today for right side abdominal pain that started about 7pm.  She states the pain is "sharp and had me hurled over."  Patient states the excruciating and goes on to report it was "a sharp burning pain, but then it just went away."  Patient states the pain lasted about 10 minutes and then resolved.  She states she doesn't get the pain often, but when it occurs it makes the area "tender to the touch."  Patient states she assumes it "is round ligament or a baby on the nerve."  Patient states that this time "it was at an extreme that it never was before."  She states she takes tylenol, "but it doesn't last long enough for me to take and I usually take tylenol for mild discomfort and cramps."  Patient endorses fetal movement and reports she felt movement while experiencing the pain.  She denies contractions, discharge, bleeding, or leaking.  She goes on to state that "I have had the normal vaginal discharge and some braxton hicks that are "here and there."    Bagel and Cream Cheese Cheese and Crackers prior to experiencing the pain "couple bottles of water and a cup of juice...maybe 4 or 5 bottles of water."     OB History    Gravida  6   Para  3   Term  3   Preterm  0   AB  2   Living  3     SAB  0   TAB  2   Ectopic  0   Multiple  0   Live Births  3           Past Medical History:  Diagnosis Date  . Acne   . Anemia   . Chlamydia   . Infection    UTI    Past Surgical History:  Procedure Laterality Date  . CESAREAN SECTION    . DILATION AND CURETTAGE OF UTERUS      Family History  Adopted: Yes  Problem Relation Age of Onset  . Other Neg Hx     Social History   Tobacco Use  . Smoking status: Never  Smoker  . Smokeless tobacco: Never Used  Substance Use Topics  . Alcohol use: No  . Drug use: No    Allergies: No Known Allergies  Medications Prior to Admission  Medication Sig Dispense Refill Last Dose  . acetaminophen (TYLENOL) 500 MG tablet Take 1,000 mg by mouth every 6 (six) hours as needed for moderate pain.     . Blood Pressure Monitoring KIT 1 kit by Does not apply route once a week. 1 kit 0   . cyclobenzaprine (FLEXERIL) 5 MG tablet Take 10 mg by mouth as needed for muscle spasms.      .Marland Kitchendocusate sodium (COLACE) 100 MG capsule Take 1 capsule (100 mg total) by mouth 2 (two) times daily as needed. 30 capsule 2   . Elastic Bandages & Supports (COMFORT FIT MATERNITY SUPP MED) MISC 1 Device by Does not apply route daily. 1 each 0   . ferrous sulfate 325 (65 FE) MG tablet Take 1 tablet (325 mg total) by mouth daily. 100 tablet 1   .  Prenatal Vit-Fe Fumarate-FA (MULTIVITAMIN-PRENATAL) 27-0.8 MG TABS tablet Take 1 tablet by mouth daily at 12 noon. 30 tablet 11     Review of Systems  Constitutional: Negative for chills and fever.  Respiratory: Negative for cough and shortness of breath.   Gastrointestinal: Positive for abdominal pain. Negative for constipation, diarrhea, nausea and vomiting.  Genitourinary: Negative for difficulty urinating, dysuria, pelvic pain, vaginal bleeding and vaginal discharge.  Musculoskeletal: Positive for back pain.  Neurological: Negative for dizziness, light-headedness and headaches.   Physical Exam   Blood pressure 119/68, pulse 88, temperature 98.1 F (36.7 C), temperature source Oral, resp. rate 18, height '5\' 4"'  (1.626 m), weight 105.6 kg, last menstrual period 12/13/2018, SpO2 100 %.  Physical Exam  Vitals reviewed. Constitutional: She is oriented to person, place, and time. She appears well-developed and well-nourished.  HENT:  Head: Normocephalic and atraumatic.  Eyes: Conjunctivae are normal.  Cardiovascular: Normal rate, regular rhythm and  normal heart sounds.  Respiratory: Effort normal and breath sounds normal.  GI: Soft. Bowel sounds are normal. There is no abdominal tenderness.  Musculoskeletal:        General: Normal range of motion.     Cervical back: Normal range of motion.  Neurological: She is alert and oriented to person, place, and time.  Skin: Skin is warm and dry.  Psychiatric: She has a normal mood and affect. Her behavior is normal.    Fetal Assessment 145 bpm, Mod Var, -Decels, +10x10Accels Toco: None graphed  MAU Course   Results for orders placed or performed during the hospital encounter of 06/26/19 (from the past 24 hour(s))  Urinalysis, Routine w reflex microscopic     Status: None   Collection Time: 06/26/19  8:25 PM  Result Value Ref Range   Color, Urine YELLOW YELLOW   APPearance CLEAR CLEAR   Specific Gravity, Urine 1.005 1.005 - 1.030   pH 7.0 5.0 - 8.0   Glucose, UA NEGATIVE NEGATIVE mg/dL   Hgb urine dipstick NEGATIVE NEGATIVE   Bilirubin Urine NEGATIVE NEGATIVE   Ketones, ur NEGATIVE NEGATIVE mg/dL   Protein, ur NEGATIVE NEGATIVE mg/dL   Nitrite NEGATIVE NEGATIVE   Leukocytes,Ua NEGATIVE NEGATIVE  CBC     Status: Abnormal   Collection Time: 06/26/19 10:04 PM  Result Value Ref Range   WBC 7.7 4.0 - 10.5 K/uL   RBC 3.61 (L) 3.87 - 5.11 MIL/uL   Hemoglobin 10.7 (L) 12.0 - 15.0 g/dL   HCT 31.6 (L) 36.0 - 46.0 %   MCV 87.5 80.0 - 100.0 fL   MCH 29.6 26.0 - 34.0 pg   MCHC 33.9 30.0 - 36.0 g/dL   RDW 14.6 11.5 - 15.5 %   Platelets 276 150 - 400 K/uL   nRBC 0.0 0.0 - 0.2 %  Comprehensive metabolic panel     Status: Abnormal   Collection Time: 06/26/19 10:04 PM  Result Value Ref Range   Sodium 134 (L) 135 - 145 mmol/L   Potassium 3.0 (L) 3.5 - 5.1 mmol/L   Chloride 104 98 - 111 mmol/L   CO2 22 22 - 32 mmol/L   Glucose, Bld 79 70 - 99 mg/dL   BUN <5 (L) 6 - 20 mg/dL   Creatinine, Ser 0.56 0.44 - 1.00 mg/dL   Calcium 9.0 8.9 - 10.3 mg/dL   Total Protein 6.7 6.5 - 8.1 g/dL    Albumin 2.7 (L) 3.5 - 5.0 g/dL   AST 13 (L) 15 - 41 U/L   ALT 12 0 -  44 U/L   Alkaline Phosphatase 70 38 - 126 U/L   Total Bilirubin 0.4 0.3 - 1.2 mg/dL   GFR calc non Af Amer >60 >60 mL/min   GFR calc Af Amer >60 >60 mL/min   Anion gap 8 5 - 15   US Abdomen Limited RUQ  Result Date: 06/26/2019 CLINICAL DATA:  Right upper quadrant pain today. EXAM: ULTRASOUND ABDOMEN LIMITED RIGHT UPPER QUADRANT COMPARISON:  None. FINDINGS: Gallbladder: No gallstones or wall thickening visualized. No sonographic Murphy sign noted by sonographer. Common bile duct: Diameter: 0.4 cm Liver: 1.4 cm in diameter echogenic lesion in the left lobe of the liver is most compatible with a benign hemangioma. Portal vein is patent on color Doppler imaging with normal direction of blood flow towards the liver. Other: None. IMPRESSION: Negative for gallstones. No acute abnormality or finding to explain the patient's symptoms. Electronically Signed   By: Inge Rise M.D.   On: 06/26/2019 21:45    MDM PE Labs:CMP, CBC EFM  Assessment and Plan  33 year old [id number]  SIUP at 27.3weeks Cat I FT Abdominal Pain-Resolved  -Exam findings discussed. -Reviewed how pain could be related to gallstones. -Will send for [place]. -Patient reports no pain and declines medication. -Will await for results.   [name] MSN, CNM 06/26/2019, 8:45 PM   Reassessment (11:16 PM) Hypokalemia  -[place] returns without significant findings for gallstones. -CMP returns with low potassium levels. -Patient informed of findings and questions what she can do to increase. -Reviewed potassium rich foods and given information sheet in discharge paperwork. -Will send script for Kdur 51m BID x 30 days to pharmacy on file. -Plan for repeat testing as necessary.  -Discussed how low potassium can contribute to muscle spasms. -Encouraged to monitor symptoms r/t abdominal pain including what she was doing prior to them occurring and how long until  resolution.  -Patient without further questions or concerns. -Instructed to keep next scheduled appt. -Encouraged to call or return to MAU if symptoms worsen or with the onset of new symptoms. -Discharged to home in stable condition.  JMaryann ConnersMSN, CNM Advanced Practice Provider, Center for [facility]

## 2019-06-26 NOTE — Discharge Instructions (Signed)

## 2019-06-27 ENCOUNTER — Encounter: Payer: Self-pay | Admitting: Obstetrics

## 2019-06-27 ENCOUNTER — Telehealth (INDEPENDENT_AMBULATORY_CARE_PROVIDER_SITE_OTHER): Payer: Medicaid Other | Admitting: Obstetrics

## 2019-06-27 VITALS — BP 116/75

## 2019-06-27 DIAGNOSIS — E669 Obesity, unspecified: Secondary | ICD-10-CM

## 2019-06-27 DIAGNOSIS — Z3A28 28 weeks gestation of pregnancy: Secondary | ICD-10-CM

## 2019-06-27 DIAGNOSIS — A6009 Herpesviral infection of other urogenital tract: Secondary | ICD-10-CM

## 2019-06-27 DIAGNOSIS — O99213 Obesity complicating pregnancy, third trimester: Secondary | ICD-10-CM

## 2019-06-27 DIAGNOSIS — Z3480 Encounter for supervision of other normal pregnancy, unspecified trimester: Secondary | ICD-10-CM

## 2019-06-27 DIAGNOSIS — O98313 Other infections with a predominantly sexual mode of transmission complicating pregnancy, third trimester: Secondary | ICD-10-CM

## 2019-07-02 NOTE — Progress Notes (Addendum)
TELEHEALTH OBSTETRICS PRENATAL VIRTUAL VIDEO VISIT ENCOUNTER NOTE  Provider location: Center for [facility] [place]   I connected with [name] on 07/02/19 at 10:15 AM EST by OB MyChart Video Encounter at home and verified that I am speaking with the correct person using two identifiers.   I discussed the limitations, risks, security and privacy concerns of performing an evaluation and management service virtually and the availability of in person appointments. I also discussed with the patient that there may be a patient responsible charge related to this service. The patient expressed understanding and agreed to proceed. Subjective:  [name] is a 33 y.o. [id number] at [redacted]w[redacted]d being seen today for ongoing prenatal care.  She is currently monitored for the following issues for this low-risk pregnancy and has ASTHMA; GBS bacteriuria; H/O cesarean section; Supervision of other normal pregnancy, antepartum; Adopted; Obesity (BMI 30-39.9); Genital herpes affecting pregnancy; Anemia affecting pregnancy; and Alpha thalassemia silent carrier on their problem list.  Patient reports no complaints.   .  .   . Denies any leaking of fluid.   The following portions of the patient's history were reviewed and updated as appropriate: allergies, current medications, past family history, past medical history, past social history, past surgical history and problem list.   Objective:   Vitals:   06/27/19 1019  BP: 116/75    Fetal Status:           General:  Alert, oriented and cooperative. Patient is in no acute distress.  Respiratory: Normal respiratory effort, no problems with respiration noted  Mental Status: Normal mood and affect. Normal behavior. Normal judgment and thought content.  Rest of physical exam deferred due to type of encounter  Imaging: US Abdomen Limited RUQ  Result Date: 06/26/2019 CLINICAL DATA:  Right upper quadrant pain today. EXAM: ULTRASOUND ABDOMEN  LIMITED RIGHT UPPER QUADRANT COMPARISON:  None. FINDINGS: Gallbladder: No gallstones or wall thickening visualized. No sonographic Murphy sign noted by sonographer. Common bile duct: Diameter: 0.4 cm Liver: 1.4 cm in diameter echogenic lesion in the left lobe of the liver is most compatible with a benign hemangioma. Portal vein is patent on color Doppler imaging with normal direction of blood flow towards the liver. Other: None. IMPRESSION: Negative for gallstones. No acute abnormality or finding to explain the patient's symptoms. Electronically Signed   By: Drusilla Kanner M.D.   On: 06/26/2019 21:45    Assessment and Plan:  Pregnancy: [id number] at [redacted]w[redacted]d 1. Supervision of other normal pregnancy, antepartum   2. Genital herpes affecting pregnancy in third trimester - stable clinically.  Start suppression at 32 weeks  3. Obesity (BMI 30-39.9)   Preterm labor symptoms and general obstetric precautions including but not limited to vaginal bleeding, contractions, leaking of fluid and fetal movement were reviewed in detail with the patient. I discussed the assessment and treatment plan with the patient. The patient was provided an opportunity to ask questions and all were answered. The patient agreed with the plan and demonstrated an understanding of the instructions. The patient was advised to call back or seek an in-person office evaluation/go to MAU at [facility] for any urgent or concerning symptoms. Please refer to After Visit Summary for other counseling recommendations.   I provided 10 minutes of face-to-face time during this encounter.  Return in about 2 weeks (around 07/11/2019) for MyChart.  Future Appointments  Date Time Provider Department Center  07/11/2019  9:45 AM Willodean Rosenthal, MD [facility] None  Baltazar Najjar, MD Center for [facility] [place] [facility] 07/02/2019

## 2019-07-04 ENCOUNTER — Telehealth: Payer: Self-pay

## 2019-07-04 ENCOUNTER — Other Ambulatory Visit: Payer: Self-pay | Admitting: Obstetrics

## 2019-07-04 DIAGNOSIS — R110 Nausea: Secondary | ICD-10-CM

## 2019-07-04 MED ORDER — PROMETHAZINE HCL 25 MG PO TABS: 25.0000 mg | ORAL_TABLET | Freq: Four times a day (QID) | ORAL | 1 refills | Status: DC | PRN

## 2019-07-04 NOTE — Telephone Encounter (Signed)
Pt states she had recent hospital visit was given Rx for potassium. Pt has since had nausea all day keeping her in bed. Can a prescription be sent to help with nausea or can she stop potassium medication?

## 2019-07-11 ENCOUNTER — Encounter: Payer: Self-pay | Admitting: Obstetrics & Gynecology

## 2019-07-11 ENCOUNTER — Telehealth (INDEPENDENT_AMBULATORY_CARE_PROVIDER_SITE_OTHER): Payer: Medicaid Other | Admitting: Obstetrics & Gynecology

## 2019-07-11 VITALS — BP 109/73 | HR 88

## 2019-07-11 DIAGNOSIS — O99213 Obesity complicating pregnancy, third trimester: Secondary | ICD-10-CM

## 2019-07-11 DIAGNOSIS — Z3480 Encounter for supervision of other normal pregnancy, unspecified trimester: Secondary | ICD-10-CM

## 2019-07-11 DIAGNOSIS — Z3A30 30 weeks gestation of pregnancy: Secondary | ICD-10-CM

## 2019-07-11 DIAGNOSIS — O98313 Other infections with a predominantly sexual mode of transmission complicating pregnancy, third trimester: Secondary | ICD-10-CM

## 2019-07-11 DIAGNOSIS — E669 Obesity, unspecified: Secondary | ICD-10-CM

## 2019-07-11 DIAGNOSIS — A6009 Herpesviral infection of other urogenital tract: Secondary | ICD-10-CM

## 2019-07-11 DIAGNOSIS — Z0282 Encounter for adoption services: Secondary | ICD-10-CM

## 2019-07-11 DIAGNOSIS — O99013 Anemia complicating pregnancy, third trimester: Secondary | ICD-10-CM

## 2019-07-11 DIAGNOSIS — Z98891 History of uterine scar from previous surgery: Secondary | ICD-10-CM

## 2019-07-11 DIAGNOSIS — O99012 Anemia complicating pregnancy, second trimester: Secondary | ICD-10-CM

## 2019-07-11 DIAGNOSIS — R8271 Bacteriuria: Secondary | ICD-10-CM

## 2019-07-11 NOTE — Progress Notes (Signed)
TELEHEALTH OBSTETRICS PRENATAL VIRTUAL VIDEO VISIT ENCOUNTER NOTE  Provider location: Center for [facility] at [place]   I connected with Abelardo Diesel on 07/11/19 at  9:45 AM EST by MyChart Video Encounter at home and verified that I am speaking with the correct person using two identifiers.   I discussed the limitations, risks, security and privacy concerns of performing an evaluation and management service virtually and the availability of in person appointments. I also discussed with the patient that there may be a patient responsible charge related to this service. The patient expressed understanding and agreed to proceed. Subjective:  [name] is a 33 y.o. [id number] at [redacted]w[redacted]d being seen today for ongoing prenatal care.  She is currently monitored for the following issues for this low-risk pregnancy and has ASTHMA; GBS bacteriuria; H/O cesarean section; Supervision of other normal pregnancy, antepartum; Adopted; Obesity (BMI 30-39.9); Genital herpes affecting pregnancy; Anemia affecting pregnancy; and Alpha thalassemia silent carrier on their problem list.  Patient reports no complaints.  Contractions: Irritability. Vag. Bleeding: None.  Movement: Present. Denies any leaking of fluid.   The following portions of the patient's history were reviewed and updated as appropriate: allergies, current medications, past family history, past medical history, past social history, past surgical history and problem list.   Objective:   Vitals:   07/11/19 0920  BP: 109/73  Pulse: 88    Fetal Status:     Movement: Present     General:  Alert, oriented and cooperative. Patient is in no acute distress.  Respiratory: Normal respiratory effort, no problems with respiration noted  Mental Status: Normal mood and affect. Normal behavior. Normal judgment and thought content.  Rest of physical exam deferred due to type of encounter  Imaging: US Abdomen Limited RUQ  Result Date:  06/26/2019 CLINICAL DATA:  Right upper quadrant pain today. EXAM: ULTRASOUND ABDOMEN LIMITED RIGHT UPPER QUADRANT COMPARISON:  None. FINDINGS: Gallbladder: No gallstones or wall thickening visualized. No sonographic Murphy sign noted by sonographer. Common bile duct: Diameter: 0.4 cm Liver: 1.4 cm in diameter echogenic lesion in the left lobe of the liver is most compatible with a benign hemangioma. Portal vein is patent on color Doppler imaging with normal direction of blood flow towards the liver. Other: None. IMPRESSION: Negative for gallstones. No acute abnormality or finding to explain the patient's symptoms. Electronically Signed   By: Inge Rise M.D.   On: 06/26/2019 21:45    Assessment and Plan:  Pregnancy: [id number] at [redacted]w[redacted]d 1. Anemia affecting pregnancy in second trimester  2. Obesity (BMI 30-39.9)  3. Genital herpes affecting pregnancy in third trimester Rec antiviral at 36 weeks  4. GBS bacteriuria  5. Supervision of other normal pregnancy, antepartum Good FM  6. H/O cesarean section For repeat at 81 weeks Sent for posting   7. Adopted  Preterm labor symptoms and general obstetric precautions including but not limited to vaginal bleeding, contractions, leaking of fluid and fetal movement were reviewed in detail with the patient. I discussed the assessment and treatment plan with the patient. The patient was provided an opportunity to ask questions and all were answered. The patient agreed with the plan and demonstrated an understanding of the instructions. The patient was advised to call back or seek an in-person office evaluation/go to MAU at [facility] for any urgent or concerning symptoms. Please refer to After Visit Summary for other counseling recommendations.   I provided 16 minutes of face-to-face time during this encounter.  No follow-ups  on file.  Future Appointments  Date Time Provider Department Center  07/11/2019  9:45 AM Willodean Rosenthal, MD [facility] None    Willodean Rosenthal, MD Center for [facility]

## 2019-07-11 NOTE — Progress Notes (Signed)
Pt presents for Mychart ROB. Pt identified with 2 patient identifiers. She has no concerns today.

## 2019-07-24 ENCOUNTER — Telehealth: Payer: Self-pay

## 2019-07-24 NOTE — Telephone Encounter (Signed)
Patient called and reports itching all over body, no rash. Pt denies any other symptoms at this time. She has an ROB appt for tomorrow at 10:45 with Dr. Debroah Loop. Pt would like to know if she needs to be seen sooner or go to the hospital.

## 2019-07-24 NOTE — Telephone Encounter (Signed)
Okay thank you, she said she will take benadryl wait until her appt tomorrow. I put it in her appt note to check bile acids.

## 2019-07-25 ENCOUNTER — Other Ambulatory Visit: Payer: Self-pay

## 2019-07-25 ENCOUNTER — Ambulatory Visit (INDEPENDENT_AMBULATORY_CARE_PROVIDER_SITE_OTHER): Payer: Medicaid Other | Admitting: Obstetrics & Gynecology

## 2019-07-25 VITALS — HR 92 | Wt 231.0 lb

## 2019-07-25 DIAGNOSIS — Z98891 History of uterine scar from previous surgery: Secondary | ICD-10-CM

## 2019-07-25 DIAGNOSIS — O99213 Obesity complicating pregnancy, third trimester: Secondary | ICD-10-CM

## 2019-07-25 DIAGNOSIS — E669 Obesity, unspecified: Secondary | ICD-10-CM

## 2019-07-25 DIAGNOSIS — Z3A32 32 weeks gestation of pregnancy: Secondary | ICD-10-CM

## 2019-07-25 DIAGNOSIS — Z3480 Encounter for supervision of other normal pregnancy, unspecified trimester: Secondary | ICD-10-CM

## 2019-07-25 NOTE — Progress Notes (Signed)
   PRENATAL VISIT NOTE  Subjective:  Florice Hindle is a 34 y.o. [phone] at [redacted]w[redacted]d being seen today for ongoing prenatal care.  She is currently monitored for the following issues for this high-risk pregnancy and has ASTHMA; GBS bacteriuria; H/O cesarean section; Supervision of other normal pregnancy, antepartum; Adopted; Obesity (BMI 30-39.9); Genital herpes affecting pregnancy; Anemia affecting pregnancy; and Alpha thalassemia silent carrier on their problem list.  Patient reports no complaints.  Contractions: Irregular. Vag. Bleeding: None.  Movement: Present. Denies leaking of fluid.   The following portions of the patient's history were reviewed and updated as appropriate: allergies, current medications, past family history, past medical history, past social history, past surgical history and problem list.   Objective:   Vitals:   07/25/19 1052  Pulse: 92  Weight: 231 lb (104.8 kg)    Fetal Status: Fetal Heart Rate (bpm): 135   Movement: Present     General:  Alert, oriented and cooperative. Patient is in no acute distress.  Skin: Skin is warm and dry. No rash noted.   Cardiovascular: Normal heart rate noted  Respiratory: Normal respiratory effort, no problems with respiration noted  Abdomen: Soft, gravid, appropriate for gestational age.  Pain/Pressure: Absent     Pelvic: Cervical exam deferred        Extremities: Normal range of motion.  Edema: Trace  Mental Status: Normal mood and affect. Normal behavior. Normal judgment and thought content.  itching Assessment and Plan:  Pregnancy: [id number] at [redacted]w[redacted]d 1. Supervision of other normal pregnancy, antepartum Itching, r/o cholestasis - Bile acids - Basic metabolic panel  2. Obesity (BMI 30-39.9)   3. H/O cesarean section repeat  Preterm labor symptoms and general obstetric precautions including but not limited to vaginal bleeding, contractions, leaking of fluid and fetal movement were reviewed in detail with the  patient. Please refer to After Visit Summary for other counseling recommendations.   Return in about 2 weeks (around 08/08/2019).  No future appointments.  Scheryl Darter, MD

## 2019-07-25 NOTE — Patient Instructions (Signed)
Cholestasis of Pregnancy  Cholestasis refers to any condition that causes the flow of digestive fluid (bile) produced by the liver to slow down or stop. Cholestasis of pregnancy is most common toward the end of pregnancy (thirdtrimester), but it can occur any time during pregnancy. The condition often goes away soon after giving birth. Cholestasis may be uncomfortable, but it is usually harmless to you. However, it can be harmful to your baby. Cholestasis may increase the risk of:  Your baby being born too early (preterm delivery).  Your baby having a slow heart rate and lack of oxygen during delivery (fetal distress).  Losing your baby before delivery (stillbirth). What are the causes? The exact cause of this condition is not known, but it may be related to:  Pregnancy hormones. The gallbladder normally holds bile until you need it to help digest fat in your diet. Pregnancy hormones may cause the flow of bile to slow down and back up into your liver. Bile may then get into your bloodstream and cause cholestasis symptoms.  Changes in your genes (genetic mutations). Specifically, genes that affect how the liver releases bile. What increases the risk? You are more likely to develop this condition if:  You had cholestasis during a previous pregnancy.  You have a family history of cholestasis.  You have liver problems.  You are having multiple babies, such as twins or triplets. What are the signs or symptoms? The most common symptom of this condition is intense itching (pruritus), especially on the palms of your hands and soles of your feet. The itching can spread to the rest of your body and is often worse at night. You will not usually have a rash. Other symptoms may include:  Feeling tired.  Pain in your upper right abdomen.  Dark-colored urine.  Light-colored stools.  Poor appetite.  Yellowish discoloration of your skin and the whites of your eyes (jaundice). How is this  diagnosed? This condition is diagnosed based on:  Your medical history.  A physical exam.  Blood tests. If you have an inherited risk for developing this condition, you may also have genetic testing. How is this treated? The goal of treatment is to make you comfortable and keep your baby safe. Your health care provider may:  Prescribe medicine to reduce bile acid in your bloodstream, relieve symptoms, and help keep your baby safe.  Give you vitamin K before delivery to prevent excessive bleeding.  Check your baby frequently (fetal monitoring).  Perform regular blood tests to check your bile levels and liver function until your baby is delivered.  Recommend starting (inducing) your labor and delivery by week 36 or 37 of pregnancy, or as soon as your baby's lungs have developed enough. Follow these instructions at home:  Take over-the-counter and prescription medicines only as told by your health care provider.  Take cool baths to soothe itchy skin.  Wear comfortable, loose-fitting, cotton clothing to reduce itching.  Keep your fingernails short to prevent skin irritation from scratching.  Keep all follow-up visits and prenatal visits as told by your health care provider. This is important. Contact a health care provider if:  Your symptoms get worse, even with treatment.  You develop pain in your right side.  You have unusual swelling in your abdomen, feet, ankles, or legs.  You have a fever.  You are more thirsty than usual. Get help right away if:  You go into early labor.  You have a headache that does not go away or   causes changes in vision.  You have nausea or you vomit.  You have severe pain in your abdomen or shoulders.  You have shortness of breath. Summary  Cholestasis of pregnancy is most common toward the end of pregnancy (thirdtrimester), but it can occur any time during your pregnancy.  The condition often goes away soon after your baby is  born.  The most common symptom of cholestasis of pregnancy is intense itching (pruritus), especially on the palms of your hands and soles of your feet.  This condition may be treated with medicine, frequent monitoring, or starting (inducing) labor and delivery by week 36 or 37 of pregnancy. This information is not intended to replace advice given to you by your health care provider. Make sure you discuss any questions you have with your health care provider. Document Revised: 10/19/2018 Document Reviewed: 06/12/2016 Elsevier Patient Education  2020 [employer] Inc[employer]  

## 2019-07-26 ENCOUNTER — Telehealth: Payer: Self-pay

## 2019-07-26 NOTE — Telephone Encounter (Signed)
Pt called and reports she is still itching and wanted to see if her bile acid results were back yet. I advised that the lab results are not in yet. I also advised that she could use benadryl cream or hydrocortisone cream topically to help with the itching, pt reported the oral benadryl is not relieving her symptoms. I advised pt we will let her know results when they are in and further recommendations, pt voices understanding.

## 2019-07-27 ENCOUNTER — Telehealth: Payer: Self-pay

## 2019-07-27 LAB — BASIC METABOLIC PANEL
BUN/Creatinine Ratio: 5 — ABNORMAL LOW (ref 9–23)
BUN: 3 mg/dL — ABNORMAL LOW (ref 6–20)
CO2: 19 mmol/L — ABNORMAL LOW (ref 20–29)
Calcium: 9.3 mg/dL (ref 8.7–10.2)
Chloride: 104 mmol/L (ref 96–106)
Creatinine, Ser: 0.55 mg/dL — ABNORMAL LOW (ref 0.57–1.00)
GFR calc Af Amer: 143 mL/min/{1.73_m2} (ref >59)
GFR calc non Af Amer: 124 mL/min/{1.73_m2} (ref >59)
Glucose: 78 mg/dL (ref 65–99)
Potassium: 4 mmol/L (ref 3.5–5.2)
Sodium: 135 mmol/L (ref 134–144)

## 2019-07-27 LAB — BILE ACIDS, TOTAL: Bile Acids Total: 1.5 umol/L (ref 0.0–10.0)

## 2019-07-27 NOTE — Telephone Encounter (Signed)
Pt called because she saw her lab results on MyChart and wanted to know if they were normal. I advised pt that the provider has not had a chance to look at them yet, but that I could tell her that her bile acids were in the normal range. I advised pt that a provider would let her know about her other results once they review them. Pt voices understanding.

## 2019-07-28 ENCOUNTER — Inpatient Hospital Stay (HOSPITAL_COMMUNITY)
Admission: AD | Admit: 2019-07-28 | Discharge: 2019-07-28 | Disposition: A | Discharge status: Home / Self Care | Payer: Medicaid Other | Attending: Obstetrics and Gynecology | Admitting: Obstetrics and Gynecology

## 2019-07-28 ENCOUNTER — Encounter (HOSPITAL_COMMUNITY): Payer: Self-pay | Admitting: Obstetrics and Gynecology

## 2019-07-28 ENCOUNTER — Inpatient Hospital Stay (HOSPITAL_BASED_OUTPATIENT_CLINIC_OR_DEPARTMENT_OTHER): Payer: Medicaid Other

## 2019-07-28 ENCOUNTER — Other Ambulatory Visit: Payer: Self-pay

## 2019-07-28 DIAGNOSIS — O368130 Decreased fetal movements, third trimester, not applicable or unspecified: Secondary | ICD-10-CM | POA: Insufficient documentation

## 2019-07-28 DIAGNOSIS — Z3689 Encounter for other specified antenatal screening: Secondary | ICD-10-CM

## 2019-07-28 DIAGNOSIS — Y9289 Other specified places as the place of occurrence of the external cause: Secondary | ICD-10-CM | POA: Diagnosis not present

## 2019-07-28 DIAGNOSIS — Z3A32 32 weeks gestation of pregnancy: Secondary | ICD-10-CM | POA: Insufficient documentation

## 2019-07-28 DIAGNOSIS — Z3A33 33 weeks gestation of pregnancy: Secondary | ICD-10-CM

## 2019-07-28 DIAGNOSIS — O26893 Other specified pregnancy related conditions, third trimester: Secondary | ICD-10-CM | POA: Diagnosis not present

## 2019-07-28 DIAGNOSIS — T7589XA Other specified effects of external causes, initial encounter: Secondary | ICD-10-CM | POA: Diagnosis not present

## 2019-07-28 DIAGNOSIS — Y9389 Activity, other specified: Secondary | ICD-10-CM | POA: Insufficient documentation

## 2019-07-28 DIAGNOSIS — O9A213 Injury, poisoning and certain other consequences of external causes complicating pregnancy, third trimester: Secondary | ICD-10-CM | POA: Insufficient documentation

## 2019-07-28 DIAGNOSIS — W2209XA Striking against other stationary object, initial encounter: Secondary | ICD-10-CM | POA: Diagnosis not present

## 2019-07-28 DIAGNOSIS — R109 Unspecified abdominal pain: Secondary | ICD-10-CM | POA: Insufficient documentation

## 2019-07-28 DIAGNOSIS — Z79899 Other long term (current) drug therapy: Secondary | ICD-10-CM | POA: Diagnosis not present

## 2019-07-28 HISTORY — DX: Herpesviral infection, unspecified: B00.9
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
## 2019-07-28 NOTE — Discharge Instructions (Signed)
Fetal Movement Counts Patient Name: ________________________________________________ Patient Due Date: ____________________ What is a fetal movement count?  A fetal movement count is the number of times that you feel your baby move during a certain amount of time. This may also be called a fetal kick count. A fetal movement count is recommended for every pregnant woman. You may be asked to start counting fetal movements as early as week 28 of your pregnancy. Pay attention to when your baby is most active. You may notice your baby's sleep and wake cycles. You may also notice things that make your baby move more. You should do a fetal movement count:  When your baby is normally most active.  At the same time each day. A good time to count movements is while you are resting, after having something to eat and drink. How do I count fetal movements? 1. Find a quiet, comfortable area. Sit, or lie down on your side. 2. Write down the date, the start time and stop time, and the number of movements that you felt between those two times. Take this information with you to your health care visits. 3. Write down your start time when you feel the first movement. 4. Count kicks, flutters, swishes, rolls, and jabs. You should feel at least 10 movements. 5. You may stop counting after you have felt 10 movements, or if you have been counting for 2 hours. Write down the stop time. 6. If you do not feel 10 movements in 2 hours, contact your health care provider for further instructions. Your health care provider may want to do additional tests to assess your baby's well-being. Contact a health care provider if:  You feel fewer than 10 movements in 2 hours.  Your baby is not moving like he or she usually does. Date: ____________ Start time: ____________ Stop time: ____________ Movements: ____________ Date: ____________ Start time: ____________ Stop time: ____________ Movements: ____________ Date: ____________  Start time: ____________ Stop time: ____________ Movements: ____________ Date: ____________ Start time: ____________ Stop time: ____________ Movements: ____________ Date: ____________ Start time: ____________ Stop time: ____________ Movements: ____________ Date: ____________ Start time: ____________ Stop time: ____________ Movements: ____________ Date: ____________ Start time: ____________ Stop time: ____________ Movements: ____________ Date: ____________ Start time: ____________ Stop time: ____________ Movements: ____________ Date: ____________ Start time: ____________ Stop time: ____________ Movements: ____________ This information is not intended to replace advice given to you by your health care provider. Make sure you discuss any questions you have with your health care provider. Document Revised: 02/15/2019 Document Reviewed: 02/15/2019 Elsevier Patient Education  2020 [employer]  
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
## 2019-07-30 ENCOUNTER — Encounter (HOSPITAL_COMMUNITY): Payer: Self-pay | Admitting: Obstetrics and Gynecology

## 2019-07-30 ENCOUNTER — Inpatient Hospital Stay (HOSPITAL_COMMUNITY)
Admission: AD | Admit: 2019-07-30 | Discharge: 2019-07-30 | Disposition: A | Discharge status: Home / Self Care | Payer: Medicaid Other | Attending: Obstetrics and Gynecology | Admitting: Obstetrics and Gynecology

## 2019-07-30 ENCOUNTER — Other Ambulatory Visit: Payer: Self-pay

## 2019-07-30 DIAGNOSIS — Z3A32 32 weeks gestation of pregnancy: Secondary | ICD-10-CM | POA: Diagnosis not present

## 2019-07-30 DIAGNOSIS — Z0282 Encounter for adoption services: Secondary | ICD-10-CM

## 2019-07-30 DIAGNOSIS — D649 Anemia, unspecified: Secondary | ICD-10-CM | POA: Insufficient documentation

## 2019-07-30 DIAGNOSIS — B009 Herpesviral infection, unspecified: Secondary | ICD-10-CM | POA: Diagnosis not present

## 2019-07-30 DIAGNOSIS — Z79899 Other long term (current) drug therapy: Secondary | ICD-10-CM | POA: Insufficient documentation

## 2019-07-30 DIAGNOSIS — O99012 Anemia complicating pregnancy, second trimester: Secondary | ICD-10-CM

## 2019-07-30 DIAGNOSIS — Z3A35 35 weeks gestation of pregnancy: Secondary | ICD-10-CM

## 2019-07-30 DIAGNOSIS — O4703 False labor before 37 completed weeks of gestation, third trimester: Secondary | ICD-10-CM

## 2019-07-30 DIAGNOSIS — O479 False labor, unspecified: Secondary | ICD-10-CM

## 2019-07-30 DIAGNOSIS — Z3689 Encounter for other specified antenatal screening: Secondary | ICD-10-CM | POA: Diagnosis not present

## 2019-07-30 DIAGNOSIS — O9A213 Injury, poisoning and certain other consequences of external causes complicating pregnancy, third trimester: Secondary | ICD-10-CM

## 2019-07-30 DIAGNOSIS — O99013 Anemia complicating pregnancy, third trimester: Secondary | ICD-10-CM

## 2019-07-30 DIAGNOSIS — O98513 Other viral diseases complicating pregnancy, third trimester: Secondary | ICD-10-CM | POA: Diagnosis not present

## 2019-07-30 DIAGNOSIS — R8271 Bacteriuria: Secondary | ICD-10-CM

## 2019-07-30 DIAGNOSIS — Z789 Other specified health status: Secondary | ICD-10-CM

## 2019-07-30 DIAGNOSIS — E669 Obesity, unspecified: Secondary | ICD-10-CM

## 2019-07-30 DIAGNOSIS — A6009 Herpesviral infection of other urogenital tract: Secondary | ICD-10-CM

## 2019-07-30 LAB — URINALYSIS, ROUTINE W REFLEX MICROSCOPIC
Bilirubin Urine: NEGATIVE
Glucose, UA: NEGATIVE mg/dL
Hgb urine dipstick: NEGATIVE
Ketones, ur: NEGATIVE mg/dL
Leukocytes,Ua: NEGATIVE
Nitrite: NEGATIVE
Protein, ur: NEGATIVE mg/dL
Specific Gravity, Urine: 1.001 — ABNORMAL LOW (ref 1.005–1.030)
pH: 7 (ref 5.0–8.0)

## 2019-07-30 NOTE — MAU Note (Signed)
Having contractions all day.  At 6:43 started timing them and they were 20 min apart.  Was drinking water but didn't help.  No leaking.  No bleeding. Baby moving well. Felt some cramps also on the way, mild period cramps.

## 2019-07-30 NOTE — MAU Provider Note (Signed)
History     CSN: [id number]  Arrival date and time: 07/30/19 2024   First Provider Initiated Contact with Patient 07/30/19 2103      Chief Complaint  Patient presents with  . Contractions   [name] is a 34 y.o. G6P3 at 43w5dby LMP who presents to MAU with complaints of contractions. She reports BH contractions that started around 1845 this evening, started timing them and reports to be every 20-25 minutes prior to arrival to MAU. She reports some contractions she needed to breath through and other she didn't, she reports being able to walk and talk through all the contractions. She reports since being in MAU having 1 contraction. Denies recent IC, denies vaginal bleeding, LOF or discharge. +FM. Denies having her cervix checked throughout this pregnancy and reports never going into labor on own "so does not know what labor feels like".    OB History    Gravida  6   Para  3   Term  3   Preterm  0   AB  2   Living  3     SAB  0   TAB  2   Ectopic  0   Multiple  0   Live Births  3           Past Medical History:  Diagnosis Date  . Acne   . Anemia   . Chlamydia   . HSV (herpes simplex virus) infection   . Infection    UTI    Past Surgical History:  Procedure Laterality Date  . CESAREAN SECTION    . DILATION AND CURETTAGE OF UTERUS      Family History  Adopted: Yes  Problem Relation Age of Onset  . Other Neg Hx     Social History   Tobacco Use  . Smoking status: Never Smoker  . Smokeless tobacco: Never Used  Substance Use Topics  . Alcohol use: No  . Drug use: No    Allergies: No Known Allergies  Medications Prior to Admission  Medication Sig Dispense Refill Last Dose  . acetaminophen (TYLENOL) 500 MG tablet Take 1,000 mg by mouth every 6 (six) hours as needed for moderate pain.   Past Week at Unknown time  . Blood Pressure Monitoring KIT 1 kit by Does not apply route once a week. 1 kit 0 Past Week at Unknown time  .  cyclobenzaprine (FLEXERIL) 5 MG tablet Take 10 mg by mouth as needed for muscle spasms.    Past Month at Unknown time  . Elastic Bandages & Supports (COMFORT FIT MATERNITY SUPP MED) MISC 1 Device by Does not apply route daily. 1 each 0 Past Month at Unknown time  . ferrous sulfate 325 (65 FE) MG tablet Take 1 tablet (325 mg total) by mouth daily. 100 tablet 1 07/30/2019 at Unknown time  . Prenatal Vit-Fe Fumarate-FA (MULTIVITAMIN-PRENATAL) 27-0.8 MG TABS tablet Take 1 tablet by mouth daily at 12 noon. 30 tablet 11 07/30/2019 at Unknown time  . promethazine (PHENERGAN) 25 MG tablet Take 1 tablet (25 mg total) by mouth every 6 (six) hours as needed for nausea or vomiting. 30 tablet 1 Past Month at Unknown time  . docusate sodium (COLACE) 100 MG capsule Take 1 capsule (100 mg total) by mouth 2 (two) times daily as needed. 30 capsule 2 More than a month at Unknown time  . potassium chloride SA (KLOR-CON) 20 MEQ tablet Take 1 tablet (20 mEq total) by mouth 2 (two) times  daily. 60 tablet 1     Review of Systems  Constitutional: Negative.   Respiratory: Negative.   Cardiovascular: Negative.   Gastrointestinal: Positive for abdominal pain. Negative for constipation, diarrhea, nausea and vomiting.       Contractions   Genitourinary: Negative.   Musculoskeletal: Negative.   Neurological: Negative.   Psychiatric/Behavioral: Negative.    Physical Exam   Blood pressure 122/64, pulse 84, temperature 98.7 F (37.1 C), temperature source Oral, resp. rate 18, height '5\' 5"'  (1.651 m), weight 108.9 kg, last menstrual period 12/13/2018, SpO2 100 %.  Physical Exam  Nursing note and vitals reviewed. Constitutional: She is oriented to person, place, and time. She appears well-developed and well-nourished. No distress.  Cardiovascular: Normal rate and regular rhythm.  Respiratory: Effort normal and breath sounds normal. No respiratory distress. She has no wheezes.  GI: Soft. There is no abdominal tenderness.  There is no rebound and no guarding.  Gravid appropriate for gestational age  Musculoskeletal:        General: No edema. Normal range of motion.  Neurological: She is alert and oriented to person, place, and time.  Psychiatric: She has a normal mood and affect. Her behavior is normal. Thought content normal.   FHR: 135/ moderate/+accels/ no decelerations  Toco: No UC  Dilation: Closed Effacement (%): Thick Cervical Position: Posterior Presentation: Vertex Exam by:: Wende Bushy CNM  MAU Course  Procedures  MDM NST reactive and reassuring  No UC on toco or by palpation - patient reports 1 UC in triage but none since   Educated and discussed BH contractions and warning signs - discussed reasons to return to MAU, educated on [place] contractions are common at this stage of pregnancy and when she needs to return. Encouraged to rest and hydrate when contractions occur and time them - if occurring every 10-15 minutes or having more than 6 in 1 hour for consistent of 3 hours then return to MAU. Patient verbalizes understanding.   Discussed reasons to return to MAU. Follow up as scheduled in the office. Return to MAU as needed. Pt stable at time of discharge.   Assessment and Plan   1. Braxton Hick's contraction   2. [redacted] weeks gestation of pregnancy   3. NST (non-stress test) reactive on fetal surveillance    Discharge home Follow up as scheduled in the office for prenatal care Return to MAU as needed for reasons discussed and/or emergencies  Hydration and [place]  Preterm labor precautions and warning signs   Follow-up [place] Follow up.   Specialty: Obstetrics and Gynecology Why: Follow up as scheduled for prenatal appointment and return to MAU as needed Contact information: [address], [place] [phone]         Allergies as of 07/30/2019   No Known Allergies     Medication List    TAKE  these medications   acetaminophen 500 MG tablet Commonly known as: TYLENOL Take 1,000 mg by mouth every 6 (six) hours as needed for moderate pain.   Blood Pressure Monitoring Kit 1 kit by Does not apply route once a week.   Comfort Fit Maternity Supp Med Misc 1 Device by Does not apply route daily.   cyclobenzaprine 5 MG tablet Commonly known as: FLEXERIL Take 10 mg by mouth as needed for muscle spasms.   docusate sodium 100 MG capsule Commonly known as: COLACE Take 1 capsule (100 mg total) by  mouth 2 (two) times daily as needed.   ferrous sulfate 325 (65 FE) MG tablet Take 1 tablet (325 mg total) by mouth daily.   multivitamin-prenatal 27-0.8 MG Tabs tablet Take 1 tablet by mouth daily at 12 noon.   potassium chloride SA 20 MEQ tablet Commonly known as: KLOR-CON Take 1 tablet (20 mEq total) by mouth 2 (two) times daily.   promethazine 25 MG tablet Commonly known as: PHENERGAN Take 1 tablet (25 mg total) by mouth every 6 (six) hours as needed for nausea or vomiting.       Lajean Manes CNM 07/30/2019, 9:46 PM

## 2019-08-08 ENCOUNTER — Encounter: Payer: Self-pay | Admitting: Obstetrics and Gynecology

## 2019-08-08 ENCOUNTER — Ambulatory Visit (INDEPENDENT_AMBULATORY_CARE_PROVIDER_SITE_OTHER): Payer: Medicaid Other | Admitting: Obstetrics and Gynecology

## 2019-08-08 ENCOUNTER — Other Ambulatory Visit: Payer: Self-pay

## 2019-08-08 VITALS — BP 110/74 | HR 84 | Wt 237.0 lb

## 2019-08-08 DIAGNOSIS — O98313 Other infections with a predominantly sexual mode of transmission complicating pregnancy, third trimester: Secondary | ICD-10-CM

## 2019-08-08 DIAGNOSIS — Z3480 Encounter for supervision of other normal pregnancy, unspecified trimester: Secondary | ICD-10-CM

## 2019-08-08 DIAGNOSIS — A6009 Herpesviral infection of other urogenital tract: Secondary | ICD-10-CM

## 2019-08-08 DIAGNOSIS — Z3A34 34 weeks gestation of pregnancy: Secondary | ICD-10-CM

## 2019-08-08 DIAGNOSIS — R8271 Bacteriuria: Secondary | ICD-10-CM

## 2019-08-08 DIAGNOSIS — Z98891 History of uterine scar from previous surgery: Secondary | ICD-10-CM

## 2019-08-08 MED ORDER — VALACYCLOVIR HCL 500 MG PO TABS: 500.0000 mg | ORAL_TABLET | Freq: Two times a day (BID) | ORAL | 6 refills | Status: DC

## 2019-08-08 NOTE — Addendum Note (Signed)
Addended by: Leroy Libman on: 08/08/2019 11:42 AM   Modules accepted: Orders

## 2019-08-08 NOTE — Progress Notes (Signed)
   PRENATAL VISIT NOTE  Subjective:  Summerlynn Glauser is a 34 y.o. [phone] at [redacted]w[redacted]d being seen today for ongoing prenatal care.  She is currently monitored for the following issues for this high-risk pregnancy and has ASTHMA; GBS bacteriuria; H/O cesarean section; Supervision of other normal pregnancy, antepartum; Adopted; Obesity (BMI 30-39.9); Genital herpes affecting pregnancy; Anemia affecting pregnancy; Alpha thalassemia silent carrier; Traumatic injury during pregnancy in third trimester; and NST (non-stress test) reactive on their problem list.  Patient reports occasional contractions.  Contractions: Irritability. Vag. Bleeding: None.  Movement: Present. Denies leaking of fluid.   The following portions of the patient's history were reviewed and updated as appropriate: allergies, current medications, past family history, past medical history, past social history, past surgical history and problem list.   Objective:   Vitals:   08/08/19 1108  BP: 110/74  Pulse: 84  Weight: 237 lb (107.5 kg)    Fetal Status: Fetal Heart Rate (bpm): 136   Movement: Present     General:  Alert, oriented and cooperative. Patient is in no acute distress.  Skin: Skin is warm and dry. No rash noted.   Cardiovascular: Normal heart rate noted  Respiratory: Normal respiratory effort, no problems with respiration noted  Abdomen: Soft, gravid, appropriate for gestational age.  Pain/Pressure: Absent     Pelvic: Cervical exam deferred        Extremities: Normal range of motion.  Edema: None  Mental Status: Normal mood and affect. Normal behavior. Normal judgment and thought content.   Assessment and Plan:  Pregnancy: [id number] at [redacted]w[redacted]d  1. Supervision of other normal pregnancy, antepartum Undecided about contraception  2. Genital herpes affecting pregnancy in third trimester Valtrex sent today  3. GBS bacteriuria  4. H/O cesarean section Has RCS scheduled  Preterm labor symptoms and general obstetric  precautions including but not limited to vaginal bleeding, contractions, leaking of fluid and fetal movement were reviewed in detail with the patient. Please refer to After Visit Summary for other counseling recommendations.   Return in about 2 weeks (around 08/22/2019) for high OB, in person, 36 week swabs.  No future appointments.  Conan Bowens, MD

## 2019-08-14 ENCOUNTER — Other Ambulatory Visit: Payer: Self-pay

## 2019-08-14 ENCOUNTER — Telehealth: Payer: Self-pay

## 2019-08-14 DIAGNOSIS — Z3480 Encounter for supervision of other normal pregnancy, unspecified trimester: Secondary | ICD-10-CM

## 2019-08-14 NOTE — Telephone Encounter (Signed)
Pt made aware of recommendations, she voices understanding. Call transferred to schedulers to make appt for lab only.

## 2019-08-14 NOTE — Telephone Encounter (Signed)
Pt called and reports that her itching she was experiencing a couple weeks ago went away and has now come back with a "vengeance". Pt was tested for cholestasis last month and bile acids were normal. Pt would like to know if she needs to get retested. Reports itching is mostly on her feet and lower body. Pt also reports "extreme thirst" and wants to know if this is normal. Pt states she is drinking 3 bottles of water per hour and is still thirsty.

## 2019-08-15 ENCOUNTER — Other Ambulatory Visit: Payer: Self-pay

## 2019-08-15 ENCOUNTER — Other Ambulatory Visit: Payer: Medicaid Other

## 2019-08-15 DIAGNOSIS — Z3480 Encounter for supervision of other normal pregnancy, unspecified trimester: Secondary | ICD-10-CM

## 2019-08-17 LAB — BILE ACIDS, TOTAL: Bile Acids Total: 1.3 umol/L (ref 0.0–10.0)

## 2019-08-22 ENCOUNTER — Other Ambulatory Visit (HOSPITAL_COMMUNITY)
Admission: RE | Admit: 2019-08-22 | Discharge: 2019-08-22 | Disposition: A | Discharge status: Home / Self Care | Payer: Medicaid Other | Source: Ambulatory Visit | Attending: Obstetrics & Gynecology | Admitting: Obstetrics & Gynecology

## 2019-08-22 ENCOUNTER — Ambulatory Visit (INDEPENDENT_AMBULATORY_CARE_PROVIDER_SITE_OTHER): Payer: Medicaid Other | Admitting: Obstetrics & Gynecology

## 2019-08-22 ENCOUNTER — Other Ambulatory Visit: Payer: Self-pay

## 2019-08-22 VITALS — BP 114/76 | HR 85 | Wt 239.0 lb

## 2019-08-22 DIAGNOSIS — Z3480 Encounter for supervision of other normal pregnancy, unspecified trimester: Secondary | ICD-10-CM | POA: Diagnosis not present

## 2019-08-22 DIAGNOSIS — Z3483 Encounter for supervision of other normal pregnancy, third trimester: Secondary | ICD-10-CM

## 2019-08-22 DIAGNOSIS — R8271 Bacteriuria: Secondary | ICD-10-CM

## 2019-08-22 DIAGNOSIS — Z3A36 36 weeks gestation of pregnancy: Secondary | ICD-10-CM

## 2019-08-22 DIAGNOSIS — Z98891 History of uterine scar from previous surgery: Secondary | ICD-10-CM

## 2019-08-22 NOTE — Patient Instructions (Signed)
Cesarean Delivery Cesarean birth, or cesarean delivery, is the surgical delivery of a baby through an incision in the abdomen and the uterus. This may be referred to as a C-section. This procedure may be scheduled ahead of time, or it may be done in an emergency situation. Tell a health care provider about:  Any allergies you have.  All medicines you are taking, including vitamins, herbs, eye drops, creams, and over-the-counter medicines.  Any problems you or family members have had with anesthetic medicines.  Any blood disorders you have.  Any surgeries you have had.  Any medical conditions you have.  Whether you or any members of your family have a history of deep vein thrombosis (DVT) or pulmonary embolism (PE). What are the risks? Generally, this is a safe procedure. However, problems may occur, including:  Infection.  Bleeding.  Allergic reactions to medicines.  Damage to other structures or organs.  Blood clots.  Injury to your baby. What happens before the procedure? General instructions  Follow instructions from your health care provider about eating or drinking restrictions.  If you know that you are going to have a cesarean delivery, do not shave your pubic area. Shaving before the procedure may increase your risk of infection.  Plan to have someone take you home from the hospital.  Ask your health care provider what steps will be taken to prevent infection. These may include: ? Removing hair at the surgery site. ? Washing skin with a germ-killing soap. ? Taking antibiotic medicine.  Depending on the reason for your cesarean delivery, you may have a physical exam or additional testing, such as an ultrasound.  You may have your blood or urine tested. Questions for your health care provider  Ask your health care provider about: ? Changing or stopping your regular medicines. This is especially important if you are taking diabetes medicines or blood  thinners. ? Your pain management plan. This is especially important if you plan to breastfeed your baby. ? How long you will be in the hospital after the procedure. ? Any concerns you may have about receiving blood products, if you need them during the procedure. ? Cord blood banking, if you plan to collect your baby's umbilical cord blood.  You may also want to ask your health care provider: ? Whether you will be able to hold or breastfeed your baby while you are still in the operating room. ? Whether your baby can stay with you immediately after the procedure and during your recovery. ? Whether a family member or a person of your choice can go with you into the operating room and stay with you during the procedure, immediately after the procedure, and during your recovery. What happens during the procedure?   An IV will be inserted into one of your veins.  Fluid and medicines, such as antibiotics, will be given before the surgery.  Fetal monitors will be placed on your abdomen to check your baby's heart rate.  You may be given a special warming gown to wear to keep your temperature stable.  A catheter may be inserted into your bladder through your urethra. This drains your urine during the procedure.  You may be given one or more of the following: ? A medicine to numb the area (local anesthetic). ? A medicine to make you fall asleep (general anesthetic). ? A medicine (regional anesthetic) that is injected into your back or through a small thin tube placed in your back (spinal anesthetic or epidural anesthetic).   This numbs everything below the injection site and allows you to stay awake during your procedure. If this makes you feel nauseous, tell your health care provider. Medicines will be available to help reduce any nausea you may feel.  An incision will be made in your abdomen, and then in your uterus.  If you are awake during your procedure, you may feel tugging and pulling in  your abdomen, but you should not feel pain. If you feel pain, tell your health care provider immediately.  Your baby will be removed from your uterus. You may feel more pressure or pushing while this happens.  Immediately after birth, your baby will be dried and kept warm. You may be able to hold and breastfeed your baby.  The umbilical cord may be clamped and cut during this time. This usually occurs after waiting a period of 1-2 minutes after delivery.  Your placenta will be removed from your uterus.  Your incisions will be closed with stitches (sutures). Staples, skin glue, or adhesive strips may also be applied to the incision in your abdomen.  Bandages (dressings) may be placed over the incision in your abdomen. The procedure may vary among health care providers and hospitals. What happens after the procedure?  Your blood pressure, heart rate, breathing rate, and blood oxygen level will be monitored until you are discharged from the hospital.  You may continue to receive fluids and medicines through an IV.  You will have some pain. Medicines will be available to help control your pain.  To help prevent blood clots: ? You may be given medicines. ? You may have to wear compression stockings or devices. ? You will be encouraged to walk around when you are able.  Hospital staff will encourage and support bonding with your baby. Your hospital may have you and your baby to stay in the same room (rooming in) during your hospital stay to encourage successful bonding and breastfeeding.  You may be encouraged to cough and breathe deeply often. This helps to prevent lung problems.  If you have a catheter draining your urine, it will be removed as soon as possible after your procedure. Summary  Cesarean birth, or cesarean delivery, is the surgical delivery of a baby through an incision in the abdomen and the uterus.  Follow instructions from your health care provider about eating or  drinking restrictions before the procedure.  You will have some pain after the procedure. Medicines will be available to help control your pain.  Hospital staff will encourage and support bonding with your baby after the procedure. Your hospital may have you and your baby to stay in the same room (rooming in) during your hospital stay to encourage successful bonding and breastfeeding. This information is not intended to replace advice given to you by your health care provider. Make sure you discuss any questions you have with your health care provider. Document Revised: 01/02/2018 Document Reviewed: 01/02/2018 Elsevier Patient Education  2020 Elsevier Inc.  

## 2019-08-22 NOTE — Progress Notes (Signed)
   PRENATAL VISIT NOTE  Subjective:  [name] is a 34 y.o. [phone] at [redacted]w[redacted]d being seen today for ongoing prenatal care.  She is currently monitored for the following issues for this high-risk pregnancy and has ASTHMA; GBS bacteriuria; H/O cesarean section; Supervision of other normal pregnancy, antepartum; Adopted; Obesity (BMI 30-39.9); Genital herpes affecting pregnancy; Anemia affecting pregnancy; Alpha thalassemia silent carrier; Traumatic injury during pregnancy in third trimester; and NST (non-stress test) reactive on their problem list.  Patient reports no complaints.  Contractions: Irritability. Vag. Bleeding: None.  Movement: Present. Denies leaking of fluid.   The following portions of the patient's history were reviewed and updated as appropriate: allergies, current medications, past family history, past medical history, past social history, past surgical history and problem list.   Objective:   Vitals:   08/22/19 1057  BP: 114/76  Pulse: 85  Weight: 239 lb (108.4 kg)    Fetal Status: Fetal Heart Rate (bpm): 132   Movement: Present     General:  Alert, oriented and cooperative. Patient is in no acute distress.  Skin: Skin is warm and dry. No rash noted.   Cardiovascular: Normal heart rate noted  Respiratory: Normal respiratory effort, no problems with respiration noted  Abdomen: Soft, gravid, appropriate for gestational age.  Pain/Pressure: Present     Pelvic: Cervical exam deferred        Extremities: Normal range of motion.  Edema: Trace  Mental Status: Normal mood and affect. Normal behavior. Normal judgment and thought content.   Assessment and Plan:  Pregnancy: [id number] at [redacted]w[redacted]d 1. Supervision of other normal pregnancy, antepartum ROUTINE 36 WEEK,  - GC/Chlamydia probe amp ([place])not at [facility]  2. GBS bacteriuria   3. H/O cesarean section Prophylaxis prn  Preterm labor symptoms and general obstetric precautions including but not limited to vaginal  bleeding, contractions, leaking of fluid and fetal movement were reviewed in detail with the patient. Please refer to After Visit Summary for other counseling recommendations.   Return in about 1 week (around 08/29/2019).  No future appointments.  Scheryl Darter, MD

## 2019-08-22 NOTE — Progress Notes (Signed)
ROB   CC: None   Declined Cervix exam

## 2019-08-23 LAB — GC/CHLAMYDIA PROBE AMP (~~LOC~~) NOT AT ARMC
Chlamydia: NEGATIVE
Comment: NEGATIVE
Comment: NORMAL
Neisseria Gonorrhea: NEGATIVE

## 2019-08-29 ENCOUNTER — Ambulatory Visit (INDEPENDENT_AMBULATORY_CARE_PROVIDER_SITE_OTHER): Payer: Medicaid Other | Admitting: Obstetrics & Gynecology

## 2019-08-29 ENCOUNTER — Other Ambulatory Visit: Payer: Self-pay

## 2019-08-29 VITALS — BP 110/73 | HR 76 | Wt 238.0 lb

## 2019-08-29 DIAGNOSIS — R8271 Bacteriuria: Secondary | ICD-10-CM

## 2019-08-29 DIAGNOSIS — Z3A37 37 weeks gestation of pregnancy: Secondary | ICD-10-CM

## 2019-08-29 DIAGNOSIS — O98313 Other infections with a predominantly sexual mode of transmission complicating pregnancy, third trimester: Secondary | ICD-10-CM

## 2019-08-29 DIAGNOSIS — Z98891 History of uterine scar from previous surgery: Secondary | ICD-10-CM

## 2019-08-29 DIAGNOSIS — O34219 Maternal care for unspecified type scar from previous cesarean delivery: Secondary | ICD-10-CM

## 2019-08-29 DIAGNOSIS — A6009 Herpesviral infection of other urogenital tract: Secondary | ICD-10-CM

## 2019-08-29 DIAGNOSIS — Z3480 Encounter for supervision of other normal pregnancy, unspecified trimester: Secondary | ICD-10-CM

## 2019-08-29 NOTE — Progress Notes (Signed)
   PRENATAL VISIT NOTE  Subjective:  [name] is a 34 y.o. [phone] at [redacted]w[redacted]d being seen today for ongoing prenatal care.  She is currently monitored for the following issues for this high-risk pregnancy and has ASTHMA; GBS bacteriuria; H/O cesarean section; Supervision of other normal pregnancy, antepartum; Adopted; Obesity (BMI 30-39.9); Genital herpes affecting pregnancy; Anemia affecting pregnancy; Alpha thalassemia silent carrier; Traumatic injury during pregnancy in third trimester; and NST (non-stress test) reactive on their problem list.  Patient reports no complaints.  Contractions: Irregular. Vag. Bleeding: None.  Movement: Present. Denies leaking of fluid.   The following portions of the patient's history were reviewed and updated as appropriate: allergies, current medications, past family history, past medical history, past social history, past surgical history and problem list.   Objective:   Vitals:   08/29/19 1035  BP: 110/73  Pulse: 76  Weight: 238 lb (108 kg)    Fetal Status: Fetal Heart Rate (bpm): 135 Fundal Height: 38 cm Movement: Present     General:  Alert, oriented and cooperative. Patient is in no acute distress.  Skin: Skin is warm and dry. No rash noted.   Cardiovascular: Normal heart rate noted  Respiratory: Normal respiratory effort, no problems with respiration noted  Abdomen: Soft, gravid, appropriate for gestational age.  Pain/Pressure: Present     Pelvic: Cervical exam deferred        Extremities: Normal range of motion.     Mental Status: Normal mood and affect. Normal behavior. Normal judgment and thought content.   Assessment and Plan:  Pregnancy: [id number] at [redacted]w[redacted]d 1. H/O cesarean section Repeat at 39 weeks, no BTL  2. Supervision of other normal pregnancy, antepartum Nl BP, checks at home  3. GBS bacteriuria Prophylaxis prn labor  4. Genital herpes affecting pregnancy in third trimester On Valtrex for prophylaxis  Term labor symptoms  and general obstetric precautions including but not limited to vaginal bleeding, contractions, leaking of fluid and fetal movement were reviewed in detail with the patient. Please refer to After Visit Summary for other counseling recommendations.   Return in about 1 week (around 09/05/2019) for virtual ok.  Future Appointments  Date Time Provider Department Center  09/05/2019  2:15 PM Constant, Gigi Gin, MD [facility] None  09/10/2019  8:40 AM [facility] 1 [facility] None    Scheryl Darter, MD

## 2019-08-29 NOTE — Patient Instructions (Signed)
Cesarean Delivery Cesarean birth, or cesarean delivery, is the surgical delivery of a baby through an incision in the abdomen and the uterus. This may be referred to as a C-section. This procedure may be scheduled ahead of time, or it may be done in an emergency situation. Tell a health care provider about:  Any allergies you have.  All medicines you are taking, including vitamins, herbs, eye drops, creams, and over-the-counter medicines.  Any problems you or family members have had with anesthetic medicines.  Any blood disorders you have.  Any surgeries you have had.  Any medical conditions you have.  Whether you or any members of your family have a history of deep vein thrombosis (DVT) or pulmonary embolism (PE). What are the risks? Generally, this is a safe procedure. However, problems may occur, including:  Infection.  Bleeding.  Allergic reactions to medicines.  Damage to other structures or organs.  Blood clots.  Injury to your baby. What happens before the procedure? General instructions  Follow instructions from your health care provider about eating or drinking restrictions.  If you know that you are going to have a cesarean delivery, do not shave your pubic area. Shaving before the procedure may increase your risk of infection.  Plan to have someone take you home from the hospital.  Ask your health care provider what steps will be taken to prevent infection. These may include: ? Removing hair at the surgery site. ? Washing skin with a germ-killing soap. ? Taking antibiotic medicine.  Depending on the reason for your cesarean delivery, you may have a physical exam or additional testing, such as an ultrasound.  You may have your blood or urine tested. Questions for your health care provider  Ask your health care provider about: ? Changing or stopping your regular medicines. This is especially important if you are taking diabetes medicines or blood  thinners. ? Your pain management plan. This is especially important if you plan to breastfeed your baby. ? How long you will be in the hospital after the procedure. ? Any concerns you may have about receiving blood products, if you need them during the procedure. ? Cord blood banking, if you plan to collect your baby's umbilical cord blood.  You may also want to ask your health care provider: ? Whether you will be able to hold or breastfeed your baby while you are still in the operating room. ? Whether your baby can stay with you immediately after the procedure and during your recovery. ? Whether a family member or a person of your choice can go with you into the operating room and stay with you during the procedure, immediately after the procedure, and during your recovery. What happens during the procedure?   An IV will be inserted into one of your veins.  Fluid and medicines, such as antibiotics, will be given before the surgery.  Fetal monitors will be placed on your abdomen to check your baby's heart rate.  You may be given a special warming gown to wear to keep your temperature stable.  A catheter may be inserted into your bladder through your urethra. This drains your urine during the procedure.  You may be given one or more of the following: ? A medicine to numb the area (local anesthetic). ? A medicine to make you fall asleep (general anesthetic). ? A medicine (regional anesthetic) that is injected into your back or through a small thin tube placed in your back (spinal anesthetic or epidural anesthetic).   This numbs everything below the injection site and allows you to stay awake during your procedure. If this makes you feel nauseous, tell your health care provider. Medicines will be available to help reduce any nausea you may feel.  An incision will be made in your abdomen, and then in your uterus.  If you are awake during your procedure, you may feel tugging and pulling in  your abdomen, but you should not feel pain. If you feel pain, tell your health care provider immediately.  Your baby will be removed from your uterus. You may feel more pressure or pushing while this happens.  Immediately after birth, your baby will be dried and kept warm. You may be able to hold and breastfeed your baby.  The umbilical cord may be clamped and cut during this time. This usually occurs after waiting a period of 1-2 minutes after delivery.  Your placenta will be removed from your uterus.  Your incisions will be closed with stitches (sutures). Staples, skin glue, or adhesive strips may also be applied to the incision in your abdomen.  Bandages (dressings) may be placed over the incision in your abdomen. The procedure may vary among health care providers and hospitals. What happens after the procedure?  Your blood pressure, heart rate, breathing rate, and blood oxygen level will be monitored until you are discharged from the hospital.  You may continue to receive fluids and medicines through an IV.  You will have some pain. Medicines will be available to help control your pain.  To help prevent blood clots: ? You may be given medicines. ? You may have to wear compression stockings or devices. ? You will be encouraged to walk around when you are able.  Hospital staff will encourage and support bonding with your baby. Your hospital may have you and your baby to stay in the same room (rooming in) during your hospital stay to encourage successful bonding and breastfeeding.  You may be encouraged to cough and breathe deeply often. This helps to prevent lung problems.  If you have a catheter draining your urine, it will be removed as soon as possible after your procedure. Summary  Cesarean birth, or cesarean delivery, is the surgical delivery of a baby through an incision in the abdomen and the uterus.  Follow instructions from your health care provider about eating or  drinking restrictions before the procedure.  You will have some pain after the procedure. Medicines will be available to help control your pain.  Hospital staff will encourage and support bonding with your baby after the procedure. Your hospital may have you and your baby to stay in the same room (rooming in) during your hospital stay to encourage successful bonding and breastfeeding. This information is not intended to replace advice given to you by your health care provider. Make sure you discuss any questions you have with your health care provider. Document Revised: 01/02/2018 Document Reviewed: 01/02/2018 Elsevier Patient Education  2020 Elsevier Inc.  

## 2019-08-30 NOTE — Patient Instructions (Addendum)
Khrista Braun  08/30/2019   Your procedure is scheduled on:  09/12/2019  Arrive at 0730 at [employer] [facility] on [address] at [facility]  and [employer]. You are invited to use the FREE valet parking or use the Visitor's parking deck.  Pick up the phone at the desk and dial [phone].  Call this number if you have problems the morning of surgery: [phone]  Remember:   Do not eat food:(After Midnight) Desps de medianoche.  Do not drink clear liquids: (After Midnight) Desps de medianoche.  Take these medicines the morning of surgery with A SIP OF WATER:  valtrex   Do not wear jewelry, make-up or nail polish.  Do not wear lotions, powders, or perfumes. Do not wear deodorant.  Do not shave 48 hours prior to surgery.  Do not bring valuables to the hospital.  [facility] is not   responsible for any belongings or valuables brought to the hospital.  Contacts, dentures or bridgework may not be worn into surgery.  Leave suitcase in the car. After surgery it may be brought to your room.  For patients admitted to the hospital, checkout time is 11:00 AM the day of              discharge.      Please read over the following fact sheets that you were given:     Preparing for Surgery

## 2019-08-31 ENCOUNTER — Encounter (HOSPITAL_COMMUNITY): Payer: Self-pay

## 2019-09-04 ENCOUNTER — Other Ambulatory Visit: Payer: Self-pay

## 2019-09-04 ENCOUNTER — Inpatient Hospital Stay (HOSPITAL_COMMUNITY)
Admission: AD | Admit: 2019-09-04 | Discharge: 2019-09-04 | Disposition: A | Discharge status: Home / Self Care | Payer: Medicaid Other | Attending: Obstetrics and Gynecology | Admitting: Obstetrics and Gynecology

## 2019-09-04 ENCOUNTER — Encounter (HOSPITAL_COMMUNITY): Payer: Self-pay | Admitting: Obstetrics and Gynecology

## 2019-09-04 DIAGNOSIS — O479 False labor, unspecified: Secondary | ICD-10-CM

## 2019-09-04 DIAGNOSIS — O471 False labor at or after 37 completed weeks of gestation: Secondary | ICD-10-CM | POA: Diagnosis not present

## 2019-09-04 DIAGNOSIS — Z3689 Encounter for other specified antenatal screening: Secondary | ICD-10-CM

## 2019-09-04 DIAGNOSIS — Z3A37 37 weeks gestation of pregnancy: Secondary | ICD-10-CM | POA: Diagnosis not present

## 2019-09-04 NOTE — Discharge Instructions (Signed)
Braxton Hicks Contractions °Contractions of the uterus can occur throughout pregnancy, but they are not always a sign that you are in labor. You may have practice contractions called Braxton Hicks contractions. These false labor contractions are sometimes confused with true labor. °What are Braxton Hicks contractions? °Braxton Hicks contractions are tightening movements that occur in the muscles of the uterus before labor. Unlike true labor contractions, these contractions do not result in opening (dilation) and thinning of the cervix. Toward the end of pregnancy (32-34 weeks), Braxton Hicks contractions can happen more often and may become stronger. These contractions are sometimes difficult to tell apart from true labor because they can be very uncomfortable. You should not feel embarrassed if you go to the hospital with false labor. °Sometimes, the only way to tell if you are in true labor is for your health care provider to look for changes in the cervix. The health care provider will do a physical exam and may monitor your contractions. If you are not in true labor, the exam should show that your cervix is not dilating and your water has not broken. °If there are no other health problems associated with your pregnancy, it is completely safe for you to be sent home with false labor. You may continue to have Braxton Hicks contractions until you go into true labor. °How to tell the difference between true labor and false labor °True labor °· Contractions last 30-70 seconds. °· Contractions become very regular. °· Discomfort is usually felt in the top of the uterus, and it spreads to the lower abdomen and low back. °· Contractions do not go away with walking. °· Contractions usually become more intense and increase in frequency. °· The cervix dilates and gets thinner. °False labor °· Contractions are usually shorter and not as strong as true labor contractions. °· Contractions are usually irregular. °· Contractions  are often felt in the front of the lower abdomen and in the groin. °· Contractions may go away when you walk around or change positions while lying down. °· Contractions get weaker and are shorter-lasting as time goes on. °· The cervix usually does not dilate or become thin. °Follow these instructions at home: ° °· Take over-the-counter and prescription medicines only as told by your health care provider. °· Keep up with your usual exercises and follow other instructions from your health care provider. °· Eat and drink lightly if you think you are going into labor. °· If Braxton Hicks contractions are making you uncomfortable: °? Change your position from lying down or resting to walking, or change from walking to resting. °? Sit and rest in a tub of warm water. °? Drink enough fluid to keep your urine pale yellow. Dehydration may cause these contractions. °? Do slow and deep breathing several times an hour. °· Keep all follow-up prenatal visits as told by your health care provider. This is important. °Contact a health care provider if: °· You have a fever. °· You have continuous pain in your abdomen. °Get help right away if: °· Your contractions become stronger, more regular, and closer together. °· You have fluid leaking or gushing from your vagina. °· You pass blood-tinged mucus (bloody show). °· You have bleeding from your vagina. °· You have low back pain that you never had before. °· You feel your baby’s head pushing down and causing pelvic pressure. °· Your baby is not moving inside you as much as it used to. °Summary °· Contractions that occur before labor are   called Braxton Hicks contractions, false labor, or practice contractions. °· Braxton Hicks contractions are usually shorter, weaker, farther apart, and less regular than true labor contractions. True labor contractions usually become progressively stronger and regular, and they become more frequent. °· Manage discomfort from Braxton Hicks contractions  by changing position, resting in a warm bath, drinking plenty of water, or practicing deep breathing. °This information is not intended to replace advice given to you by your health care provider. Make sure you discuss any questions you have with your health care provider. °Document Revised: 06/10/2017 Document Reviewed: 11/11/2016 °Elsevier Patient Education © 2020 Elsevier Inc. ° °

## 2019-09-04 NOTE — OB Triage Provider Note (Signed)
S: Ms. Caria Transue is a 34 y.o. [id number] at [redacted]w[redacted]d  who presents to MAU today for labor evaluation.   Patient presents with closed cervix and contractions every 15 minutes. Patient is for repeat c/s.   Cervical exam by RN:  Dilation: Closed Exam by:: F. Morris, RNC  Fetal Monitoring: Baseline: 125 Variability: Moderate Accelerations: Present Decelerations: One Questionable Prolonged Contractions: Irregular  MDM Discussed patient with RN. NST reviewed.   A: SIUP at [redacted]w[redacted]d  False labor Cat I FT  P: Patient with one questionable deceleration at 0845.  Nurse instructed to monitor for 45 more minutes and report if strip remains Cat II. Strip Cat I and reactive. Discharge home Labor precautions and kick counts included in AVS Patient to follow-up with [facility] as scheduled  Patient may return to MAU as needed or when in labor   Gerrit Heck, [place] 09/04/2019 9:40 AM

## 2019-09-04 NOTE — MAU Note (Signed)
Started contracting yesterday, now every 10-15 min. No bleeding or leaking.  Denies problems with preg.

## 2019-09-05 ENCOUNTER — Telehealth (INDEPENDENT_AMBULATORY_CARE_PROVIDER_SITE_OTHER): Payer: Medicaid Other | Admitting: Obstetrics and Gynecology

## 2019-09-05 ENCOUNTER — Encounter: Payer: Self-pay | Admitting: Obstetrics and Gynecology

## 2019-09-05 DIAGNOSIS — O98313 Other infections with a predominantly sexual mode of transmission complicating pregnancy, third trimester: Secondary | ICD-10-CM

## 2019-09-05 DIAGNOSIS — A6009 Herpesviral infection of other urogenital tract: Secondary | ICD-10-CM

## 2019-09-05 DIAGNOSIS — R8271 Bacteriuria: Secondary | ICD-10-CM

## 2019-09-05 DIAGNOSIS — Z3480 Encounter for supervision of other normal pregnancy, unspecified trimester: Secondary | ICD-10-CM

## 2019-09-05 DIAGNOSIS — Z98891 History of uterine scar from previous surgery: Secondary | ICD-10-CM

## 2019-09-05 DIAGNOSIS — Z3A38 38 weeks gestation of pregnancy: Secondary | ICD-10-CM

## 2019-09-05 DIAGNOSIS — E669 Obesity, unspecified: Secondary | ICD-10-CM

## 2019-09-05 DIAGNOSIS — O99213 Obesity complicating pregnancy, third trimester: Secondary | ICD-10-CM

## 2019-09-05 NOTE — Progress Notes (Signed)
   TELEHEALTH OBSTETRICS PRENATAL VIRTUAL VIDEO VISIT ENCOUNTER NOTE  Provider location: [facility] [place]   I connected with [name] on 09/05/19 at  2:15 PM EST by MyChart Video Encounter at home and verified that I am speaking with the correct person using two identifiers.   I discussed the limitations, risks, security and privacy concerns of performing an evaluation and management service virtually and the availability of in person appointments. I also discussed with the patient that there may be a patient responsible charge related to this service. The patient expressed understanding and agreed to proceed. Subjective:  [name] is a 34 y.o. [id number] at [redacted]w[redacted]d being seen today for ongoing prenatal care.  She is currently monitored for the following issues for this high-risk pregnancy and has ASTHMA; GBS bacteriuria; H/O cesarean section; Supervision of other normal pregnancy, antepartum; Adopted; Obesity (BMI 30-39.9); Genital herpes affecting pregnancy; Anemia affecting pregnancy; Alpha thalassemia silent carrier; Traumatic injury during pregnancy in third trimester; and NST (non-stress test) reactive on their problem list.  Patient reports irregular contractions.  Contractions: Irregular. Vag. Bleeding: None.  Movement: Present. Denies any leaking of fluid.   The following portions of the patient's history were reviewed and updated as appropriate: allergies, current medications, past family history, past medical history, past social history, past surgical history and problem list.   Objective:  There were no vitals filed for this visit.  Fetal Status:     Movement: Present     General:  Alert, oriented and cooperative. Patient is in no acute distress.  Respiratory: Normal respiratory effort, no problems with respiration noted  Mental Status: Normal mood and affect. Normal behavior. Normal judgment and thought content.  Rest of physical exam deferred due  to type of encounter  Imaging: No results found.  Assessment and Plan:  Pregnancy: [id number] at 101w0d 1. Obesity (BMI 30-39.9)   2. Genital herpes affecting pregnancy in third trimester Continue suppression  3. GBS bacteriuria Scheduled for c-section  4. Supervision of other normal pregnancy, antepartum Patient is doing well without complaints  5. H/O cesarean section Scheduled for repeat 3/3 and is undecided on contractions  Term labor symptoms and general obstetric precautions including but not limited to vaginal bleeding, contractions, leaking of fluid and fetal movement were reviewed in detail with the patient. I discussed the assessment and treatment plan with the patient. The patient was provided an opportunity to ask questions and all were answered. The patient agreed with the plan and demonstrated an understanding of the instructions. The patient was advised to call back or seek an in-person office evaluation/go to MAU at [facility] for any urgent or concerning symptoms. Please refer to After Visit Summary for other counseling recommendations.   I provided 11 minutes of face-to-face time during this encounter.  No follow-ups on file.  Future Appointments  Date Time Provider Department Center  09/05/2019  2:15 PM Jayana Kotula, Gigi Gin, MD [facility] None  09/10/2019  8:40 AM [facility] 1 [facility] None    Catalina Antigua, MD [facility], [facility]

## 2019-09-05 NOTE — Progress Notes (Signed)
Pt is unable to take BP at time of intake.  

## 2019-09-10 ENCOUNTER — Other Ambulatory Visit (HOSPITAL_COMMUNITY)
Admission: RE | Admit: 2019-09-10 | Discharge: 2019-09-10 | Disposition: A | Discharge status: Home / Self Care | Payer: Medicaid Other | Source: Ambulatory Visit | Attending: Obstetrics & Gynecology | Admitting: Obstetrics & Gynecology

## 2019-09-10 ENCOUNTER — Other Ambulatory Visit: Payer: Self-pay

## 2019-09-10 DIAGNOSIS — Z20822 Contact with and (suspected) exposure to covid-19: Secondary | ICD-10-CM | POA: Insufficient documentation

## 2019-09-10 DIAGNOSIS — Z01812 Encounter for preprocedural laboratory examination: Secondary | ICD-10-CM | POA: Insufficient documentation

## 2019-09-10 LAB — CBC
HCT: 35.8 % — ABNORMAL LOW (ref 36.0–46.0)
Hemoglobin: 11.6 g/dL — ABNORMAL LOW (ref 12.0–15.0)
MCH: 28.9 pg (ref 26.0–34.0)
MCHC: 32.4 g/dL (ref 30.0–36.0)
MCV: 89.3 fL (ref 80.0–100.0)
Platelets: 255 10*3/uL (ref 150–400)
RBC: 4.01 MIL/uL (ref 3.87–5.11)
RDW: 15.3 % (ref 11.5–15.5)
WBC: 5.7 10*3/uL (ref 4.0–10.5)
nRBC: 0 % (ref 0.0–0.2)

## 2019-09-10 LAB — RPR: RPR Ser Ql: NONREACTIVE

## 2019-09-10 LAB — ABO/RH: ABO/RH(D): O POS

## 2019-09-10 LAB — SARS CORONAVIRUS 2 (TAT 6-24 HRS): SARS Coronavirus 2: NEGATIVE

## 2019-09-10 NOTE — MAU Note (Signed)
Pt here for PAT covid swab and lab draw. Denies symptoms or sick contacts. Swab collected. 

## 2019-09-12 ENCOUNTER — Inpatient Hospital Stay (HOSPITAL_COMMUNITY): Payer: Medicaid Other | Admitting: Anesthesiology

## 2019-09-12 ENCOUNTER — Inpatient Hospital Stay (HOSPITAL_COMMUNITY)
Admission: RE | Admit: 2019-09-12 | Discharge: 2019-09-14 | DRG: 787 | Disposition: A | Discharge status: Home / Self Care | Payer: Medicaid Other | Attending: Family Medicine | Admitting: Family Medicine

## 2019-09-12 ENCOUNTER — Encounter (HOSPITAL_COMMUNITY): Payer: Self-pay | Admitting: Family Medicine

## 2019-09-12 ENCOUNTER — Encounter (HOSPITAL_COMMUNITY)
Admission: RE | Disposition: A | Discharge status: Home / Self Care | Payer: Self-pay | Source: Home / Self Care | Attending: Family Medicine

## 2019-09-12 ENCOUNTER — Other Ambulatory Visit: Payer: Self-pay

## 2019-09-12 DIAGNOSIS — Z98891 History of uterine scar from previous surgery: Secondary | ICD-10-CM

## 2019-09-12 DIAGNOSIS — O34211 Maternal care for low transverse scar from previous cesarean delivery: Principal | ICD-10-CM | POA: Diagnosis present

## 2019-09-12 DIAGNOSIS — Z3480 Encounter for supervision of other normal pregnancy, unspecified trimester: Secondary | ICD-10-CM

## 2019-09-12 DIAGNOSIS — J45909 Unspecified asthma, uncomplicated: Secondary | ICD-10-CM | POA: Diagnosis present

## 2019-09-12 DIAGNOSIS — O99824 Streptococcus B carrier state complicating childbirth: Secondary | ICD-10-CM | POA: Diagnosis present

## 2019-09-12 DIAGNOSIS — O9952 Diseases of the respiratory system complicating childbirth: Secondary | ICD-10-CM | POA: Diagnosis present

## 2019-09-12 DIAGNOSIS — A6009 Herpesviral infection of other urogenital tract: Secondary | ICD-10-CM | POA: Diagnosis present

## 2019-09-12 DIAGNOSIS — Z3A39 39 weeks gestation of pregnancy: Secondary | ICD-10-CM

## 2019-09-12 DIAGNOSIS — O9832 Other infections with a predominantly sexual mode of transmission complicating childbirth: Secondary | ICD-10-CM | POA: Diagnosis present

## 2019-09-12 DIAGNOSIS — O99214 Obesity complicating childbirth: Secondary | ICD-10-CM | POA: Diagnosis present

## 2019-09-12 DIAGNOSIS — A6000 Herpesviral infection of urogenital system, unspecified: Secondary | ICD-10-CM | POA: Diagnosis present

## 2019-09-12 DIAGNOSIS — O98319 Other infections with a predominantly sexual mode of transmission complicating pregnancy, unspecified trimester: Secondary | ICD-10-CM | POA: Diagnosis present

## 2019-09-12 DIAGNOSIS — R8271 Bacteriuria: Secondary | ICD-10-CM | POA: Diagnosis present

## 2019-09-12 DIAGNOSIS — D563 Thalassemia minor: Secondary | ICD-10-CM | POA: Diagnosis present

## 2019-09-12 LAB — PREPARE RBC (CROSSMATCH)

## 2019-09-12 SURGERY — Surgical Case
Anesthesia: Spinal | Site: Abdomen | Wound class: Clean Contaminated

## 2019-09-12 MED ORDER — DIPHENHYDRAMINE HCL 50 MG/ML IJ SOLN
12.5000 mg | INTRAMUSCULAR | Status: DC | PRN
  Administered 2019-09-12: 12.5 mg via INTRAVENOUS
  Filled 2019-09-12: qty 1

## 2019-09-12 MED ORDER — BUPIVACAINE IN DEXTROSE 0.75-8.25 % IT SOLN
INTRATHECAL | Status: DC | PRN
  Administered 2019-09-12: 2 mL via INTRATHECAL

## 2019-09-12 MED ORDER — SENNOSIDES-DOCUSATE SODIUM 8.6-50 MG PO TABS
2.0000 | ORAL_TABLET | ORAL | Status: DC
  Administered 2019-09-12 – 2019-09-14 (×2): 2 via ORAL
  Filled 2019-09-12 (×2): qty 2

## 2019-09-12 MED ORDER — MENTHOL 3 MG MT LOZG: 1.0000 | LOZENGE | OROMUCOSAL | Status: DC | PRN

## 2019-09-12 MED ORDER — IBUPROFEN 800 MG PO TABS
800.0000 mg | ORAL_TABLET | Freq: Three times a day (TID) | ORAL | Status: DC
  Administered 2019-09-13 – 2019-09-14 (×3): 800 mg via ORAL
  Filled 2019-09-12 (×3): qty 1

## 2019-09-12 MED ORDER — MORPHINE SULFATE (PF) 0.5 MG/ML IJ SOLN
INTRAMUSCULAR | Status: DC | PRN
  Administered 2019-09-12: .15 mg via EPIDURAL

## 2019-09-12 MED ORDER — NALOXONE HCL 4 MG/10ML IJ SOLN
1.0000 ug/kg/h | INTRAVENOUS | Status: DC | PRN
  Filled 2019-09-12: qty 5

## 2019-09-12 MED ORDER — COCONUT OIL OIL: 1.0000 "application " | TOPICAL_OIL | Status: DC | PRN

## 2019-09-12 MED ORDER — SIMETHICONE 80 MG PO CHEW
80.0000 mg | CHEWABLE_TABLET | ORAL | Status: DC
  Administered 2019-09-12 – 2019-09-14 (×2): 80 mg via ORAL
  Filled 2019-09-12 (×2): qty 1

## 2019-09-12 MED ORDER — ONDANSETRON HCL 4 MG/2ML IJ SOLN: 4.0000 mg | Freq: Once | INTRAMUSCULAR | Status: DC | PRN

## 2019-09-12 MED ORDER — ONDANSETRON HCL 4 MG/2ML IJ SOLN: 4.0000 mg | Freq: Three times a day (TID) | INTRAMUSCULAR | Status: DC | PRN

## 2019-09-12 MED ORDER — LACTATED RINGERS IV SOLN: INTRAVENOUS | Status: DC | PRN

## 2019-09-12 MED ORDER — DIPHENHYDRAMINE HCL 50 MG/ML IJ SOLN
INTRAMUSCULAR | Status: AC
  Filled 2019-09-12: qty 1

## 2019-09-12 MED ORDER — NALBUPHINE HCL 10 MG/ML IJ SOLN: 5.0000 mg | Freq: Once | INTRAMUSCULAR | Status: DC | PRN

## 2019-09-12 MED ORDER — MORPHINE SULFATE (PF) 0.5 MG/ML IJ SOLN
INTRAMUSCULAR | Status: AC
  Filled 2019-09-12: qty 10

## 2019-09-12 MED ORDER — NALBUPHINE HCL 10 MG/ML IJ SOLN
5.0000 mg | Freq: Once | INTRAMUSCULAR | Status: DC | PRN
  Filled 2019-09-12: qty 1

## 2019-09-12 MED ORDER — LACTATED RINGERS IV SOLN: 120.0000 mL/h | INTRAVENOUS | Status: DC

## 2019-09-12 MED ORDER — STERILE WATER FOR IRRIGATION IR SOLN
Status: DC | PRN
  Administered 2019-09-12: 1000 mL

## 2019-09-12 MED ORDER — OXYTOCIN 40 UNITS IN NORMAL SALINE INFUSION - SIMPLE MED
INTRAVENOUS | Status: AC
  Filled 2019-09-12: qty 1000

## 2019-09-12 MED ORDER — HYDROCODONE-ACETAMINOPHEN 5-325 MG PO TABS
1.0000 | ORAL_TABLET | ORAL | Status: DC | PRN
  Administered 2019-09-13 – 2019-09-14 (×3): 2 via ORAL
  Administered 2019-09-14: 1 via ORAL
  Filled 2019-09-12 (×3): qty 2
  Filled 2019-09-12: qty 1

## 2019-09-12 MED ORDER — SODIUM CHLORIDE 0.9% FLUSH: 3.0000 mL | INTRAVENOUS | Status: DC | PRN

## 2019-09-12 MED ORDER — ENOXAPARIN SODIUM 60 MG/0.6ML ~~LOC~~ SOLN
50.0000 mg | SUBCUTANEOUS | Status: DC
  Filled 2019-09-12 (×2): qty 0.6

## 2019-09-12 MED ORDER — LACTATED RINGERS IV SOLN: INTRAVENOUS | Status: DC

## 2019-09-12 MED ORDER — LIDOCAINE HCL 1 % IJ SOLN
INTRAMUSCULAR | Status: AC
  Filled 2019-09-12: qty 20

## 2019-09-12 MED ORDER — NALBUPHINE HCL 10 MG/ML IJ SOLN: 5.0000 mg | INTRAMUSCULAR | Status: DC | PRN

## 2019-09-12 MED ORDER — KETOROLAC TROMETHAMINE 30 MG/ML IJ SOLN
30.0000 mg | Freq: Once | INTRAMUSCULAR | Status: AC | PRN
  Administered 2019-09-12: 30 mg via INTRAVENOUS

## 2019-09-12 MED ORDER — ONDANSETRON HCL 4 MG/2ML IJ SOLN
INTRAMUSCULAR | Status: DC | PRN
  Administered 2019-09-12: 4 mg via INTRAVENOUS

## 2019-09-12 MED ORDER — PHENYLEPHRINE HCL-NACL 20-0.9 MG/250ML-% IV SOLN
INTRAVENOUS | Status: AC
  Filled 2019-09-12: qty 250

## 2019-09-12 MED ORDER — SIMETHICONE 80 MG PO CHEW: 80.0000 mg | CHEWABLE_TABLET | ORAL | Status: DC | PRN

## 2019-09-12 MED ORDER — SODIUM CHLORIDE 0.9% IV SOLUTION: Freq: Once | INTRAVENOUS | Status: DC

## 2019-09-12 MED ORDER — NALBUPHINE HCL 10 MG/ML IJ SOLN
5.0000 mg | INTRAMUSCULAR | Status: DC | PRN
  Administered 2019-09-12 – 2019-09-13 (×2): 5 mg via INTRAVENOUS
  Filled 2019-09-12: qty 1

## 2019-09-12 MED ORDER — MEPERIDINE HCL 25 MG/ML IJ SOLN: 6.2500 mg | INTRAMUSCULAR | Status: DC | PRN

## 2019-09-12 MED ORDER — WITCH HAZEL-GLYCERIN EX PADS: 1.0000 "application " | MEDICATED_PAD | CUTANEOUS | Status: DC | PRN

## 2019-09-12 MED ORDER — FENTANYL CITRATE (PF) 100 MCG/2ML IJ SOLN
INTRAMUSCULAR | Status: DC | PRN
  Administered 2019-09-12: 15 ug via INTRAVENOUS

## 2019-09-12 MED ORDER — DEXAMETHASONE SODIUM PHOSPHATE 10 MG/ML IJ SOLN
INTRAMUSCULAR | Status: DC | PRN
  Administered 2019-09-12: 10 mg via INTRAVENOUS

## 2019-09-12 MED ORDER — SODIUM CHLORIDE 0.9 % IV SOLN: INTRAVENOUS | Status: DC | PRN

## 2019-09-12 MED ORDER — PRENATAL MULTIVITAMIN CH
1.0000 | ORAL_TABLET | Freq: Every day | ORAL | Status: DC
  Administered 2019-09-12 – 2019-09-14 (×3): 1 via ORAL
  Filled 2019-09-12 (×3): qty 1

## 2019-09-12 MED ORDER — OXYCODONE HCL 5 MG PO TABS: 5.0000 mg | ORAL_TABLET | Freq: Once | ORAL | Status: DC | PRN

## 2019-09-12 MED ORDER — OXYTOCIN 40 UNITS IN NORMAL SALINE INFUSION - SIMPLE MED
INTRAVENOUS | Status: DC | PRN
  Administered 2019-09-12: 40 [IU] via INTRAVENOUS

## 2019-09-12 MED ORDER — SIMETHICONE 80 MG PO CHEW
80.0000 mg | CHEWABLE_TABLET | Freq: Three times a day (TID) | ORAL | Status: DC
  Administered 2019-09-12 – 2019-09-14 (×4): 80 mg via ORAL
  Filled 2019-09-12 (×4): qty 1

## 2019-09-12 MED ORDER — CEFAZOLIN SODIUM-DEXTROSE 2-4 GM/100ML-% IV SOLN
2.0000 g | Freq: Once | INTRAVENOUS | Status: AC
  Administered 2019-09-12: 10:00:00 2 g via INTRAVENOUS

## 2019-09-12 MED ORDER — SCOPOLAMINE 1 MG/3DAYS TD PT72
MEDICATED_PATCH | TRANSDERMAL | Status: AC
  Filled 2019-09-12: qty 1

## 2019-09-12 MED ORDER — ONDANSETRON HCL 4 MG/2ML IJ SOLN
INTRAMUSCULAR | Status: AC
  Filled 2019-09-12: qty 2

## 2019-09-12 MED ORDER — PHENYLEPHRINE HCL-NACL 20-0.9 MG/250ML-% IV SOLN
INTRAVENOUS | Status: DC | PRN
  Administered 2019-09-12: 60 ug/min via INTRAVENOUS

## 2019-09-12 MED ORDER — FENTANYL CITRATE (PF) 100 MCG/2ML IJ SOLN: 25.0000 ug | INTRAMUSCULAR | Status: DC | PRN

## 2019-09-12 MED ORDER — NALOXONE HCL 0.4 MG/ML IJ SOLN: 0.4000 mg | INTRAMUSCULAR | Status: DC | PRN

## 2019-09-12 MED ORDER — DIBUCAINE (PERIANAL) 1 % EX OINT: 1.0000 "application " | TOPICAL_OINTMENT | CUTANEOUS | Status: DC | PRN

## 2019-09-12 MED ORDER — DIPHENHYDRAMINE HCL 25 MG PO CAPS: 25.0000 mg | ORAL_CAPSULE | Freq: Four times a day (QID) | ORAL | Status: DC | PRN

## 2019-09-12 MED ORDER — OXYCODONE HCL 5 MG/5ML PO SOLN: 5.0000 mg | Freq: Once | ORAL | Status: DC | PRN

## 2019-09-12 MED ORDER — SCOPOLAMINE 1 MG/3DAYS TD PT72
1.0000 | MEDICATED_PATCH | Freq: Once | TRANSDERMAL | Status: DC
  Administered 2019-09-12: 1.5 mg via TRANSDERMAL

## 2019-09-12 MED ORDER — OXYTOCIN 40 UNITS IN NORMAL SALINE INFUSION - SIMPLE MED: 2.5000 [IU]/h | INTRAVENOUS | Status: AC

## 2019-09-12 MED ORDER — SODIUM CHLORIDE 0.9 % IR SOLN
Status: DC | PRN
  Administered 2019-09-12: 1000 mL

## 2019-09-12 MED ORDER — KETOROLAC TROMETHAMINE 30 MG/ML IJ SOLN
INTRAMUSCULAR | Status: AC
  Filled 2019-09-12: qty 1

## 2019-09-12 MED ORDER — CEFAZOLIN SODIUM-DEXTROSE 2-4 GM/100ML-% IV SOLN
INTRAVENOUS | Status: AC
  Filled 2019-09-12: qty 100

## 2019-09-12 MED ORDER — DIPHENHYDRAMINE HCL 25 MG PO CAPS: 25.0000 mg | ORAL_CAPSULE | ORAL | Status: DC | PRN

## 2019-09-12 MED ORDER — FENTANYL CITRATE (PF) 100 MCG/2ML IJ SOLN
INTRAMUSCULAR | Status: AC
  Filled 2019-09-12: qty 2

## 2019-09-12 MED ORDER — KETOROLAC TROMETHAMINE 30 MG/ML IJ SOLN
30.0000 mg | Freq: Four times a day (QID) | INTRAMUSCULAR | Status: AC
  Administered 2019-09-12 – 2019-09-13 (×3): 30 mg via INTRAVENOUS
  Filled 2019-09-12 (×3): qty 1

## 2019-09-12 SURGICAL SUPPLY — 31 items
BENZOIN TINCTURE PRP APPL 2/3 (GAUZE/BANDAGES/DRESSINGS) ×3 IMPLANT
CHLORAPREP W/TINT 26ML (MISCELLANEOUS) ×3 IMPLANT
CLOSURE WOUND 1/2 X4 (GAUZE/BANDAGES/DRESSINGS) ×1
CLOTH BEACON ORANGE TIMEOUT ST (SAFETY) ×3 IMPLANT
DRSG OPSITE POSTOP 4X10 (GAUZE/BANDAGES/DRESSINGS) ×3 IMPLANT
ELECT REM PT RETURN 9FT ADLT (ELECTROSURGICAL) ×3
ELECTRODE REM PT RTRN 9FT ADLT (ELECTROSURGICAL) ×1 IMPLANT
GAUZE SPONGE 4X4 12PLY STRL LF (GAUZE/BANDAGES/DRESSINGS) ×6 IMPLANT
GLOVE BIOGEL PI IND STRL 7.0 (GLOVE) ×3 IMPLANT
GLOVE BIOGEL PI INDICATOR 7.0 (GLOVE) ×6
GLOVE ECLIPSE 6.5 STRL STRAW (GLOVE) ×3 IMPLANT
GOWN STRL REUS W/ TWL LRG LVL3 (GOWN DISPOSABLE) ×2 IMPLANT
GOWN STRL REUS W/TWL LRG LVL3 (GOWN DISPOSABLE) ×6
NS IRRIG 1000ML POUR BTL (IV SOLUTION) ×3 IMPLANT
PAD ABD 8X7 1/2 STERILE (GAUZE/BANDAGES/DRESSINGS) ×3 IMPLANT
PAD OB MATERNITY 4.3X12.25 (PERSONAL CARE ITEMS) ×3 IMPLANT
PAD PREP 24X48 CUFFED NSTRL (MISCELLANEOUS) ×3 IMPLANT
RETRACTOR WND ALEXIS 25 LRG (MISCELLANEOUS) ×1 IMPLANT
RTRCTR WOUND ALEXIS 25CM LRG (MISCELLANEOUS) ×3
STRIP CLOSURE SKIN 1/2X4 (GAUZE/BANDAGES/DRESSINGS) ×2 IMPLANT
SUT PLAIN 2 0 XLH (SUTURE) ×3 IMPLANT
SUT VIC AB 0 CT1 36 (SUTURE) ×6 IMPLANT
SUT VIC AB 0 CTX 36 (SUTURE) ×3
SUT VIC AB 0 CTX36XBRD ANBCTRL (SUTURE) ×1 IMPLANT
SUT VIC AB 2-0 CT1 27 (SUTURE) ×3
SUT VIC AB 2-0 CT1 TAPERPNT 27 (SUTURE) ×1 IMPLANT
SUT VIC AB 4-0 KS 27 (SUTURE) ×3 IMPLANT
TAPE CLOTH SURG 4X10 WHT LF (GAUZE/BANDAGES/DRESSINGS) ×3 IMPLANT
TOWEL OR 17X24 6PK STRL BLUE (TOWEL DISPOSABLE) ×9 IMPLANT
TRAY FOLEY CATH SILVER 16FR (SET/KITS/TRAYS/PACK) ×3 IMPLANT
WATER STERILE IRR 1000ML POUR (IV SOLUTION) ×3 IMPLANT

## 2019-09-12 NOTE — H&P (Signed)
Obstetric Preoperative History and Physical  [name] is a 34 y.o. [id number] with IUP at 41w0dpresenting for presenting for scheduled cesarean section.  No acute concerns.   Prenatal Course Source of Care: [facility]  with onset of care at 9 weeks Pregnancy complications or risks: Patient Active Problem List   Diagnosis Date Noted  . S/P repeat low transverse C-section 09/12/2019  . Traumatic injury during pregnancy in third trimester 07/28/2019  . NST (non-stress test) reactive 07/28/2019  . Alpha thalassemia silent carrier 03/21/2019  . Anemia affecting pregnancy 03/02/2019  . Adopted 02/28/2019  . Obesity (BMI 30-39.9) 02/28/2019  . Genital herpes affecting pregnancy 02/28/2019  . H/O cesarean section 02/20/2019  . Supervision of other normal pregnancy, antepartum 02/20/2019  . GBS bacteriuria 01/28/2019  . ASTHMA 04/12/2007   She plans to breastfeed She desires no method for postpartum contraception.   Prenatal labs and studies: ABO, Rh: --/--/O POS, O POS Performed at [facility] [id number] [place] [address] [phone] Antibody: NEG (03/01 01916 Rubella: 2.86 (08/19 1334) RPR: NON REACTIVE (03/01 0851)  HBsAg: Negative (08/19 1334)  HIV: Non Reactive (12/02 0907)  GBS: POS 2hr Glucola  wnl Genetic screening normal Anatomy UKoreanormal  Prenatal Transfer Tool  Maternal Diabetes: No Genetic Screening: Normal Maternal Ultrasounds/Referrals: Normal Fetal Ultrasounds or other Referrals:  None Maternal Substance Abuse:  No Significant Maternal Medications:  None Significant Maternal Lab Results: Group B Strep positive  Past Medical History:  Diagnosis Date  . Acne   . Anemia   . Chlamydia   . HSV (herpes simplex virus) infection   . Infection    UTI    Past Surgical History:  Procedure Laterality Date  . CESAREAN SECTION    . DILATION AND CURETTAGE OF UTERUS      OB History  Gravida Para Term Preterm AB Living  '6 3 3 ' 0 2 3  SAB  TAB Ectopic Multiple Live Births  0 2 0 0 3    # Outcome Date GA Lbr Len/2nd Weight Sex Delivery Anes PTL Lv  6 Current           5 Term 10/26/10    F CS-LTranv   LIV     Birth Comments: repeat c/section  4 Term 05/17/07    F CS-LTranv   LIV     Birth Comments: repeat c/section,  failed VBAC  3 Term 08/12/05    M CS-LTranv   LIV     Birth Comments: fetal distress  2 TAB      TAB     1 TAB             Social History   Socioeconomic History  . Marital status: Married    Spouse name: Not on file  . Number of children: Not on file  . Years of education: Not on file  . Highest education level: Not on file  Occupational History  . Not on file  Tobacco Use  . Smoking status: Never Smoker  . Smokeless tobacco: Never Used  Substance and Sexual Activity  . Alcohol use: No  . Drug use: No  . Sexual activity: Yes  Other Topics Concern  . Not on file  Social History Narrative  . Not on file   Social Determinants of Health   Financial Resource Strain:   . Difficulty of Paying Living Expenses: Not on file  Food Insecurity:   . Worried About RCharity fundraiser  in the Last Year: Not on file  . Ran Out of Food in the Last Year: Not on file  Transportation Needs:   . Lack of Transportation (Medical): Not on file  . Lack of Transportation (Non-Medical): Not on file  Physical Activity:   . Days of Exercise per Week: Not on file  . Minutes of Exercise per Session: Not on file  Stress:   . Feeling of Stress : Not on file  Social Connections:   . Frequency of Communication with Friends and Family: Not on file  . Frequency of Social Gatherings with Friends and Family: Not on file  . Attends Religious Services: Not on file  . Active Member of Clubs or Organizations: Not on file  . Attends Archivist Meetings: Not on file  . Marital Status: Not on file    Family History  Adopted: Yes  Problem Relation Age of Onset  . Other Neg Hx     Medications Prior to Admission   Medication Sig Dispense Refill Last Dose  . acetaminophen (TYLENOL) 500 MG tablet Take 1,000 mg by mouth every 6 (six) hours as needed for moderate pain.   09/11/2019 at Unknown time  . amoxicillin (AMOXIL) 500 MG capsule Take 500 mg by mouth 3 (three) times daily.   Past Month at Unknown time  . Blood Pressure Monitoring KIT 1 kit by Does not apply route once a week. 1 kit 0 Past Week at Unknown time  . calcium carbonate (TUMS - DOSED IN MG ELEMENTAL CALCIUM) 500 MG chewable tablet Chew 2 tablets by mouth daily as needed for indigestion or heartburn.   Past Week at Unknown time  . Elastic Bandages & Supports (COMFORT FIT MATERNITY SUPP MED) MISC 1 Device by Does not apply route daily. 1 each 0 Past Month at Unknown time  . ferrous sulfate 325 (65 FE) MG tablet Take 1 tablet (325 mg total) by mouth daily. 100 tablet 1 09/11/2019 at Unknown time  . Prenatal Vit-Fe Fumarate-FA (MULTIVITAMIN-PRENATAL) 27-0.8 MG TABS tablet Take 1 tablet by mouth daily at 12 noon. 30 tablet 11 09/11/2019 at Unknown time  . valACYclovir (VALTREX) 500 MG tablet Take 1 tablet (500 mg total) by mouth 2 (two) times daily. 60 tablet 6 09/11/2019 at Unknown time    No Known Allergies  Review of Systems: Negative except for what is mentioned in HPI.  Physical Exam: BP 126/79   Pulse 81   Temp 98.5 F (36.9 C) (Oral)   Resp 18   Ht '5\' 4"'  (1.626 m)   Wt 108.8 kg   LMP 12/13/2018 (Approximate)   SpO2 100%   BMI 41.18 kg/m  FHR by Doppler: 135 bpm CONSTITUTIONAL: Well-developed, well-nourished female in no acute distress.  HENT:  Normocephalic, atraumatic, External right and left ear normal. Oropharynx is clear and moist EYES: Conjunctivae and EOM are normal. Pupils are equal, round, and reactive to light. No scleral icterus.  NECK: Normal range of motion, supple, no masses SKIN: Skin is warm and dry. No rash noted. Not diaphoretic. No erythema. No pallor. [place]: Alert and oriented to person, place, and time. Normal  reflexes, muscle tone coordination. No cranial nerve deficit noted. PSYCHIATRIC: Normal mood and affect. Normal behavior. Normal judgment and thought content. CARDIOVASCULAR: Normal heart rate noted, regular rhythm RESPIRATORY: Effort and breath sounds normal, no problems with respiration noted ABDOMEN: Soft, nontender, nondistended, gravid. Well-healed Pfannenstiel incision. PELVIC: Deferred MUSCULOSKELETAL: Normal range of motion. No edema and no tenderness. 2+ distal pulses.  Pertinent Labs/Studies:   Results for orders placed or performed during the hospital encounter of 09/12/19 (from the past 72 hour(s))  ABO/Rh     Status: None   Collection Time: 09/10/19  8:51 AM  Result Value Ref Range   ABO/RH(D)      O POS Performed at [facility] [address] [place], [place] [address]     Assessment and Plan :[name] is a 34 y.o. [id number] at 70w0dbeing admitted being admitted for scheduled cesarean section. The risks of cesarean section discussed with the patient included but were not limited to: bleeding which may require transfusion or reoperation; infection which may require antibiotics; injury to bowel, bladder, ureters or other surrounding organs; injury to the fetus; need for additional procedures including hysterectomy in the event of a life-threatening hemorrhage; placental abnormalities wth subsequent pregnancies, incisional problems, thromboembolic phenomenon and other postoperative/anesthesia complications. The patient concurred with the proposed plan, giving informed written consent for the procedure. Patient has been NPO since last night she will remain NPO for procedure. Anesthesia and OR aware. Preoperative prophylactic antibiotics and SCDs ordered on call to the OR. To OR when ready.    KCaren Macadam MD, MPH, ABFM Attending Physician Center for [facility]

## 2019-09-12 NOTE — Anesthesia Procedure Notes (Signed)
Spinal  Patient location during procedure: OR Staffing Performed: anesthesiologist  Anesthesiologist: Lucretia Kern, MD Preanesthetic Checklist Completed: patient identified, IV checked, risks and benefits discussed, surgical consent, monitors and equipment checked, pre-op evaluation and timeout performed Spinal Block Patient position: sitting Prep: DuraPrep and site prepped and draped Patient monitoring: continuous pulse ox, blood pressure and heart rate Approach: midline Location: L3-4 Injection technique: single-shot Needle Needle type: Pencan and Tuohy  Needle gauge: 25 G Needle length: 12.7 cm Additional Notes Functioning IV was confirmed and monitors were applied. Sterile prep and drape, including hand hygiene and sterile gloves were used. The patient was positioned and the spine was prepped. The skin was anesthetized with lidocaine.  Free flow of clear CSF was obtained prior to injecting local anesthetic into the CSF. The needle was carefully withdrawn. The patient tolerated the procedure well.

## 2019-09-12 NOTE — Anesthesia Preprocedure Evaluation (Signed)
Anesthesia Evaluation  Patient identified by MRN, date of birth, ID band Patient awake    Reviewed: Allergy & Precautions, H&P , NPO status , Patient's Chart, lab work & pertinent test results  History of Anesthesia Complications Negative for: history of anesthetic complications  Airway Mallampati: II  TM Distance: >3 FB Neck ROM: full    Dental no notable dental hx.    Pulmonary neg pulmonary ROS,    Pulmonary exam normal        Cardiovascular negative cardio ROS Normal cardiovascular exam     Neuro/Psych negative neurological ROS  negative psych ROS   GI/Hepatic negative GI ROS, Neg liver ROS,   Endo/Other  Morbid obesity  Renal/GU negative Renal ROS  negative genitourinary   Musculoskeletal   Abdominal   Peds  Hematology negative hematology ROS (+)   Anesthesia Other Findings 3 prior C/S  Reproductive/Obstetrics (+) Pregnancy                             Anesthesia Physical Anesthesia Plan  ASA: III  Anesthesia Plan: Spinal   Post-op Pain Management:    Induction:   PONV Risk Score and Plan: Ondansetron and Treatment may vary due to age or medical condition  Airway Management Planned:   Additional Equipment:   Intra-op Plan:   Post-operative Plan:   Informed Consent: I have reviewed the patients History and Physical, chart, labs and discussed the procedure including the risks, benefits and alternatives for the proposed anesthesia with the patient or authorized representative who has indicated his/her understanding and acceptance.       Plan Discussed with:   Anesthesia Plan Comments:         Anesthesia Quick Evaluation

## 2019-09-12 NOTE — Transfer of Care (Signed)
Immediate Anesthesia Transfer of Care Note  Patient: [name]  Procedure(s) Performed: CESAREAN SECTION (N/A Abdomen)  Patient Location: PACU  Anesthesia Type:Spinal  Level of Consciousness: awake  Airway & Oxygen Therapy: Patient Spontanous Breathing  Post-op Assessment: Report given to RN  Post vital signs: Reviewed and stable  Last Vitals:  Vitals Value Taken Time  BP 106/86 09/12/19 1120  Temp    Pulse 80 09/12/19 1124  Resp 22 09/12/19 1124  SpO2 100 % 09/12/19 1124  Vitals shown include unvalidated device data.  Last Pain:  Vitals:   09/12/19 0744  TempSrc: Oral  PainSc: 0-No pain      Patients Stated Pain Goal: 4 (09/12/19 0744)  Complications: No apparent anesthesia complications

## 2019-09-12 NOTE — Op Note (Signed)
[name] PROCEDURE DATE: 09/12/2019  PREOPERATIVE DIAGNOSES: Intrauterine pregnancy at [redacted]w[redacted]d weeks gestation; history of four prior cesareans  POSTOPERATIVE DIAGNOSES: The same  PROCEDURE: Repeat Low Transverse Cesarean Section  SURGEON:  Dr. Lyndel Safe  ASSISTANT:  Dr. Merian Capron  ANESTHESIOLOGIST: Dr. Clemens Catholic  INDICATIONS: [name] is a 34 y.o. [id number] at [redacted]w[redacted]d here for repeat cesarean section secondary to the indications listed under preoperative diagnoses; please see preoperative note for further details.  The risks of surgery were discussed with the patient including but were not limited to: bleeding which may require transfusion or reoperation; infection which may require antibiotics; injury to bowel, bladder, ureters or other surrounding organs; injury to the fetus; need for additional procedures including hysterectomy in the event of a life-threatening hemorrhage; placental abnormalities wth subsequent pregnancies, incisional problems, thromboembolic phenomenon and other postoperative/anesthesia complications.  The patient concurred with the proposed plan, giving informed written consent for the procedure.    FINDINGS:  Viable female infant in cephalic OP presentation.  Apgars 9 and 10. Weight 3425 grams. Clear amniotic fluid.  Intact placenta, three vessel cord.  Normal uterus, fallopian tubes and ovaries bilaterally.   ANESTHESIA: Spinal INTRAVENOUS FLUIDS: 2000 ml ESTIMATED BLOOD LOSS: 483 ml URINE OUTPUT:  200 ml SPECIMENS: Placenta sent to L&D COMPLICATIONS: None immediate  PROCEDURE IN DETAIL:  The patient preoperatively received intravenous antibiotics and had sequential compression devices applied to her lower extremities.   She was then taken to the operating room where spinal anesthesia was administered and was found to be adequate. She was then placed in a dorsal supine position with a leftward tilt, and prepped and draped in a sterile manner.  A foley  catheter was placed into her bladder and attached to constant gravity.  After an adequate timeout was performed, a Pfannenstiel skin incision was made with scalpel and carried through to the underlying layer of fascia. The fascia was incised in the midline, and this incision was extended bilaterally using the Mayo scissors.  Kocher clamps were applied to the inferior aspect of the fascial incision and the underlying rectus muscles were dissected off bluntly. The rectus muscles were separated in the midline bluntly and the peritoneum was entered bluntly. Adhesions were taken down sharply with Mayo scissors to help extend the peritoneal incision. Attention was turned to the lower uterine segment where a low transverse hysterotomy was made with a scalpel and extended bilaterally bluntly.  The infant was successfully delivered from LOP position, the cord was clamped and cut and the infant was handed over to awaiting neonatology team. Uterine massage was then administered, and the placenta delivered intact with a three-vessel cord. The uterus was then cleared of clot and debris.  The hysterotomy was closed with 0 Vicryl in a running locked fashion, and an imbricating layer was also placed with 0 Vicryl. The pelvis was cleared of all clot and debris. Hemostasis was confirmed on all surfaces.  The peritoneum was reapproximated using 2-0 Vicryl running suture. The fascia was then closed using 0 Vicryl in a running fashion.  The skin was closed with a 4-0 Vicryl subcuticular stitch. The patient tolerated the procedure well. Sponge, lap, instrument and needle counts were correct x 2.  She was taken to the recovery room in stable condition.   Venora Maples, MD, MPH OB Fellow Faculty Practice- Center for [facility]

## 2019-09-12 NOTE — Anesthesia Postprocedure Evaluation (Signed)
Anesthesia Post Note  Patient: [name]  Procedure(s) Performed: CESAREAN SECTION (N/A Abdomen)     Patient location during evaluation: PACU Anesthesia Type: Spinal Level of consciousness: oriented and awake and alert Pain management: pain level controlled Vital Signs Assessment: post-procedure vital signs reviewed and stable Respiratory status: spontaneous breathing, respiratory function stable and nonlabored ventilation Cardiovascular status: blood pressure returned to baseline and stable Postop Assessment: no headache, no backache, no apparent nausea or vomiting and spinal receding Anesthetic complications: no    Last Vitals:  Vitals:   09/12/19 1310 09/12/19 1420  BP: 125/78 117/74  Pulse: 63 70  Resp: 18 18  Temp: 36.8 C (!) 36.3 C  SpO2: 100% 100%    Last Pain:  Vitals:   09/12/19 1420  TempSrc: Oral  PainSc: 0-No pain   Pain Goal: Patients Stated Pain Goal: 4 (09/12/19 0744)                 Lucretia Kern

## 2019-09-12 NOTE — Lactation Note (Signed)
This note was copied from a baby's chart. Lactation Consultation Note  Patient Name: [name] NIDPO'E Date: 09/12/2019 Reason for consult: Initial assessment;Mother's request;Term  4235 - 2010 - I conducted an initial lactation consult with [name] and her 47 hour old son, [name]. This is her fourth baby; the other children are older (8, 32, and 75). She states that she attempted to breast feed the others, but it didn't work out. She has taken a breast feeding class through [facility] and through an online doula prior to this delivery. She has a personal pump (electric) at home.  [name] states thathe's undecided as to how long she wants to breast feed. She knows that colostrum is beneficial to [name], and she wants to offer that and see how it goes. She has given some formula; her support person gave baby a few mls around 1830 tonight, but he states that baby was too sleepy to eat much.  I offered to assist with breast feeding and she consented. I first demonstrated hand expression while baby was STS on her chest. She has pendulous breast and soft tissue. [name] states that her breasts did not change in pregnancy. Her nipples are a little short, but they evert well with stimulation, and they are compressible.  After we hand expressed, I changed [name]'s meconium diaper. I then placed him in football hold, and I assisted with latching him to the right breast. He latches readily when I "sandwich" her tissue. [name] states that it was a little uncomfortable but not painful (a 2 out of 10 on a pain scale).  I educated at the bedside about output expectations on day 1, feeding frequency, baby's belly size and supplementation amounts for day 1, if indicated, and day 1 infant feeding patterns.  I recommended that she breast feed on demand (cues reviewed) 8-12 times a day and only supplement as needed after offering the breast first. I also taught her support person at the bedside  and encouraged him to help as able.  All questions answered at this time.   Maternal Data Has patient been taught Hand Expression?: Yes Does the patient have breastfeeding experience prior to this delivery?: Yes  Feeding Feeding Type: Breast Fed  LATCH Score Latch: Grasps breast easily, tongue down, lips flanged, rhythmical sucking.  Audible Swallowing: A few with stimulation  Type of Nipple: Everted at rest and after stimulation  Comfort (Breast/Nipple): Soft / non-tender  Hold (Positioning): Assistance needed to correctly position infant at breast and maintain latch.  LATCH Score: 8  Interventions Interventions: Breast feeding basics reviewed;Assisted with latch;Skin to skin;Hand express;Breast compression;Support pillows;Adjust position  Lactation Tools Discussed/Used Date initiated:: 09/12/19   Consult Status Consult Status: Follow-up Date: 09/13/19 Follow-up type: In-patient    Walker Shadow 09/12/2019, 8:18 PM

## 2019-09-12 NOTE — Discharge Summary (Signed)
Postpartum Discharge Summary     Patient Name: [name] DOB: [birth year] MRN: [id number]  Date of admission: 09/12/2019 Delivering Provider: Caren Macadam   Date of discharge: 09/14/19  Admitting diagnosis: S/P repeat low transverse C-section [Z98.891] Intrauterine pregnancy: [redacted]w[redacted]d    Secondary diagnosis:  Active Problems:   Asthma   GBS bacteriuria   H/O cesarean section   Supervision of other normal pregnancy, antepartum   Genital herpes affecting pregnancy   S/P repeat low transverse C-section  Additional problems: none     Discharge diagnosis: Term Pregnancy Delivered                                                                                                Post partum procedures:none  Augmentation: n/a  Complications: None  Hospital course:  Sceduled C/S   34y.o. yo [id number] admitted to the hospital 09/12/2019 for scheduled cesarean section with the following indication:history of three prior cesareans.  Membrane Rupture Time/Date: 10:32 AM ,09/12/2019   Patient delivered a Viable infant.09/12/2019  Details of operation can be found in separate operative note.  Pateint had an uncomplicated postpartum course.  She is ambulating, tolerating a regular diet, passing flatus, and urinating well. Patient is discharged home in stable condition on  09/17/19        Delivery time: 10:32 AM    Magnesium Sulfate received: No BMZ received: No Rhophylac:N/A MMR:N/A Transfusion:No  Physical exam  Vitals:   09/13/19 0207 09/13/19 0447 09/13/19 0801 09/14/19 0547  BP: 102/64 118/70 116/73 110/82  Pulse: 68 66 81 79  Resp: 17   18  Temp: 98.3 F (36.8 C) 97.9 F (36.6 C) 98.3 F (36.8 C) 98 F (36.7 C)  TempSrc: Oral Oral Oral Oral  SpO2: 100% 100% 100%   Weight:      Height:       General: alert, cooperative and no distress Lochia: appropriate Uterine Fundus: firm Incision: Healing well with no significant drainage, No significant erythema,  Dressing is clean, dry, and intact DVT Evaluation: No evidence of DVT seen on physical exam. No cords or calf tenderness. No significant calf/ankle edema. Labs: Lab Results  Component Value Date   WBC 6.3 09/15/2019   HGB 9.8 (L) 09/15/2019   HCT 31.3 (L) 09/15/2019   MCV 92.3 09/15/2019   PLT 232 09/15/2019   CMP Latest Ref Rng & Units 09/15/2019  Glucose 70 - 99 mg/dL 80  BUN 6 - 20 mg/dL <5(L)  Creatinine 0.44 - 1.00 mg/dL 0.57  Sodium 135 - 145 mmol/L 138  Potassium 3.5 - 5.1 mmol/L 3.5  Chloride 98 - 111 mmol/L 104  CO2 22 - 32 mmol/L 24  Calcium 8.9 - 10.3 mg/dL 9.0  Total Protein 6.5 - 8.1 g/dL -  Total Bilirubin 0.3 - 1.2 mg/dL -  Alkaline Phos 38 - 126 U/L -  AST 15 - 41 U/L -  ALT 0 - 44 U/L -   Edinburgh Score: Edinburgh Postnatal Depression Scale Screening Tool 09/14/2019  I have been able to laugh and see the funny side of things. 0  I have looked forward with enjoyment to things. 0  I have blamed myself unnecessarily when things went wrong. 1  I have been anxious or worried for no good reason. 1  I have felt scared or panicky for no good reason. 1  Things have been getting on top of me. 1  I have been so unhappy that I have had difficulty sleeping. 0  I have felt sad or miserable. 1  I have been so unhappy that I have been crying. 0  The thought of harming myself has occurred to me. 0  Edinburgh Postnatal Depression Scale Total 5    Discharge instruction: per After Visit Summary and "Baby and Me Booklet".  After visit meds:  Allergies as of 09/14/2019   No Known Allergies     Medication List    STOP taking these medications   acetaminophen 500 MG tablet Commonly known as: TYLENOL   amoxicillin 500 MG capsule Commonly known as: AMOXIL   Blood Pressure Monitoring Kit   calcium carbonate 500 MG chewable tablet Commonly known as: TUMS - dosed in mg elemental calcium   Comfort Fit Maternity Supp Med Misc   ferrous sulfate 325 (65 FE) MG tablet    valACYclovir 500 MG tablet Commonly known as: VALTREX     TAKE these medications   HYDROcodone-acetaminophen 5-325 MG tablet Commonly known as: NORCO/VICODIN Take 1-2 tablets by mouth every 4 (four) hours as needed for moderate pain.   ibuprofen 800 MG tablet Commonly known as: ADVIL Take 1 tablet (800 mg total) by mouth every 8 (eight) hours.   multivitamin-prenatal 27-0.8 MG Tabs tablet Take 1 tablet by mouth daily at 12 noon.       Diet: routine diet  Activity: Advance as tolerated. Pelvic rest for 6 weeks.   Outpatient follow up:6 weeks Follow up Appt: Future Appointments  Date Time Provider [place]  09/26/2019 10:15 AM [place] None  10/17/2019 10:00 AM Woodroe Mode, MD [place] None   Follow up Visit: [place] Follow up on 10/17/2019.   Specialty: Obstetrics and Gynecology Why: for postpartum appointment Contact information: [address], [place] [phone]           Please schedule this patient for Postpartum visit in: 6 weeks with the following provider: Any provider Virtual For C/S patients schedule nurse incision check in weeks 2 weeks: yes High risk pregnancy complicated by: history of cesarean x3 Delivery mode:  CS Anticipated Birth Control:  Will think about it, options discussed PP Procedures needed: Incision check  Schedule Integrated BH visit: no   Newborn Data: Live born female  Birth Weight:  3425g APGAR: 40, 10  Newborn Delivery   Birth date/time: 09/12/2019 10:32:00 Delivery type: C-Section, Low Transverse Trial of labor: No C-section categorization: Repeat      Baby Feeding: Breast Disposition:home with mother   09/17/2019 Christin Fudge, CNM

## 2019-09-13 ENCOUNTER — Encounter (HOSPITAL_COMMUNITY): Payer: Self-pay | Admitting: Family Medicine

## 2019-09-13 LAB — CBC
HCT: 29.1 % — ABNORMAL LOW (ref 36.0–46.0)
Hemoglobin: 9.3 g/dL — ABNORMAL LOW (ref 12.0–15.0)
MCH: 28.7 pg (ref 26.0–34.0)
MCHC: 32 g/dL (ref 30.0–36.0)
MCV: 89.8 fL (ref 80.0–100.0)
Platelets: 206 10*3/uL (ref 150–400)
RBC: 3.24 MIL/uL — ABNORMAL LOW (ref 3.87–5.11)
RDW: 15.1 % (ref 11.5–15.5)
WBC: 12.4 10*3/uL — ABNORMAL HIGH (ref 4.0–10.5)
nRBC: 0 % (ref 0.0–0.2)

## 2019-09-13 LAB — BIRTH TISSUE RECOVERY COLLECTION (PLACENTA DONATION)

## 2019-09-13 MED ORDER — FERROUS SULFATE 325 (65 FE) MG PO TABS
325.0000 mg | ORAL_TABLET | ORAL | Status: DC
  Administered 2019-09-13: 325 mg via ORAL
  Filled 2019-09-13 (×2): qty 1

## 2019-09-13 NOTE — Progress Notes (Addendum)
Subjective: Postpartum Day 1: Cesarean Delivery Patient reports tolerating PO and + flatus. Breast and bottle feeding. Nuvaring for contraception.  Objective: Vital signs in last 24 hours: Temp:  [97.3 F (36.3 C)-99.3 F (37.4 C)] 98.3 F (36.8 C) (03/04 0801) Pulse Rate:  [63-100] 81 (03/04 0801) Resp:  [13-18] 17 (03/04 0207) BP: (91-129)/(64-95) 116/73 (03/04 0801) SpO2:  [98 %-100 %] 100 % (03/04 0801)  Physical Exam:  General: cooperative and no distress Lochia: appropriate Uterine Fundus: firm Incision: clean and dry DVT Evaluation: No significant calf/ankle edema.  Recent Labs    09/10/19 0851 09/13/19 0440  HGB 11.6* 9.3*  HCT 35.8* 29.1*    Assessment/Plan: Status post Cesarean section. Doing well postoperatively.  Continue current care. Give p.o. iron  [name] 09/13/2019, 8:15 AM  I saw and evaluated the patient. I agree with the findings and the plan of care as documented in the student's note. Foley out and urinating well. Vitals stable. Plan for DC tomorrow if baby can go.  Jerilynn Birkenhead, MD [facility] Family Medicine Fellow, [facility] for [employer], [facility]

## 2019-09-13 NOTE — Lactation Note (Signed)
This note was copied from a baby's chart. Lactation Consultation Note  Patient Name: [name] [id number] Date: 09/13/2019 Reason for consult: Follow-up assessment;1st time breastfeeding;Term  LC completed a follow-up assessment with [name]. FOB was bottle feeding upon LC student entrance into the room. MOB reported that she has been putting baby "[name]" to the breast first and then supplementing with 10-15 mls of formula. MOB shared that baby has a shallow latch and feeds for approximately 10 minutes before falling asleep at the breast. [facility] student showed parents how to paced bottle feed and gently reminded FOB of supplementation guidelines after seeing [name] took 30 mls. FOB and MOB were very receptive and FOB was able to demonstrate paced bottle feeding.  MOB reported that she has a Medela pump and an "Amazon pump" at home. [facility] student encouraged mom to continue hand expressing and asked mom to demonstrate - mom was agreeable. No drops of colostrum were noted. LC student encouraged MOB to begin pumping, mom was interested in getting a DEBP. [facility] student showed parents how to set-up and clean the pump parts. [facility] student encouraged mom to pump every 3 hours/following each of her breast feeds. [facility] student assisted mom with checking the fit of the pump, mom fits a 32mm flange comfortably. MOB wanted to continue with the pump session and was pumping upon [facility] student exit.  [facility] student encouraged mom to continue to feed [place] 8-12x a day on demand. MOB feels comfortable continuing to put him to breast first and then following up with formula supplementation. [facility] student reviewed feeding cues, newborn behavior, supplementation guidelines, and outpatient resources. LC student praised MOB and FOB for their efforts thus far. MOB and FOB reported that they had no further questions or concerns, but they are aware they can call Lactation if needed. [facility] student recommends that Lactation observe a latch prior  to discharge since [name].    Maternal Data Has patient been taught Hand Expression?: Yes(PT was able to demonstrate, not drops noted) Does the patient have breastfeeding experience prior to this delivery?: No  Feeding Feeding Type: Bottle Fed - Formula     Interventions Interventions: Breast feeding basics reviewed;Hand express;DEBP  Lactation Tools Discussed/Used Tools: Bottle Pump Review: Setup, frequency, and cleaning;Milk Storage Initiated by:: LC student, Seaton Hofmann Date initiated:: 09/13/19   Consult Status Consult Status: Follow-up(latch observation needed prior to discharge, MOB commented on shallow latch) Date: 09/14/19 Follow-up type: In-patient    Gregery Na 09/13/2019, 9:13 PM

## 2019-09-13 NOTE — Lactation Note (Signed)
This note was copied from a baby's chart. Lactation Consultation Note  Patient Name: [name] [id number] Date: 09/13/2019   1431 LC attempted to visit with mom. Mom asked for LC to return later, states she wants to take a shower.  [name] 09/13/2019, 2:34 PM

## 2019-09-13 NOTE — Progress Notes (Signed)
Discussed importance of getting out of the bed, ambulating and removing foley catheter with patient. Patient states she is tired and wants to rest before the catheter is taken out and she has to get up.   Tylene Fantasia, RN

## 2019-09-14 LAB — BPAM RBC
Blood Product Expiration Date: 202104012359
Blood Product Expiration Date: 202104012359
Unit Type and Rh: 5100
Unit Type and Rh: 5100

## 2019-09-14 LAB — TYPE AND SCREEN
ABO/RH(D): O POS
Antibody Screen: NEGATIVE
Unit division: 0
Unit division: 0

## 2019-09-14 MED ORDER — IBUPROFEN 800 MG PO TABS: 800.0000 mg | ORAL_TABLET | Freq: Three times a day (TID) | ORAL | 0 refills | Status: DC

## 2019-09-14 MED ORDER — HYDROCODONE-ACETAMINOPHEN 5-325 MG PO TABS: 1.0000 | ORAL_TABLET | ORAL | 0 refills | Status: DC | PRN

## 2019-09-14 NOTE — Lactation Note (Signed)
This note was copied from a baby's chart. Lactation Consultation Note  Patient Name: [name] [id number] Date: 09/14/2019 Reason for consult: Follow-up assessment  P4 mother whose infant is now 51 hours old.  This is a term baby.  Mother does not have breast feeding experience with her other children.    Family has been discharged.  Mother had no questions/concerns related to breast feeding.  She is not certain as to how long she will continue to breast feed.  Mother informed me that she has a manual pump and a DEBP for home use.    She has been taught hand expression and has our OP phone number for questions after discharge.  Father present.   Maternal Data    Feeding    LATCH Score                   Interventions    Lactation Tools Discussed/Used     Consult Status Consult Status: Complete Date: 09/14/19 Follow-up type: Call as needed    [name] R [name] 09/14/2019, 11:47 AM

## 2019-09-14 NOTE — Discharge Instructions (Signed)
Contraception Choices Contraception, also called birth control, refers to methods or devices that prevent pregnancy. Hormonal methods Contraceptive implant  A contraceptive implant is a thin, plastic tube that contains a hormone. It is inserted into the upper part of the arm. It can remain in place for up to 3 years. Progestin-only injections Progestin-only injections are injections of progestin, a synthetic form of the hormone progesterone. They are given every 3 months by a health care provider. Birth control pills  Birth control pills are pills that contain hormones that prevent pregnancy. They must be taken once a day, preferably at the same time each day. Birth control patch  The birth control patch contains hormones that prevent pregnancy. It is placed on the skin and must be changed once a week for three weeks and removed on the fourth week. A prescription is needed to use this method of contraception. Vaginal ring  A vaginal ring contains hormones that prevent pregnancy. It is placed in the vagina for three weeks and removed on the fourth week. After that, the process is repeated with a new ring. A prescription is needed to use this method of contraception. Emergency contraceptive Emergency contraceptives prevent pregnancy after unprotected sex. They come in pill form and can be taken up to 5 days after sex. They work best the sooner they are taken after having sex. Most emergency contraceptives are available without a prescription. This method should not be used as your only form of birth control. Barrier methods Female condom  A female condom is a thin sheath that is worn over the penis during sex. Condoms keep sperm from going inside a woman's body. They can be used with a spermicide to increase their effectiveness. They should be disposed after a single use. Female condom  A female condom is a soft, loose-fitting sheath that is put into the vagina before sex. The condom keeps sperm  from going inside a woman's body. They should be disposed after a single use. Diaphragm  A diaphragm is a soft, dome-shaped barrier. It is inserted into the vagina before sex, along with a spermicide. The diaphragm blocks sperm from entering the uterus, and the spermicide kills sperm. A diaphragm should be left in the vagina for 6-8 hours after sex and removed within 24 hours. A diaphragm is prescribed and fitted by a health care provider. A diaphragm should be replaced every 1-2 years, after giving birth, after gaining more than 15 lb (6.8 kg), and after pelvic surgery. Cervical cap  A cervical cap is a round, soft latex or plastic cup that fits over the cervix. It is inserted into the vagina before sex, along with spermicide. It blocks sperm from entering the uterus. The cap should be left in place for 6-8 hours after sex and removed within 48 hours. A cervical cap must be prescribed and fitted by a health care provider. It should be replaced every 2 years. Sponge  A sponge is a soft, circular piece of polyurethane foam with spermicide on it. The sponge helps block sperm from entering the uterus, and the spermicide kills sperm. To use it, you make it wet and then insert it into the vagina. It should be inserted before sex, left in for at least 6 hours after sex, and removed and thrown away within 30 hours. Spermicides Spermicides are chemicals that kill or block sperm from entering the cervix and uterus. They can come as a cream, jelly, suppository, foam, or tablet. A spermicide should be inserted into the   vagina with an applicator at least 10-15 minutes before sex to allow time for it to work. The process must be repeated every time you have sex. Spermicides do not require a prescription. Intrauterine contraception Intrauterine device (IUD) An IUD is a T-shaped device that is put in a woman's uterus. There are two types:  Hormone IUD.This type contains progestin, a synthetic form of the hormone  progesterone. This type can stay in place for 3-5 years.  Copper IUD.This type is wrapped in copper wire. It can stay in place for 10 years.  Permanent methods of contraception Female tubal ligation In this method, a woman's fallopian tubes are sealed, tied, or blocked during surgery to prevent eggs from traveling to the uterus. Hysteroscopic sterilization In this method, a small, flexible insert is placed into each fallopian tube. The inserts cause scar tissue to form in the fallopian tubes and block them, so sperm cannot reach an egg. The procedure takes about 3 months to be effective. Another form of birth control must be used during those 3 months. Female sterilization This is a procedure to tie off the tubes that carry sperm (vasectomy). After the procedure, the man can still ejaculate fluid (semen). Natural planning methods Natural family planning In this method, a couple does not have sex on days when the woman could become pregnant. Calendar method This means keeping track of the length of each menstrual cycle, identifying the days when pregnancy can happen, and not having sex on those days. Ovulation method In this method, a couple avoids sex during ovulation. Symptothermal method This method involves not having sex during ovulation. The woman typically checks for ovulation by watching changes in her temperature and in the consistency of cervical mucus. Post-ovulation method In this method, a couple waits to have sex until after ovulation. Summary  Contraception, also called birth control, means methods or devices that prevent pregnancy.  Hormonal methods of contraception include implants, injections, pills, patches, vaginal rings, and emergency contraceptives.  Barrier methods of contraception can include female condoms, female condoms, diaphragms, cervical caps, sponges, and spermicides.  There are two types of IUDs (intrauterine devices). An IUD can be put in a woman's uterus to  prevent pregnancy for 3-5 years.  Permanent sterilization can be done through a procedure for males, females, or both.  Natural family planning methods involve not having sex on days when the woman could become pregnant. This information is not intended to replace advice given to you by your health care provider. Make sure you discuss any questions you have with your health care provider. Document Revised: 06/30/2017 Document Reviewed: 07/31/2016 Elsevier Patient Education  2020 [employer]    Postpartum Care After Cesarean Delivery This sheet gives you information about how to care for yourself from the time you deliver your baby to up to 6-12 weeks after delivery (postpartum period). Your health care provider may also give you more specific instructions. If you have problems or questions, contact your health care provider. Follow these instructions at home: Medicines  Take over-the-counter and prescription medicines only as told by your health care provider.  If you were prescribed an antibiotic medicine, take it as told by your health care provider. Do not stop taking the antibiotic even if you start to feel better.  Ask your health care provider if the medicine prescribed to you: ? Requires you to avoid driving or using heavy machinery. ? Can cause constipation. You may need to take actions to prevent or treat constipation, such as:  Drink enough fluid to keep your urine pale yellow.  Take over-the-counter or prescription medicines.  Eat foods that are high in fiber, such as beans, whole grains, and fresh fruits and vegetables.  Limit foods that are high in fat and processed sugars, such as fried or sweet foods. Activity  Gradually return to your normal activities as told by your health care provider.  Avoid activities that take a lot of effort and energy (are strenuous) until approved by your health care provider. Walking at a slow to moderate pace is usually safe. Ask your  health care provider what activities are safe for you. ? Do not lift anything that is heavier than your baby or 10 lb (4.5 kg) as told by your health care provider. ? Do not vacuum, climb stairs, or drive a car for as long as told by your health care provider.  If possible, have someone help you at home until you are able to do your usual activities yourself.  Rest as much as possible. Try to rest or take naps while your baby is sleeping. Vaginal bleeding  It is normal to have vaginal bleeding (lochia) after delivery. Wear a sanitary pad to absorb vaginal bleeding and discharge. ? During the first week after delivery, the amount and appearance of lochia is often similar to a menstrual period. ? Over the next few weeks, it will gradually decrease to a dry, yellow-brown discharge. ? For most women, lochia stops completely by 4-6 weeks after delivery. Vaginal bleeding can vary from woman to woman.  Change your sanitary pads frequently. Watch for any changes in your flow, such as: ? A sudden increase in volume. ? A change in color. ? Large blood clots.  If you pass a blood clot, save it and call your health care provider to discuss. Do not flush blood clots down the toilet before you get instructions from your health care provider.  Do not use tampons or douches until your health care provider says this is safe.  If you are not breastfeeding, your period should return 6-8 weeks after delivery. If you are breastfeeding, your period may return anytime between 8 weeks after delivery and the time that you stop breastfeeding. Perineal care   If your C-section (Cesarean section) was unplanned, and you were allowed to labor and push before delivery, you may have pain, swelling, and discomfort of the tissue between your vaginal opening and your anus (perineum). You may also have an incision in the tissue (episiotomy) or the tissue may have torn during delivery. Follow these instructions as told by  your health care provider: ? Keep your perineum clean and dry as told by your health care provider. Use medicated pads and pain-relieving sprays and creams as directed. ? If you have an episiotomy or vaginal tear, check the area every day for signs of infection. Check for:  Redness, swelling, or pain.  Fluid or blood.  Warmth.  Pus or a bad smell. ? You may be given a squirt bottle to use instead of wiping to clean the perineum area after you go to the bathroom. As you start healing, you may use the squirt bottle before wiping yourself. Make sure to wipe gently. ? To relieve pain caused by an episiotomy, vaginal tear, or hemorrhoids, try taking a warm sitz bath 2-3 times a day. A sitz bath is a warm water bath that is taken while you are sitting down. The water should only come up to your hips and should cover  your buttocks. Breast care  Within the first few days after delivery, your breasts may feel heavy, full, and uncomfortable (breast engorgement). You may also have milk leaking from your breasts. Your health care provider can suggest ways to help relieve breast discomfort. Breast engorgement should go away within a few days.  If you are breastfeeding: ? Wear a bra that supports your breasts and fits you well. ? Keep your nipples clean and dry. Apply creams and ointments as told by your health care provider. ? You may need to use breast pads to absorb milk leakage. ? You may have uterine contractions every time you breastfeed for several weeks after delivery. Uterine contractions help your uterus return to its normal size. ? If you have any problems with breastfeeding, work with your health care provider or a Advertising copywriter.  If you are not breastfeeding: ? Avoid touching your breasts as this can make your breasts produce more milk. ? Wear a well-fitting bra and use cold packs to help with swelling. ? Do not squeeze out (express) milk. This causes you to make more milk. Intimacy  and sexuality  Ask your health care provider when you can engage in sexual activity. This may depend on your: ? Risk of infection. ? Healing rate. ? Comfort and desire to engage in sexual activity.  You are able to get pregnant after delivery, even if you have not had your period. If desired, talk with your health care provider about methods of family planning or birth control (contraception). Lifestyle  Do not use any products that contain nicotine or tobacco, such as cigarettes, e-cigarettes, and chewing tobacco. If you need help quitting, ask your health care provider.  Do not drink alcohol, especially if you are breastfeeding. Eating and drinking   Drink enough fluid to keep your urine pale yellow.  Eat high-fiber foods every day. These may help prevent or relieve constipation. High-fiber foods include: ? Whole grain cereals and breads. ? Brown rice. ? Beans. ? Fresh fruits and vegetables.  Take your prenatal vitamins until your postpartum checkup or until your health care provider tells you it is okay to stop. General instructions  Keep all follow-up visits for you and your baby as told by your health care provider. Most women visit their health care provider for a postpartum checkup within the first 3-6 weeks after delivery. Contact a health care provider if you:  Feel unable to cope with the changes that a new baby brings to your life, and these feelings do not go away.  Feel unusually sad or worried.  Have breasts that are painful, hard, or turn red.  Have a fever.  Have trouble holding urine or keeping urine from leaking.  Have little or no interest in activities you used to enjoy.  Have not breastfed at all and you have not had a menstrual period for 12 weeks after delivery.  Have stopped breastfeeding and you have not had a menstrual period for 12 weeks after you stopped breastfeeding.  Have questions about caring for yourself or your baby.  Pass a blood  clot from your vagina. Get help right away if you:  Have chest pain.  Have difficulty breathing.  Have sudden, severe leg pain.  Have severe pain or cramping in your abdomen.  Bleed from your vagina so much that you fill more than one sanitary pad in one hour. Bleeding should not be heavier than your heaviest period.  Develop a severe headache.  Faint.  Have blurred  vision or spots in your vision.  Have a bad-smelling vaginal discharge.  Have thoughts about hurting yourself or your baby. If you ever feel like you may hurt yourself or others, or have thoughts about taking your own life, get help right away. You can go to your nearest emergency department or call:  Your local emergency services (911 in the [place]  A suicide crisis helpline, such as the National Suicide Prevention Lifeline at 670-834-5249. This is open 24 hours a day. Summary  The period of time from when you deliver your baby to up to 6-12 weeks after delivery is called the postpartum period.  Gradually return to your normal activities as told by your health care provider.  Keep all follow-up visits for you and your baby as told by your health care provider. This information is not intended to replace advice given to you by your health care provider. Make sure you discuss any questions you have with your health care provider. Document Revised: 02/15/2018 Document Reviewed: 02/15/2018 Elsevier Patient Education  2020 [employer].

## 2019-09-14 NOTE — Lactation Note (Signed)
This note was copied from a baby's chart. Lactation Consultation Note Baby 55 hrs old.  Mom is breast/formula feeding. Mom latched baby in football position.  Baby fussy off and on. LC assisted in latching. Mom has short shaft very compressible nipples. Baby finally latched feeding well. Mom stated she has no questions or concerns. Encouraged to pump for stimulation and hand expression after. Encouraged to BF before giving formula. Mom stated she is. Encouraged to call for assistance or questions.  Patient Name: [name] Date: 09/14/2019 Reason for consult: 1st time breastfeeding;Term;Follow-up assessment   Maternal Data Has patient been taught Hand Expression?: Yes Does the patient have breastfeeding experience prior to this delivery?: No  Feeding Feeding Type: Breast Fed Nipple Type: Slow - flow  LATCH Score Latch: Grasps breast easily, tongue down, lips flanged, rhythmical sucking.  Audible Swallowing: None  Type of Nipple: Everted at rest and after stimulation  Comfort (Breast/Nipple): Soft / non-tender  Hold (Positioning): Assistance needed to correctly position infant at breast and maintain latch.  LATCH Score: 7  Interventions Interventions: Breast feeding basics reviewed;Support pillows;Assisted with latch;Skin to skin;Position options;Breast massage;Breast compression;Adjust position  Lactation Tools Discussed/Used WIC Program: No   Consult Status Consult Status: Follow-up Date: 09/15/19 Follow-up type: In-patient    Charyl Dancer 09/14/2019, 3:59 AM

## 2019-09-15 ENCOUNTER — Emergency Department (HOSPITAL_COMMUNITY)
Admission: EM | Admit: 2019-09-15 | Discharge: 2019-09-16 | Disposition: A | Discharge status: Home / Self Care | Payer: Medicaid Other | Attending: Emergency Medicine | Admitting: Emergency Medicine

## 2019-09-15 ENCOUNTER — Other Ambulatory Visit: Payer: Self-pay

## 2019-09-15 ENCOUNTER — Encounter (HOSPITAL_COMMUNITY): Payer: Self-pay | Admitting: *Deleted

## 2019-09-15 DIAGNOSIS — Z7982 Long term (current) use of aspirin: Secondary | ICD-10-CM | POA: Diagnosis not present

## 2019-09-15 DIAGNOSIS — R600 Localized edema: Secondary | ICD-10-CM

## 2019-09-15 DIAGNOSIS — R2243 Localized swelling, mass and lump, lower limb, bilateral: Secondary | ICD-10-CM | POA: Insufficient documentation

## 2019-09-15 DIAGNOSIS — Z79899 Other long term (current) drug therapy: Secondary | ICD-10-CM | POA: Insufficient documentation

## 2019-09-15 DIAGNOSIS — N939 Abnormal uterine and vaginal bleeding, unspecified: Secondary | ICD-10-CM | POA: Insufficient documentation

## 2019-09-15 DIAGNOSIS — R609 Edema, unspecified: Secondary | ICD-10-CM

## 2019-09-15 LAB — BASIC METABOLIC PANEL
Anion gap: 10 (ref 5–15)
BUN: <5 mg/dL — ABNORMAL LOW (ref 6–20)
CO2: 24 mmol/L (ref 22–32)
Calcium: 9 mg/dL (ref 8.9–10.3)
Chloride: 104 mmol/L (ref 98–111)
Creatinine, Ser: 0.57 mg/dL (ref 0.44–1.00)
GFR calc Af Amer: >60 mL/min (ref >60)
GFR calc non Af Amer: >60 mL/min (ref >60)
Glucose, Bld: 80 mg/dL (ref 70–99)
Potassium: 3.5 mmol/L (ref 3.5–5.1)
Sodium: 138 mmol/L (ref 135–145)

## 2019-09-15 LAB — CBC
HCT: 31.3 % — ABNORMAL LOW (ref 36.0–46.0)
Hemoglobin: 9.8 g/dL — ABNORMAL LOW (ref 12.0–15.0)
MCH: 28.9 pg (ref 26.0–34.0)
MCHC: 31.3 g/dL (ref 30.0–36.0)
MCV: 92.3 fL (ref 80.0–100.0)
Platelets: 232 10*3/uL (ref 150–400)
RBC: 3.39 MIL/uL — ABNORMAL LOW (ref 3.87–5.11)
RDW: 15.3 % (ref 11.5–15.5)
WBC: 6.3 10*3/uL (ref 4.0–10.5)
nRBC: 0 % (ref 0.0–0.2)

## 2019-09-15 NOTE — ED Triage Notes (Signed)
C section on Wed, released yesterday. Onset of bilateral leg swelling around noon today. Reports "normal incision pain" and no pregnancy complications.

## 2019-09-15 NOTE — ED Provider Notes (Signed)
MSE was initiated and I personally evaluated the patient and placed orders (if any) at  8:26 PM on September 15, 2019.  Patient presented to the ER for bilateral leg pain and swelling.  She had a C-section 3 days ago, no complications.  Today she developed swelling of bilateral lower extremities.  Pregnancy was not complicated by preeclampsia.  Patient has had multiple previous pregnancies, has never had swelling like this.  No chest pain or shortness of breath.  Discussed with Dr. Despina Hidden from OB/GYN who did not feel patient needed to be transferred to [facility].  Stated this is most likely postdelivery fluid, less likely DVT as it is bilateral.  Consider possible need for HCTZ for swelling.  The patient appears stable so that the remainder of the MSE may be completed by another provider.   Alveria Apley, PA-C 09/15/19 2027    Mancel Bale, MD 09/16/19 2022

## 2019-09-16 NOTE — ED Provider Notes (Signed)
[facility] EMERGENCY DEPARTMENT Provider Note   CSN: [id number] Arrival date & time: 09/15/19  1950     History Chief Complaint  Patient presents with  . Leg Swelling    [name] is a 34 y.o. female.  HPI     This is a 34 year old female status post C-section on 09/12/2019 at 39 weeks and 0 days who presents with lower extremity swelling.  Patient reports that this was her fourth child.  She had a routine repeat C-section.  She has noted bilateral lower extremity swelling since discharge on Friday.  She is not noted any chest pain or shortness of breath.  She states that she has not ever had swelling before.  She denies any leg pain.  She reports "normal C-section pain."  Additionally she denies any incisional drainage.  She states that her bleeding has improved.  She was just concerned given the swelling and no history of the same.  Past Medical History:  Diagnosis Date  . Acne   . Anemia   . Chlamydia   . HSV (herpes simplex virus) infection   . Infection    UTI    Patient Active Problem List   Diagnosis Date Noted  . S/P repeat low transverse C-section 09/12/2019  . Traumatic injury during pregnancy in third trimester 07/28/2019  . NST (non-stress test) reactive 07/28/2019  . Alpha thalassemia silent carrier 03/21/2019  . Anemia affecting pregnancy 03/02/2019  . Adopted 02/28/2019  . Obesity (BMI 30-39.9) 02/28/2019  . Genital herpes affecting pregnancy 02/28/2019  . H/O cesarean section 02/20/2019  . Supervision of other normal pregnancy, antepartum 02/20/2019  . GBS bacteriuria 01/28/2019  . Asthma 04/12/2007    Past Surgical History:  Procedure Laterality Date  . CESAREAN SECTION    . CESAREAN SECTION N/A 09/12/2019   Procedure: CESAREAN SECTION;  Surgeon: Federico Flake, MD;  Location: [facility] LD ORS;  Service: Obstetrics;  Laterality: N/A;  . DILATION AND CURETTAGE OF UTERUS       OB History    Gravida  6   Para  4   Term    4   Preterm  0   AB  2   Living  4     SAB  0   TAB  2   Ectopic  0   Multiple  0   Live Births  4           Family History  Adopted: Yes  Problem Relation Age of Onset  . Other Neg Hx     Social History   Tobacco Use  . Smoking status: Never Smoker  . Smokeless tobacco: Never Used  Substance Use Topics  . Alcohol use: No  . Drug use: No    Home Medications Prior to Admission medications   Medication Sig Start Date End Date Taking? Authorizing Provider  ferrous sulfate 325 (65 FE) MG tablet Take 325 mg by mouth daily with breakfast.   Yes [provider]  HYDROcodone-acetaminophen (NORCO/VICODIN) 5-325 MG tablet Take 1-2 tablets by mouth every 4 (four) hours as needed for moderate pain. 09/14/19  Yes Cresenzo-Dishmon, Scarlette Calico, CNM  ibuprofen (ADVIL) 800 MG tablet Take 1 tablet (800 mg total) by mouth every 8 (eight) hours. 09/14/19  Yes Cresenzo-Dishmon, Scarlette Calico, CNM  Prenatal Vit-Fe Fumarate-FA (MULTIVITAMIN-PRENATAL) 27-0.8 MG TABS tablet Take 1 tablet by mouth daily at 12 noon. 02/20/19  Yes Constant, Gigi Gin, MD    Allergies    Patient has no known allergies.  Review of Systems   Review of Systems  Constitutional: Negative for fever.  Respiratory: Negative for shortness of breath.   Cardiovascular: Positive for leg swelling. Negative for chest pain.  Gastrointestinal: Negative for abdominal pain, nausea and vomiting.  Genitourinary: Positive for vaginal bleeding. Negative for dysuria.  All other systems reviewed and are negative.   Physical Exam Updated Vital Signs BP 121/81 (BP Location: Right Arm)   Pulse 69   Temp 98.4 F (36.9 C) (Oral)   Resp 18   LMP 12/13/2018 (Approximate)   SpO2 100%   Physical Exam Vitals and nursing note reviewed.  Constitutional:      Appearance: She is well-developed. She is obese. She is not ill-appearing.  HENT:     Head: Normocephalic and atraumatic.     Mouth/Throat:     Mouth: Mucous membranes are  moist.  Eyes:     Pupils: Pupils are equal, round, and reactive to light.  Cardiovascular:     Rate and Rhythm: Normal rate and regular rhythm.     Heart sounds: Normal heart sounds.  Pulmonary:     Effort: Pulmonary effort is normal. No respiratory distress.     Breath sounds: No wheezing.  Abdominal:     General: Bowel sounds are normal.     Palpations: Abdomen is soft.     Tenderness: There is abdominal tenderness.     Comments: Slight lower abdominal tenderness palpation, no rebound or guarding, incision clean dry and intact  Musculoskeletal:     Cervical back: Neck supple.     Right lower leg: Edema present.     Left lower leg: Edema present.     Comments: 1+ symmetric bilateral lower extremity edema, no calf tenderness, no overlying skin change  Skin:    General: Skin is warm and dry.  Neurological:     Mental Status: She is alert and oriented to person, place, and time.  Psychiatric:        Mood and Affect: Mood normal.     ED Results / Procedures / Treatments   Labs (all labs ordered are listed, but only abnormal results are displayed) Labs Reviewed  CBC - Abnormal; Notable for the following components:      Result Value   RBC 3.39 (*)    Hemoglobin 9.8 (*)    HCT 31.3 (*)    All other components within normal limits  BASIC METABOLIC PANEL - Abnormal; Notable for the following components:   BUN <5 (*)    All other components within normal limits    EKG None  Radiology No results found.  Procedures Procedures (including critical care time)  Medications Ordered in ED Medications - No data to display  ED Course  I have reviewed the triage vital signs and the nursing notes.  Pertinent labs & imaging results that were available during my care of the patient were reviewed by me and considered in my medical decision making (see chart for details).    MDM Rules/Calculators/A&P                       Patient presents with lower extremity edema following  a C-section 4 days ago.  Overall nontoxic-appearing vital signs are reassuring.  She is 100% on room air.  No shortness of breath or chest pain.  Edema is fairly mild and symmetric.  Low suspicion for DVT or PE.  Low suspicion at this time for postpartum cardiomyopathy.  I reassured the patient.  I have offered her a dose of diuretic to help her diurese.  I have also offered to order an ultrasound study for later today.  Patient states that she "just needed reassurance."  She declined both.  I did discuss with her that if she were to develop chest pain or shortness of breath, this would warrant reevaluation.  Recommend that she keep her legs elevated as much as possible.  This will likely resolve in the next 1 to 2 weeks.  OB/GYN follow-up.  After history, exam, and medical workup I feel the patient has been appropriately medically screened and is safe for discharge home. Pertinent diagnoses were discussed with the patient. Patient was given return precautions.   Final Clinical Impression(s) / ED Diagnoses Final diagnoses:  Peripheral edema    Rx / DC Orders ED Discharge Orders    None       Genifer Lazenby, Mayer Masker, MD 09/16/19 0151

## 2019-09-16 NOTE — Discharge Instructions (Addendum)
You were seen today for bilateral lower extremity swelling.  This is normal after receiving fluids from your C-section.  Monitor closely and elevate your legs when you are able.  This usually resolves within 2 weeks.  If you develop chest pain or shortness of breath or unilateral leg swelling, you need to be reevaluated immediately.  Follow-up with your OB/GYN.

## 2019-09-16 NOTE — ED Notes (Signed)
Discharge instructions reviewed with patient; verbalized understanding. Pt reported some soreness in her abdomen and pt was educated on normal recovery after cesarean sections.

## 2019-09-18 ENCOUNTER — Inpatient Hospital Stay (HOSPITAL_COMMUNITY)
Admission: AD | Admit: 2019-09-18 | Discharge: 2019-09-18 | Disposition: A | Discharge status: Home / Self Care | Payer: Medicaid Other | Attending: Obstetrics and Gynecology | Admitting: Obstetrics and Gynecology

## 2019-09-18 ENCOUNTER — Telehealth: Payer: Self-pay

## 2019-09-18 ENCOUNTER — Encounter (HOSPITAL_COMMUNITY): Payer: Self-pay | Admitting: Obstetrics and Gynecology

## 2019-09-18 ENCOUNTER — Other Ambulatory Visit: Payer: Self-pay

## 2019-09-18 DIAGNOSIS — O1205 Gestational edema, complicating the puerperium: Secondary | ICD-10-CM

## 2019-09-18 DIAGNOSIS — O165 Unspecified maternal hypertension, complicating the puerperium: Secondary | ICD-10-CM | POA: Diagnosis not present

## 2019-09-18 DIAGNOSIS — R030 Elevated blood-pressure reading, without diagnosis of hypertension: Secondary | ICD-10-CM | POA: Diagnosis present

## 2019-09-18 LAB — URINALYSIS, ROUTINE W REFLEX MICROSCOPIC
Bilirubin Urine: NEGATIVE
Glucose, UA: NEGATIVE mg/dL
Ketones, ur: NEGATIVE mg/dL
Nitrite: NEGATIVE
Protein, ur: NEGATIVE mg/dL
Specific Gravity, Urine: 1.01 (ref 1.005–1.030)
pH: 7 (ref 5.0–8.0)

## 2019-09-18 LAB — COMPREHENSIVE METABOLIC PANEL
ALT: 16 U/L (ref 0–44)
AST: 17 U/L (ref 15–41)
Albumin: 2.6 g/dL — ABNORMAL LOW (ref 3.5–5.0)
Alkaline Phosphatase: 94 U/L (ref 38–126)
Anion gap: 8 (ref 5–15)
BUN: <5 mg/dL — ABNORMAL LOW (ref 6–20)
CO2: 23 mmol/L (ref 22–32)
Calcium: 8.9 mg/dL (ref 8.9–10.3)
Chloride: 108 mmol/L (ref 98–111)
Creatinine, Ser: 0.57 mg/dL (ref 0.44–1.00)
GFR calc Af Amer: >60 mL/min (ref >60)
GFR calc non Af Amer: >60 mL/min (ref >60)
Glucose, Bld: 78 mg/dL (ref 70–99)
Potassium: 3.4 mmol/L — ABNORMAL LOW (ref 3.5–5.1)
Sodium: 139 mmol/L (ref 135–145)
Total Bilirubin: 0.3 mg/dL (ref 0.3–1.2)
Total Protein: 6.4 g/dL — ABNORMAL LOW (ref 6.5–8.1)

## 2019-09-18 LAB — CBC
HCT: 30.7 % — ABNORMAL LOW (ref 36.0–46.0)
Hemoglobin: 9.9 g/dL — ABNORMAL LOW (ref 12.0–15.0)
MCH: 28.9 pg (ref 26.0–34.0)
MCHC: 32.2 g/dL (ref 30.0–36.0)
MCV: 89.8 fL (ref 80.0–100.0)
Platelets: 275 10*3/uL (ref 150–400)
RBC: 3.42 MIL/uL — ABNORMAL LOW (ref 3.87–5.11)
RDW: 14.9 % (ref 11.5–15.5)
WBC: 6 10*3/uL (ref 4.0–10.5)
nRBC: 0 % (ref 0.0–0.2)

## 2019-09-18 MED ORDER — HYDROCHLOROTHIAZIDE 12.5 MG PO TABS: 12.5000 mg | ORAL_TABLET | Freq: Every day | ORAL | 0 refills | Status: DC

## 2019-09-18 MED ORDER — ENALAPRIL MALEATE 5 MG PO TABS: 5.0000 mg | ORAL_TABLET | Freq: Every day | ORAL | 0 refills | Status: DC

## 2019-09-18 NOTE — MAU Provider Note (Signed)
Chief Complaint  Patient presents with  . PP BP Evaluation     First Provider Initiated Contact with Patient 09/18/19 1736      S: [name]  is a 34 y.o. y.o. year old [id number] female at 1 week postpartum who presents to MAU with elevated blood pressures. Denies Hx of hypertension. Current blood pressure medication: none.  Had a repeat cesarean delivery last week. Had increase in bilateral lower extremity swelling the last few days. Checked her BP at home today & it was elevated.    Associated symptoms: denies Headache, denies vision changes, denies epigastric pain   O:  Patient Vitals for the past 24 hrs:  BP Temp Temp src Pulse Resp SpO2 Height Weight  09/18/19 1815 129/84 -- -- 70 -- -- -- --  09/18/19 1801 132/70 -- -- 73 -- -- -- --  09/18/19 1745 (!) 145/89 -- -- 70 -- -- -- --  09/18/19 1731 127/76 -- -- 74 -- -- -- --  09/18/19 1716 (!) 152/89 -- -- 74 -- -- -- --  09/18/19 1712 (!) 146/87 -- -- 69 -- -- -- --  09/18/19 1650 129/83 98.9 F (37.2 C) Oral 72 20 100 % 5\' 4"  (1.626 m) 105.4 kg   General: NAD Heart: Regular rate Lungs: Normal rate and effort Abd: Soft, NT, Gravid, S=D Extremities: 2+ pitting Pedal edema Neuro: 2+ deep tendon reflexes, No clonus  Results for orders placed or performed during the hospital encounter of 09/18/19 (from the past 24 hour(s))  Urinalysis, Routine w reflex microscopic     Status: Abnormal   Collection Time: 09/18/19  5:25 PM  Result Value Ref Range   Color, Urine YELLOW YELLOW   APPearance CLEAR CLEAR   Specific Gravity, Urine 1.010 1.005 - 1.030   pH 7.0 5.0 - 8.0   Glucose, UA NEGATIVE NEGATIVE mg/dL   Hgb urine dipstick LARGE (A) NEGATIVE   Bilirubin Urine NEGATIVE NEGATIVE   Ketones, ur NEGATIVE NEGATIVE mg/dL   Protein, ur NEGATIVE NEGATIVE mg/dL   Nitrite NEGATIVE NEGATIVE   Leukocytes,Ua MODERATE (A) NEGATIVE   RBC / HPF 21-50 0 - 5 RBC/hpf   WBC, UA 11-20 0 - 5 WBC/hpf   Bacteria, UA RARE (A) NONE SEEN    Squamous Epithelial / LPF 0-5 0 - 5   Mucus PRESENT   CBC     Status: Abnormal   Collection Time: 09/18/19  5:46 PM  Result Value Ref Range   WBC 6.0 4.0 - 10.5 K/uL   RBC 3.42 (L) 3.87 - 5.11 MIL/uL   Hemoglobin 9.9 (L) 12.0 - 15.0 g/dL   HCT 30.7 (L) 36.0 - 46.0 %   MCV 89.8 80.0 - 100.0 fL   MCH 28.9 26.0 - 34.0 pg   MCHC 32.2 30.0 - 36.0 g/dL   RDW 14.9 11.5 - 15.5 %   Platelets 275 150 - 400 K/uL   nRBC 0.0 0.0 - 0.2 %  Comprehensive metabolic panel     Status: Abnormal   Collection Time: 09/18/19  5:46 PM  Result Value Ref Range   Sodium 139 135 - 145 mmol/L   Potassium 3.4 (L) 3.5 - 5.1 mmol/L   Chloride 108 98 - 111 mmol/L   CO2 23 22 - 32 mmol/L   Glucose, Bld 78 70 - 99 mg/dL   BUN <5 (L) 6 - 20 mg/dL   Creatinine, Ser 0.57 0.44 - 1.00 mg/dL   Calcium 8.9 8.9 - 10.3 mg/dL   Total Protein  6.4 (L) 6.5 - 8.1 g/dL   Albumin 2.6 (L) 3.5 - 5.0 g/dL   AST 17 15 - 41 U/L   ALT 16 0 - 44 U/L   Alkaline Phosphatase 94 38 - 126 U/L   Total Bilirubin 0.3 0.3 - 1.2 mg/dL   GFR calc non Af Amer >60 >60 mL/min   GFR calc Af Amer >60 >60 mL/min   Anion gap 8 5 - 15    MDM Elevated BPs. None severe range & pt asymptomatic. PEC labs normal. Will d/c home on enalapril 5 mg daily. She has a wound check in 1 week, will have BP checked at that visit too.   Pt concerned with LE swelling. Swelling equal bilaterally & no s/s of VTE. Offered patient a few days of diuretic which she would prefer. Will send HCTZ 12.5/day x 3 days.   A:  1. Postpartum hypertension   2. Postpartum edema     P:  Discharge home in stable condition Preeclampsia precautions reviewed Rx enalapril & HCTZ  Judeth Horn, NP 09/18/2019 6:38 PM

## 2019-09-18 NOTE — Telephone Encounter (Signed)
Returned call and pt stated that she is having a lot of swelling in ankles in feet after delivery. Advised pt to take BP and result was elevated at 156/96, Pt advised that BP was normal 2 days ago when she was evaluated 121/81.  Advised pt to be evaluated at hospital.

## 2019-09-18 NOTE — MAU Note (Signed)
Presents for PP BP evaluation.  Reports BP elevated @ home and instructed to go to MAU for further eval.  Denies H/A, visual disturbances, or epigastric pain.  Endorses edema in bilateral extremities.  S/P Repeat Cesarean 09/12/19.

## 2019-09-18 NOTE — Discharge Instructions (Signed)
Postpartum Hypertension Postpartum hypertension is high blood pressure that remains higher than normal after childbirth. You may not realize that you have postpartum hypertension if your blood pressure is not being checked regularly. In most cases, postpartum hypertension will go away on its own, usually within a week of delivery. However, for some women, medical treatment is required to prevent serious complications, such as seizures or stroke. What are the causes? This condition may be caused by one or more of the following:  Hypertension that existed before pregnancy (chronic hypertension).  Hypertension that comes on as a result of pregnancy (gestational hypertension).  Hypertensive disorders during pregnancy (preeclampsia) or seizures in women who have high blood pressure during pregnancy (eclampsia).  A condition in which the liver, platelets, and red blood cells are damaged during pregnancy (HELLP syndrome).  A condition in which the thyroid produces too much hormones (hyperthyroidism).  Other rare problems of the nerves (neurological disorders) or blood disorders. In some cases, the cause may not be known. What increases the risk? The following factors may make you more likely to develop this condition:  Chronic hypertension. In some cases, this may not have been diagnosed before pregnancy.  Obesity.  Type 2 diabetes.  Kidney disease.  History of preeclampsia or eclampsia.  Other medical conditions that change the level of hormones in the body (hormonal imbalance). What are the signs or symptoms? As with all types of hypertension, postpartum hypertension may not have any symptoms. Depending on how high your blood pressure is, you may experience:  Headaches. These may be mild, moderate, or severe. They may also be steady, constant, or sudden in onset (thunderclap headache).  Changes in your ability to see (visual changes).  Dizziness.  Shortness of breath.  Swelling  of your hands, feet, lower legs, or face. In some cases, you may have swelling in more than one of these locations.  Heart palpitations or a racing heartbeat.  Difficulty breathing while lying down.  Decrease in the amount of urine that you pass. Other rare signs and symptoms may include:  Sweating more than usual. This lasts longer than a few days after delivery.  Chest pain.  Sudden dizziness when you get up from sitting or lying down.  Seizures.  Nausea or vomiting.  Abdominal pain. How is this diagnosed? This condition may be diagnosed based on the results of a physical exam, blood pressure measurements, and blood and urine tests. You may also have other tests, such as a CT scan or an MRI, to check for other problems of postpartum hypertension. How is this treated? If blood pressure is high enough to require treatment, your options may include:  Medicines to reduce blood pressure (antihypertensives). Tell your health care provider if you are breastfeeding or if you plan to breastfeed. There are many antihypertensive medicines that are safe to take while breastfeeding.  Stopping medicines that may be causing hypertension.  Treating medical conditions that are causing hypertension.  Treating the complications of hypertension, such as seizures, stroke, or kidney problems. Your health care provider will also continue to monitor your blood pressure closely until it is within a safe range for you. Follow these instructions at home:  Take over-the-counter and prescription medicines only as told by your health care provider.  Return to your normal activities as told by your health care provider. Ask your health care provider what activities are safe for you.  Do not use any products that contain nicotine or tobacco, such as cigarettes and e-cigarettes. If   you need help quitting, ask your health care provider.  Keep all follow-up visits as told by your health care provider. This  is important. Contact a health care provider if:  Your symptoms get worse.  You have new symptoms, such as: ? A headache that does not get better. ? Dizziness. ? Visual changes. Get help right away if:  You suddenly develop swelling in your hands, ankles, or face.  You have sudden, rapid weight gain.  You develop difficulty breathing, chest pain, racing heartbeat, or heart palpitations.  You develop severe pain in your abdomen.  You have any symptoms of a stroke. "BE FAST" is an easy way to remember the main warning signs of a stroke: ? B - Balance. Signs are dizziness, sudden trouble walking, or loss of balance. ? E - Eyes. Signs are trouble seeing or a sudden change in vision. ? F - Face. Signs are sudden weakness or numbness of the face, or the face or eyelid drooping on one side. ? A - Arms. Signs are weakness or numbness in an arm. This happens suddenly and usually on one side of the body. ? S - Speech. Signs are sudden trouble speaking, slurred speech, or trouble understanding what people say. ? T - Time. Time to call emergency services. Write down what time symptoms started.  You have other signs of a stroke, such as: ? A sudden, severe headache with no known cause. ? Nausea or vomiting. ? Seizure. These symptoms may represent a serious problem that is an emergency. Do not wait to see if the symptoms will go away. Get medical help right away. Call your local emergency services (911 in the [place] Do not drive yourself to the hospital. Summary  Postpartum hypertension is high blood pressure that remains higher than normal after childbirth.  In most cases, postpartum hypertension will go away on its own, usually within a week of delivery.  For some women, medical treatment is required to prevent serious complications, such as seizures or stroke. This information is not intended to replace advice given to you by your health care provider. Make sure you discuss any questions  you have with your health care provider. Document Revised: 08/04/2018 Document Reviewed: 04/18/2017 Elsevier Patient Education  2020 [employer]  

## 2019-09-22 ENCOUNTER — Inpatient Hospital Stay (HOSPITAL_COMMUNITY)
Admission: AD | Admit: 2019-09-22 | Discharge: 2019-09-22 | Disposition: A | Discharge status: Home / Self Care | Payer: Medicaid Other | Attending: Obstetrics and Gynecology | Admitting: Obstetrics and Gynecology

## 2019-09-22 ENCOUNTER — Encounter (HOSPITAL_COMMUNITY): Payer: Self-pay | Admitting: Obstetrics and Gynecology

## 2019-09-22 ENCOUNTER — Other Ambulatory Visit: Payer: Self-pay

## 2019-09-22 DIAGNOSIS — O9089 Other complications of the puerperium, not elsewhere classified: Secondary | ICD-10-CM | POA: Insufficient documentation

## 2019-09-22 DIAGNOSIS — L7682 Other postprocedural complications of skin and subcutaneous tissue: Secondary | ICD-10-CM

## 2019-09-22 NOTE — MAU Provider Note (Signed)
History     CSN: [id number]  Arrival date and time: 09/22/19 1023   First Provider Initiated Contact with Patient 09/22/19 1105      Chief Complaint  Patient presents with  . Incision check   34 y.o. G6P4 s/p repeat CS 10 days ago presenting with concerns regarding her incision. Has noticed moisture and odor at the site. Denies drainage or bleeding. Also had some burning pain on the right of the incision that has since resolved. She has not washed the area yet. Denies fever or chills.    OB History    Gravida  6   Para  4   Term  4   Preterm  0   AB  2   Living  4     SAB  0   TAB  2   Ectopic  0   Multiple  0   Live Births  4           Past Medical History:  Diagnosis Date  . Acne   . Anemia   . Chlamydia   . HSV (herpes simplex virus) infection   . Infection    UTI    Past Surgical History:  Procedure Laterality Date  . CESAREAN SECTION    . CESAREAN SECTION N/A 09/12/2019   Procedure: CESAREAN SECTION;  Surgeon: Federico Flake, MD;  Location: [facility] LD ORS;  Service: Obstetrics;  Laterality: N/A;  . DILATION AND CURETTAGE OF UTERUS      Family History  Adopted: Yes  Problem Relation Age of Onset  . Other Neg Hx     Social History   Tobacco Use  . Smoking status: Never Smoker  . Smokeless tobacco: Never Used  Substance Use Topics  . Alcohol use: No  . Drug use: No    Allergies: No Known Allergies  Medications Prior to Admission  Medication Sig Dispense Refill Last Dose  . enalapril (VASOTEC) 5 MG tablet Take 1 tablet (5 mg total) by mouth daily. 30 tablet 0   . ferrous sulfate 325 (65 FE) MG tablet Take 325 mg by mouth daily with breakfast.     . hydrochlorothiazide (HYDRODIURIL) 12.5 MG tablet Take 1 tablet (12.5 mg total) by mouth daily for 3 days. 3 tablet 0   . HYDROcodone-acetaminophen (NORCO/VICODIN) 5-325 MG tablet Take 1-2 tablets by mouth every 4 (four) hours as needed for moderate pain. 30 tablet 0   . ibuprofen  (ADVIL) 800 MG tablet Take 1 tablet (800 mg total) by mouth every 8 (eight) hours. 30 tablet 0   . Prenatal Vit-Fe Fumarate-FA (MULTIVITAMIN-PRENATAL) 27-0.8 MG TABS tablet Take 1 tablet by mouth daily at 12 noon. 30 tablet 11     Review of Systems  Constitutional: Negative for chills and fever.  Gastrointestinal: Negative for abdominal pain.  Skin: Positive for wound.   Physical Exam   Blood pressure 109/70, pulse 87, temperature 98.5 F (36.9 C), temperature source Oral, resp. rate 16, height 5\' 4"  (1.626 m), weight 99 kg, last menstrual period 12/13/2018, SpO2 99 %, unknown if currently breastfeeding.  Physical Exam  Nursing note and vitals reviewed. Constitutional: She is oriented to person, place, and time. She appears well-developed and well-nourished. No distress.  HENT:  Head: Normocephalic and atraumatic.  Cardiovascular: Normal rate.  Respiratory: Effort normal. No respiratory distress.  GI: Soft. She exhibits no distension. There is no abdominal tenderness.  Low transverse incision well approximated and free of erythema, drainage, or bruising; incision underneath panus  Musculoskeletal:        General: Normal range of motion.     Cervical back: Normal range of motion.  Neurological: She is alert and oriented to person, place, and time.  Skin: Skin is warm and dry.  Psychiatric: She has a normal mood and affect.   No results found for this or any previous visit (from the past 24 hour(s)).  MAU Course  Procedures  MDM Incision healing well. Pt reassured. Pt instructed to clean the wound with warm water and towel dry thoroughly. She can then place a clean peripad below panus and change often to keep dry. Instructed to return for worsening pain, drainage from incision, fever. She is stable for discharge.  Assessment and Plan   1. Pain at surgical incision    Discharge home Follow [facility] at [facility] this week as scheduled Return precautions  Allergies as of 09/22/2019    No Known Allergies     Medication List    TAKE these medications   enalapril 5 MG tablet Commonly known as: VASOTEC Take 1 tablet (5 mg total) by mouth daily.   ferrous sulfate 325 (65 FE) MG tablet Take 325 mg by mouth daily with breakfast.   hydrochlorothiazide 12.5 MG tablet Commonly known as: HYDRODIURIL Take 1 tablet (12.5 mg total) by mouth daily for 3 days.   HYDROcodone-acetaminophen 5-325 MG tablet Commonly known as: NORCO/VICODIN Take 1-2 tablets by mouth every 4 (four) hours as needed for moderate pain.   ibuprofen 800 MG tablet Commonly known as: ADVIL Take 1 tablet (800 mg total) by mouth every 8 (eight) hours.   multivitamin-prenatal 27-0.8 MG Tabs tablet Take 1 tablet by mouth daily at 12 noon.      Julianne Handler, CNM 09/22/2019, 11:12 AM

## 2019-09-22 NOTE — Discharge Instructions (Signed)
Cesarean Delivery, Care After This sheet gives you information about how to care for yourself after your procedure. Your health care provider may also give you more specific instructions. If you have problems or questions, contact your health care provider. What can I expect after the procedure? After the procedure, it is common to have:  A small amount of blood or clear fluid coming from the incision.  Some redness, swelling, and pain in your incision area.  Some abdominal pain and soreness.  Vaginal bleeding (lochia). Even though you did not have a vaginal delivery, you will still have vaginal bleeding and discharge.  Pelvic cramps.  Fatigue. You may have pain, swelling, and discomfort in the tissue between your vagina and your anus (perineum) if:  Your C-section was unplanned, and you were allowed to labor and push.  An incision was made in the area (episiotomy) or the tissue tore during attempted vaginal delivery. Follow these instructions at home: Incision care   Follow instructions from your health care provider about how to take care of your incision. Make sure you: ? Wash your hands with soap and water before you change your bandage (dressing). If soap and water are not available, use hand sanitizer. ? If you have a dressing, change it or remove it as told by your health care provider. ? Leave stitches (sutures), skin staples, skin glue, or adhesive strips in place. These skin closures may need to stay in place for 2 weeks or longer. If adhesive strip edges start to loosen and curl up, you may trim the loose edges. Do not remove adhesive strips completely unless your health care provider tells you to do that.  Check your incision area every day for signs of infection. Check for: ? More redness, swelling, or pain. ? More fluid or blood. ? Warmth. ? Pus or a bad smell.  Do not take baths, swim, or use a hot tub until your health care provider says it's okay. Ask your health  care provider if you can take showers.  When you cough or sneeze, hug a pillow. This helps with pain and decreases the chance of your incision opening up (dehiscing). Do this until your incision heals. Medicines  Take over-the-counter and prescription medicines only as told by your health care provider.  If you were prescribed an antibiotic medicine, take it as told by your health care provider. Do not stop taking the antibiotic even if you start to feel better.  Do not drive or use heavy machinery while taking prescription pain medicine. Lifestyle  Do not drink alcohol. This is especially important if you are breastfeeding or taking pain medicine.  Do not use any products that contain nicotine or tobacco, such as cigarettes, e-cigarettes, and chewing tobacco. If you need help quitting, ask your health care provider. Eating and drinking  Drink at least 8 eight-ounce glasses of water every day unless told not to by your health care provider. If you breastfeed, you may need to drink even more water.  Eat high-fiber foods every day. These foods may help prevent or relieve constipation. High-fiber foods include: ? Whole grain cereals and breads. ? Brown rice. ? Beans. ? Fresh fruits and vegetables. Activity   If possible, have someone help you care for your baby and help with household activities for at least a few days after you leave the hospital.  Return to your normal activities as told by your health care provider. Ask your health care provider what activities are safe for   you.  Rest as much as possible. Try to rest or take a nap while your baby is sleeping.  Do not lift anything that is heavier than 10 lbs (4.5 kg), or the limit that you were told, until your health care provider says that it is safe.  Talk with your health care provider about when you can engage in sexual activity. This may depend on your: ? Risk of infection. ? How fast you heal. ? Comfort and desire to  engage in sexual activity. General instructions  Do not use tampons or douches until your health care provider approves.  Wear loose, comfortable clothing and a supportive and well-fitting bra.  Keep your perineum clean and dry. Wipe from front to back when you use the toilet.  If you pass a blood clot, save it and call your health care provider to discuss. Do not flush blood clots down the toilet before you get instructions from your health care provider.  Keep all follow-up visits for you and your baby as told by your health care provider. This is important. Contact a health care provider if:  You have: ? A fever. ? Bad-smelling vaginal discharge. ? Pus or a bad smell coming from your incision. ? Difficulty or pain when urinating. ? A sudden increase or decrease in the frequency of your bowel movements. ? More redness, swelling, or pain around your incision. ? More fluid or blood coming from your incision. ? A rash. ? Nausea. ? Little or no interest in activities you used to enjoy. ? Questions about caring for yourself or your baby.  Your incision feels warm to the touch.  Your breasts turn red or become painful or hard.  You feel unusually sad or worried.  You vomit.  You pass a blood clot from your vagina.  You urinate more than usual.  You are dizzy or light-headed. Get help right away if:  You have: ? Pain that does not go away or get better with medicine. ? Chest pain. ? Difficulty breathing. ? Blurred vision or spots in your vision. ? Thoughts about hurting yourself or your baby. ? New pain in your abdomen or in one of your legs. ? A severe headache.  You faint.  You bleed from your vagina so much that you fill more than one sanitary pad in one hour. Bleeding should not be heavier than your heaviest period. Summary  After the procedure, it is common to have pain at your incision site, abdominal cramping, and slight bleeding from your vagina.  Check  your incision area every day for signs of infection.  Tell your health care provider about any unusual symptoms.  Keep all follow-up visits for you and your baby as told by your health care provider. This information is not intended to replace advice given to you by your health care provider. Make sure you discuss any questions you have with your health care provider. Document Revised: 01/04/2018 Document Reviewed: 01/04/2018 Elsevier Patient Education  2020 [employer] Inc[employer]  

## 2019-09-22 NOTE — [facility] Note (Signed)
[name] is a 34 y.o. here in [facility] reporting: yesterday noticed her incision started burning on the right side and noticed a "funny smell". Had c/s on 09/12/19. States she hasn't washed the area to hard.  Onset of complaint: yesterday  Pain score: 3/10  Vitals:   09/22/19 1050  BP: 109/70  Pulse: 87  Resp: 16  Temp: 98.5 F (36.9 C)  SpO2: 99%      Lab orders placed from triage: none

## 2019-09-26 ENCOUNTER — Other Ambulatory Visit: Payer: Self-pay

## 2019-09-26 ENCOUNTER — Ambulatory Visit (INDEPENDENT_AMBULATORY_CARE_PROVIDER_SITE_OTHER): Payer: Medicaid Other

## 2019-09-26 VITALS — BP 125/73 | HR 68 | Wt 218.5 lb

## 2019-09-26 DIAGNOSIS — Z98891 History of uterine scar from previous surgery: Secondary | ICD-10-CM

## 2019-09-26 DIAGNOSIS — Z7689 Persons encountering health services in other specified circumstances: Secondary | ICD-10-CM

## 2019-09-26 NOTE — Progress Notes (Signed)
Pt is here for incision check. C Section on 09/12/19. Pt reports no complaints or questions today. Incision is healing well, no signs of infection, no drainage, and appropriate approximation. Pt has PP appt scheduled for 10/17/19, advised pt to call if she needs anything before then. Pt voices understanding.

## 2019-10-02 ENCOUNTER — Telehealth: Payer: Self-pay

## 2019-10-02 ENCOUNTER — Other Ambulatory Visit: Payer: Self-pay

## 2019-10-02 DIAGNOSIS — N898 Other specified noninflammatory disorders of vagina: Secondary | ICD-10-CM

## 2019-10-02 MED ORDER — FLUCONAZOLE 150 MG PO TABS: 150.0000 mg | ORAL_TABLET | Freq: Once | ORAL | 0 refills | Status: AC

## 2019-10-02 NOTE — Telephone Encounter (Signed)
Return call to pt w/ complaints of yeast infection sx's Vaginal itching /discharge Pt confirmed not breast feeding  one time diflucan sent per protocol  Pt made aware if no relief in 7-10 days  To make appt for vaginal swab.

## 2019-10-02 NOTE — Progress Notes (Signed)
Rx sent per protocol 

## 2019-10-17 ENCOUNTER — Encounter: Payer: Self-pay | Admitting: Obstetrics & Gynecology

## 2019-10-17 ENCOUNTER — Telehealth (INDEPENDENT_AMBULATORY_CARE_PROVIDER_SITE_OTHER): Payer: Medicaid Other | Admitting: Obstetrics & Gynecology

## 2019-10-17 DIAGNOSIS — Z30018 Encounter for initial prescription of other contraceptives: Secondary | ICD-10-CM

## 2019-10-17 DIAGNOSIS — Z98891 History of uterine scar from previous surgery: Secondary | ICD-10-CM

## 2019-10-17 MED ORDER — ETONOGESTREL-ETHINYL ESTRADIOL 0.12-0.015 MG/24HR VA RING: VAGINAL_RING | VAGINAL | 12 refills | Status: AC

## 2019-10-17 MED ORDER — FLUCONAZOLE 150 MG PO TABS: 150.0000 mg | ORAL_TABLET | Freq: Once | ORAL | 0 refills | Status: AC

## 2019-10-17 NOTE — Patient Instructions (Signed)
Ethinyl Estradiol; Etonogestrel vaginal ring What is this medicine? ETHINYL ESTRADIOL; ETONOGESTREL (ETH in il es tra DYE ole; et oh noe JES trel) vaginal ring is a flexible, vaginal ring used as a contraceptive (birth control method). This medicine combines 2 types of female hormones, an estrogen and a progestin. This ring is used to prevent ovulation and pregnancy. Each ring is effective for 1 month. This medicine may be used for other purposes; ask your health care provider or pharmacist if you have questions. COMMON BRAND NAME(S): EluRyng, NuvaRing What should I tell my health care provider before I take this medicine? They need to know if you have any of these conditions:  abnormal vaginal bleeding  blood vessel disease or blood clots  breast, cervical, endometrial, ovarian, liver, or uterine cancer  diabetes  gallbladder disease  having surgery  heart disease or recent heart attack  high blood pressure  high cholesterol or triglycerides  history of irregular heartbeat or heart valve problems  kidney disease  liver disease  migraine headaches  protein C deficiency  protein S deficiency  recently had a baby, miscarriage, or abortion  stroke  systemic lupus erythematosus (SLE)  tobacco smoker  your age is more than 35 years old  an unusual or allergic reaction to estrogens, progestins, other medicines, foods, dyes, or preservatives  pregnant or trying to get pregnant  breast-feeding How should I use this medicine? Insert the ring into your vagina as directed. Follow the directions on the prescription label. The ring will remain place for 3 weeks and is then removed for a 1-week break. A new ring is inserted 1 week after the last ring was removed, on the same day of the week. Check often to make sure the ring is still in place. If the ring was out of the vagina for an unknown amount of time, you may not be protected from pregnancy. Perform a pregnancy test and  call your doctor. Do not use more often than directed. A patient package insert for the product will be given with each prescription and refill. Read this sheet carefully each time. The sheet may change frequently. Contact your pediatrician regarding the use of this medicine in children. Special care may be needed. Overdosage: If you think you have taken too much of this medicine contact a poison control center or emergency room at once. NOTE: This medicine is only for you. Do not share this medicine with others. What if I miss a dose? You will need to use the ring exactly as directed. It is very important to follow the schedule every cycle. If you do not use the ring as directed, you may not be protected from pregnancy. If the ring should slip out, is lost, or if you leave it in longer or shorter than you should, contact your health care professional for advice. What may interact with this medicine? Do not take this medicine with the following medications:  dasabuvir; ombitasvir; paritaprevir; ritonavir  ombitasvir; paritaprevir; ritonavir  vaginal lubricants or other vaginal products that are oil-based or silicone-based This medicine may also interact with the following medications:  acetaminophen  antibiotics or medicines for infections, especially rifampin, rifabutin, rifapentine, and griseofulvin, and possibly penicillins or tetracyclines  aprepitant or fosaprepitant  armodafinil  ascorbic acid (vitamin C)  barbiturate medicines, such as phenobarbital or primidone  bosentan  certain antiviral medicines for hepatitis, HIV or AIDS  certain medicines for cancer treatment  certain medicines for seizures like carbamazepine, clobazam, felbamate, lamotrigine, oxcarbazepine, phenytoin,   rufinamide, topiramate  certain medicines for treating high cholesterol  cyclosporine  dantrolene  elagolix  flibanserin  grapefruit juice  lesinurad  medicines for diabetes  medicines  to treat fungal infections, such as griseofulvin, miconazole, fluconazole, ketoconazole, itraconazole, posaconazole or voriconazole  mifepristone  mitotane  modafinil  morphine  mycophenolate  St. John's wort  tamoxifen  temazepam  theophylline or aminophylline  thyroid hormones  tizanidine  tranexamic acid  ulipristal  warfarin This list may not describe all possible interactions. Give your health care provider a list of all the medicines, herbs, non-prescription drugs, or dietary supplements you use. Also tell them if you smoke, drink alcohol, or use illegal drugs. Some items may interact with your medicine. What should I watch for while using this medicine? Visit your doctor or health care professional for regular checks on your progress. You will need a regular breast and pelvic exam and Pap smear while on this medicine. Check with your doctor or health care professional to see if you need an additional method of contraception during the first cycle that you use this ring. Female condoms (made with natural rubber latex, polyisoprene, and polyurethane) and spermicides may be used. Do not use a diaphragm, cervical cap, or a female condom, as the ring can interfere with these birth control methods and their proper placement. If you have any reason to think you are pregnant, stop using this medicine right away and contact your doctor or health care professional. If you are using this medicine for hormone related problems, it may take several cycles of use to see improvement in your condition. Smoking increases the risk of getting a blood clot or having a stroke while you are using hormonal birth control, especially if you are more than 35 years old. You are strongly advised not to smoke. Some women are prone to getting dark patches on the skin of the face (cholasma). Your risk of getting chloasma with this medicine is higher if you had chloasma during a pregnancy. Keep out of the  sun. If you cannot avoid being in the sun, wear protective clothing and use sunscreen. Do not use sun lamps or tanning beds/booths. This medicine can make your body retain fluid, making your fingers, hands, or ankles swell. Your blood pressure can go up. Contact your doctor or health care professional if you feel you are retaining fluid. If you are going to have elective surgery, you may need to stop using this medicine before the surgery. Consult your health care professional for advice. This medicine does not protect you against HIV infection (AIDS) or any other sexually transmitted diseases. What side effects may I notice from receiving this medicine? Side effects that you should report to your doctor or health care professional as soon as possible:  allergic reactions such as skin rash or itching, hives, swelling of the lips, mouth, tongue, or throat  depression  high blood pressure  migraines or severe, sudden headaches  signs and symptoms of a blood clot such as breathing problems; changes in vision; chest pain; severe, sudden headache; pain, swelling, warmth in the leg; trouble speaking; sudden numbness or weakness of the face, arm or leg  signs and symptoms of infection like fever or chills with dizziness and a sunburn-like rash, or pain or trouble passing urine  stomach pain  symptoms of vaginal infection like itching, irritation or unusual discharge  yellowing of the eyes or skin Side effects that usually do not require medical attention (report these to your doctor   or health care professional if they continue or are bothersome):  acne  breast pain, tenderness  irregular vaginal bleeding or spotting, particularly during the first month of use  mild headache  nausea  painful periods  vomiting This list may not describe all possible side effects. Call your doctor for medical advice about side effects. You may report side effects to FDA at 1-800-FDA-1088. Where should I  keep my medicine? Keep out of the reach of children. Store unopened medicine for up to 4 months at room temperature at 15 and 30 degrees C (59 and 86 degrees F). Protect from light. Do not store above 30 degrees C (86 degrees F). Throw away any unused medicine 4 months after the dispense date or the expiration date, whichever comes first. A ring may only be used for 1 cycle (1 month). After the 3-week cycle, a used ring is removed and should be placed in the re-closable foil pouch and discarded in the trash out of reach of children and pets. Do NOT flush down the toilet. NOTE: This sheet is a summary. It may not cover all possible information. If you have questions about this medicine, talk to your doctor, pharmacist, or health care provider.  2020 Elsevier/Gold Standard (2019-01-18 12:31:47)  

## 2019-10-17 NOTE — Progress Notes (Signed)
     I connected with@ on 10/17/19 at 10:00 AM EDT by: MyChart and verified that I am speaking with the correct person using two identifiers.  Patient is located at home and provider is located at [place].     The purpose of this virtual visit is to provide medical care while limiting exposure to the novel coronavirus. I discussed the limitations, risks, security and privacy concerns of performing an evaluation and management service by MyChart and the availability of in person appointments. I also discussed with the patient that there may be a patient responsible charge related to this service. By engaging in this virtual visit, you consent to the provision of healthcare.  Additionally, you authorize for your insurance to be billed for the services provided during this visit.  The patient expressed understanding and agreed to proceed.  The following staff members participated in the virtual visit:  Debroah Loop MD, Sherilyn Cooter CMA  Post Partum Visit Note Subjective:   [name] is a 34 y.o. [id number] female being evaluated for postpartum followup.  She is 5 weeks postpartum following a repeat cesarean section at  39 gestational weeks.  I have fully reviewed the prenatal and intrapartum course; pregnancy complicated by obesity.  Postpartum course has been good. Baby is doing well. Baby is feeding by bottle [place]. Bleeding no bleeding. Bowel function is normal. Bladder function is normal. Patient is not sexually active. Contraception method is NuvaRing vaginal inserts. Postpartum depression screening: negative.  The following portions of the patient's history were reviewed and updated as appropriate: allergies, current medications, past family history, past medical history, past social history, past surgical history and problem list.  Review of Systems Pertinent items are noted in HPI.   Objective:  There were no vitals filed for this visit. Self-Obtained       Assessment:    normal postpartum  exam.  Plan:  Essential components of care per ACOG recommendations:  1.  Mood and well being: Patient with negative depression screening today. Reviewed local resources for support.  - Patient does use tobacco. - hx of drug use? No    2. Infant care and feeding:  -Patient currently breastmilk feeding? No  -Social determinants of health (SDOH) reviewed in EPIC. No concerns  3. Sexuality, contraception and birth spacing - Patient does not want a pregnancy in the next year.  Desired family size is 4 children.  - Reviewed forms of contraception in tiered fashion. Patient desired NuvaRing vaginal inserts today.   - Discussed birth spacing of 18 months  4. Sleep and fatigue -Encouraged family/partner/community support of 4 hrs of uninterrupted sleep to help with mood and fatigue  5. Physical Recovery  - Patient has urinary incontinence? No  - Patient is safe to resume physical and sexual activity  6.  Health Maintenance - Last pap smear done 02/2019 and was normal with negative HPV.   7. No Chronic Disease - PCP follow up  12 minutes of non-face-to-face time spent with the patient    Adam Phenix, MD 10/17/2019

## 2019-11-21 ENCOUNTER — Other Ambulatory Visit: Payer: Self-pay

## 2019-11-21 MED ORDER — FLUCONAZOLE 150 MG PO TABS: 150.0000 mg | ORAL_TABLET | Freq: Once | ORAL | 0 refills | Status: AC

## 2019-11-21 NOTE — Progress Notes (Signed)
Rx diflucan sent to pt preferred pharmacy for yeast infection symptoms per protocol Pt made aware.

## 2019-12-31 ENCOUNTER — Other Ambulatory Visit (INDEPENDENT_AMBULATORY_CARE_PROVIDER_SITE_OTHER): Payer: Self-pay

## 2020-01-02 ENCOUNTER — Other Ambulatory Visit: Payer: Self-pay

## 2020-03-24 DIAGNOSIS — F331 Major depressive disorder, recurrent, moderate: Secondary | ICD-10-CM | POA: Diagnosis not present

## 2020-04-07 DIAGNOSIS — F331 Major depressive disorder, recurrent, moderate: Secondary | ICD-10-CM | POA: Diagnosis not present

## 2020-07-23 ENCOUNTER — Other Ambulatory Visit: Payer: Self-pay | Admitting: Obstetrics & Gynecology

## 2020-07-23 DIAGNOSIS — Z30018 Encounter for initial prescription of other contraceptives: Secondary | ICD-10-CM

## 2020-08-26 DIAGNOSIS — Z111 Encounter for screening for respiratory tuberculosis: Secondary | ICD-10-CM | POA: Diagnosis not present

## 2020-08-28 DIAGNOSIS — F331 Major depressive disorder, recurrent, moderate: Secondary | ICD-10-CM | POA: Diagnosis not present

## 2020-09-04 DIAGNOSIS — F331 Major depressive disorder, recurrent, moderate: Secondary | ICD-10-CM | POA: Diagnosis not present

## 2020-09-11 DIAGNOSIS — F331 Major depressive disorder, recurrent, moderate: Secondary | ICD-10-CM | POA: Diagnosis not present

## 2020-09-18 DIAGNOSIS — F331 Major depressive disorder, recurrent, moderate: Secondary | ICD-10-CM | POA: Diagnosis not present

## 2020-10-15 DIAGNOSIS — R350 Frequency of micturition: Secondary | ICD-10-CM | POA: Diagnosis not present

## 2020-10-15 DIAGNOSIS — R300 Dysuria: Secondary | ICD-10-CM | POA: Diagnosis not present

## 2020-10-15 DIAGNOSIS — Z113 Encounter for screening for infections with a predominantly sexual mode of transmission: Secondary | ICD-10-CM | POA: Diagnosis not present

## 2020-10-15 DIAGNOSIS — Z708 Other sex counseling: Secondary | ICD-10-CM | POA: Diagnosis not present

## 2020-10-16 ENCOUNTER — Ambulatory Visit (HOSPITAL_COMMUNITY): Payer: Self-pay

## 2020-11-11 IMAGING — US OBSTETRIC <14 WK US AND TRANSVAGINAL OB US
1 series · 15 of 28 positions shown · non-contrast
Comparison: None available.

CLINICAL DATA: Initial evaluation for acute cramping, early
pregnancy.

EXAM:
OBSTETRIC <14 WK US AND TRANSVAGINAL OB US
TECHNIQUE: Both transabdominal and transvaginal ultrasound examinations were
performed for complete evaluation of the gestation as well as the
maternal uterus, adnexal regions, and pelvic cul-de-sac.
Transvaginal technique was performed to assess early pregnancy.

[Series 1: obstetric <14 wk us and transvaginal ob us · 36 acquisitions, 15 frames shown]
[im 1/36]
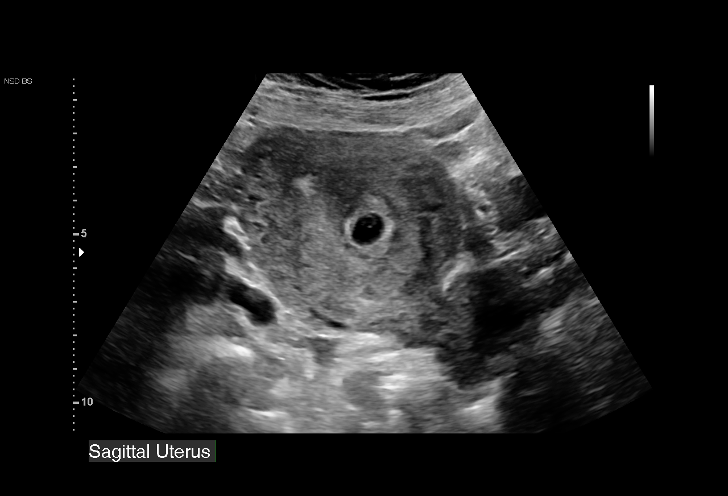
[im 3/36]
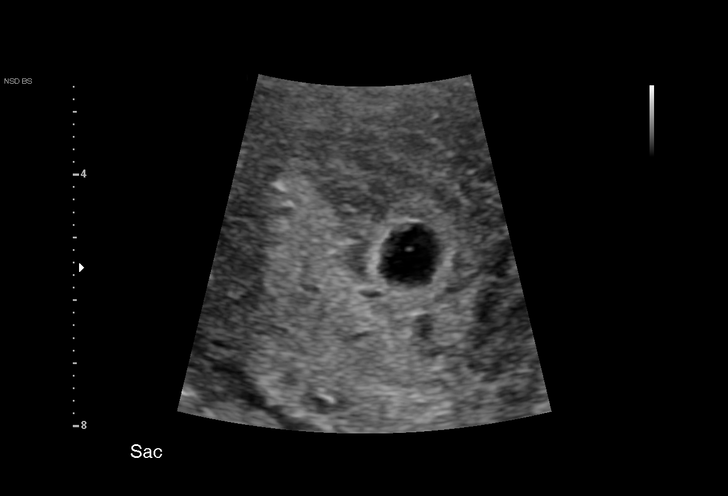
[im 6/36]
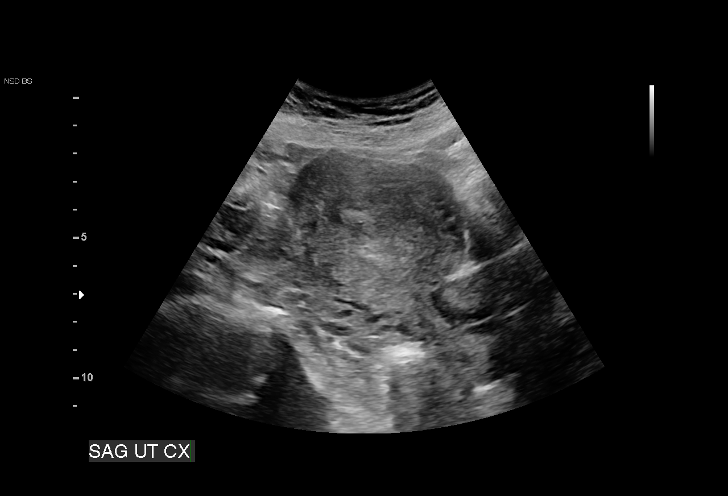
[im 8/36]
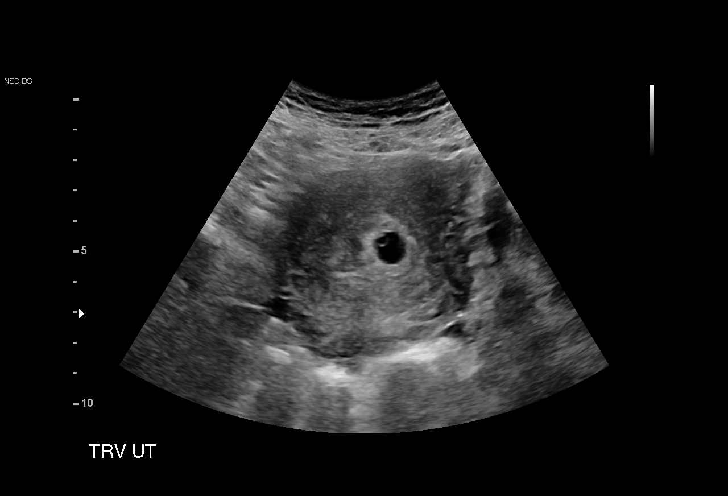
[im 11/36]
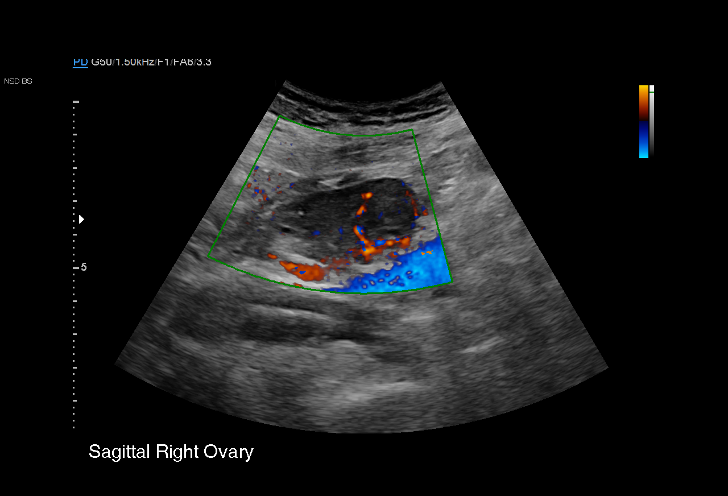
[im 13/36]
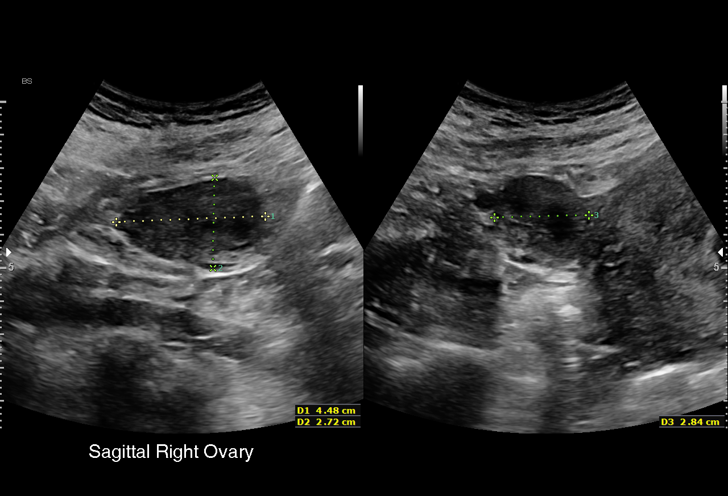
[im 16/36]
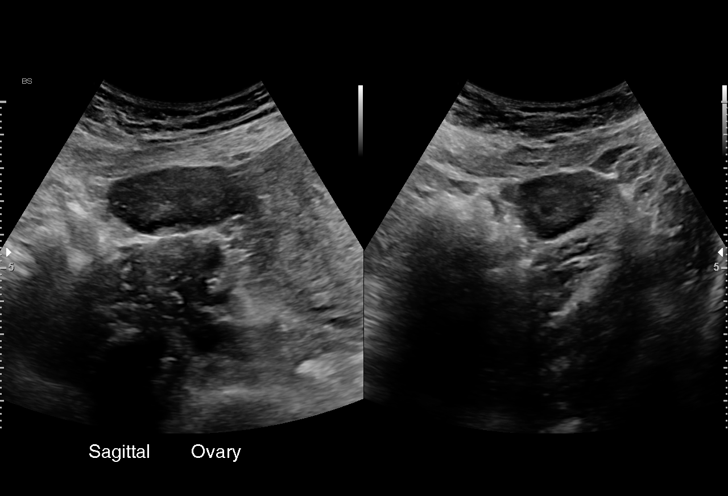
[im 19/36]
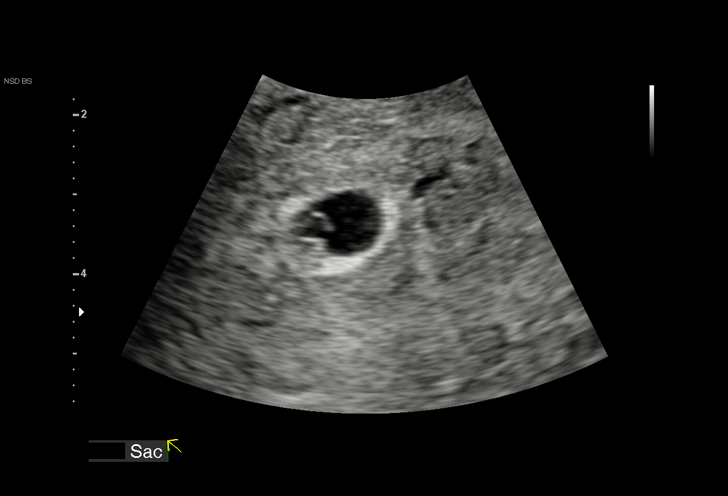
[im 20/36]
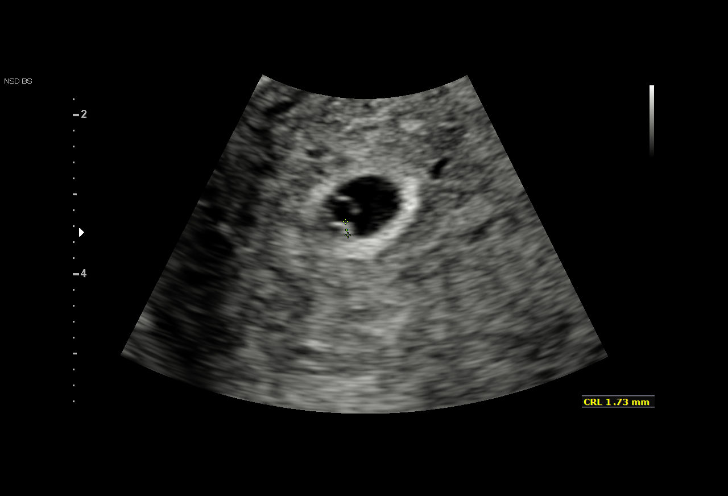
[im 23/36]
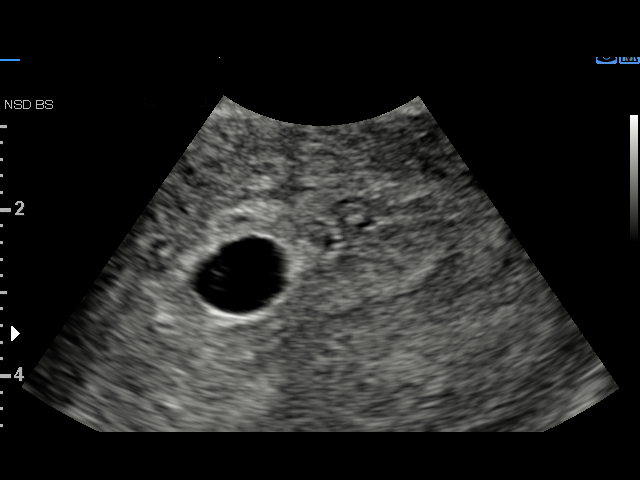
[im 25/36]
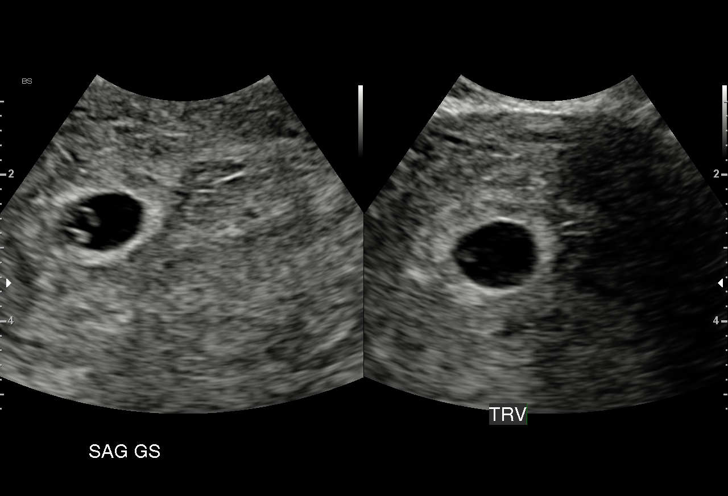
[im 28/36]
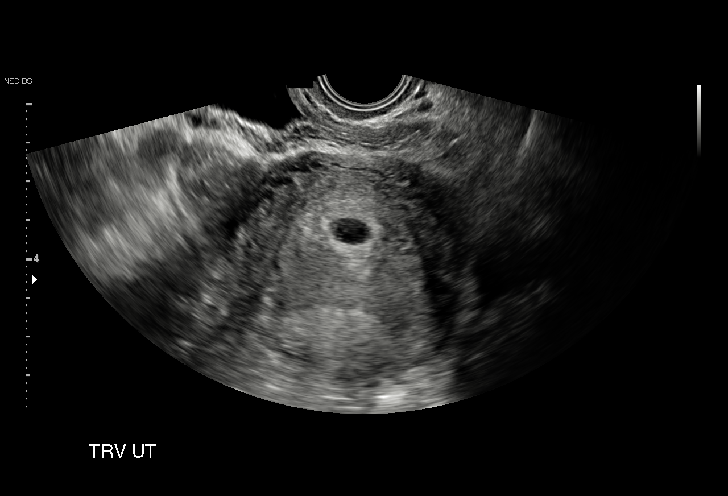
[im 30/36]
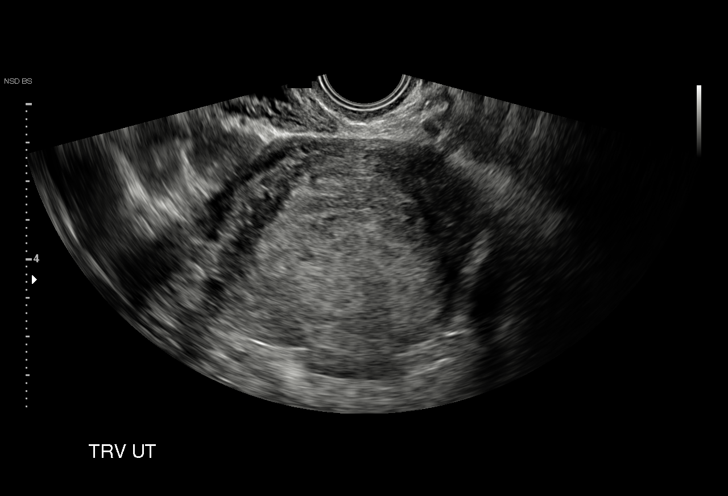
[im 33/36]
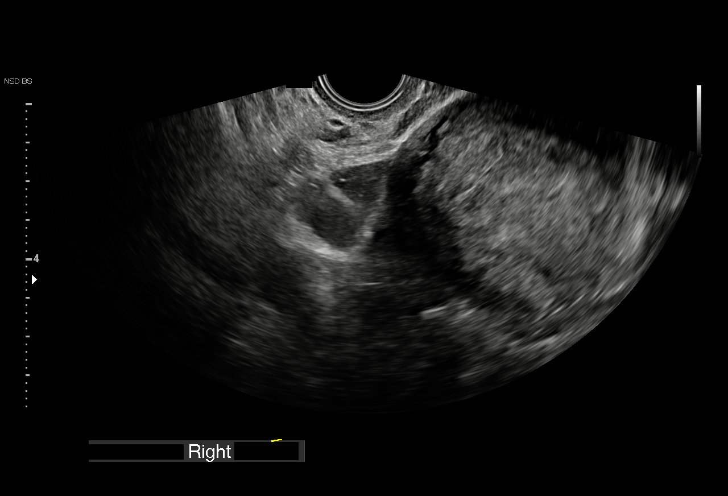
[im 36/36]
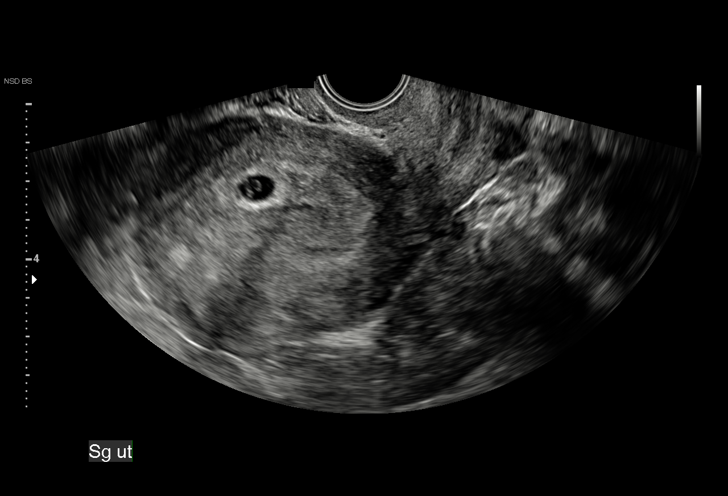

[15 of 28 positions shown; findings below may reference images not displayed]

FINDINGS: Intrauterine gestational sac: Single

Yolk sac:  Present

Embryo: Present, measuring approximately 1.6 mm, too early to
calculate gestational age given small size.

Cardiac Activity: Not visible.

Heart Rate: N/A  bpm

MSD: 10.3 mm   5 w   5 d

Subchorionic hemorrhage:  None visualized.

Maternal uterus/adnexae: Ovaries are normal in appearance
bilaterally. Small corpus luteal cyst noted on the right. No free
fluid within the pelvis.
IMPRESSION: 1. Single intrauterine gestational sac with internal yolk sac, and
probable less than 2 mm fetal pole. Cardiac activity not detectable
as of yet given the small fetal pole size. Recommend follow-up
quantitative B-HCG levels and follow-up US in 14 days to assess
viability.
2. No other acute maternal uterine or adnexal abnormality
identified.

## 2020-11-13 DIAGNOSIS — Z3009 Encounter for other general counseling and advice on contraception: Secondary | ICD-10-CM | POA: Diagnosis not present

## 2020-11-13 DIAGNOSIS — N941 Unspecified dyspareunia: Secondary | ICD-10-CM | POA: Diagnosis not present

## 2020-11-13 DIAGNOSIS — Z0000 Encounter for general adult medical examination without abnormal findings: Secondary | ICD-10-CM | POA: Diagnosis not present

## 2020-11-16 ENCOUNTER — Other Ambulatory Visit: Payer: Self-pay

## 2020-11-16 ENCOUNTER — Encounter (HOSPITAL_COMMUNITY): Payer: Self-pay | Admitting: Emergency Medicine

## 2020-11-16 ENCOUNTER — Ambulatory Visit (HOSPITAL_COMMUNITY)
Admission: EM | Admit: 2020-11-16 | Discharge: 2020-11-16 | Disposition: A | Discharge status: Home / Self Care | Payer: Medicaid Other | Attending: Sports Medicine | Admitting: Sports Medicine

## 2020-11-16 DIAGNOSIS — T7840XA Allergy, unspecified, initial encounter: Secondary | ICD-10-CM | POA: Diagnosis not present

## 2020-11-16 DIAGNOSIS — R21 Rash and other nonspecific skin eruption: Secondary | ICD-10-CM

## 2020-11-16 MED ORDER — METHYLPREDNISOLONE SODIUM SUCC 125 MG IJ SOLR
125.0000 mg | Freq: Once | INTRAMUSCULAR | Status: AC
  Administered 2020-11-16: 125 mg via INTRAMUSCULAR

## 2020-11-16 MED ORDER — METHYLPREDNISOLONE SODIUM SUCC 125 MG IJ SOLR
INTRAMUSCULAR | Status: AC
  Filled 2020-11-16: qty 2

## 2020-11-16 MED ORDER — PREDNISONE 10 MG PO TABS: 20.0000 mg | ORAL_TABLET | Freq: Two times a day (BID) | ORAL | 0 refills | Status: DC

## 2020-11-16 NOTE — ED Provider Notes (Signed)
[facility]    CSN: [id number] Arrival date & time: 11/16/20  1002      History   Chief Complaint Chief Complaint  Patient presents with  . Pruritis  . Rash    HPI [name] is a 35 y.o. female.   Pleasant 35 year old female who presents for evaluation of the above issue.  Normally sees [facility] for ongoing medical care.  She works in a daycare.  She reports 3 days of rash and itchiness over most of her body.  Seems to be worse at night.  Is worse on her neck.  No new exposures or foods.  She has been taking clindamycin for a tooth infection that she stopped when the rash began.  She only had 2 days left.  No anaphylaxis.  No difficulty breathing.  No difficulty swallowing.  No chest pain or shortness of breath.  No red flag signs or symptoms elicited on history.     Past Medical History:  Diagnosis Date  . Acne   . Anemia   . Chlamydia   . HSV (herpes simplex virus) infection   . Infection    UTI    Patient Active Problem List   Diagnosis Date Noted  . S/P repeat low transverse C-section 09/12/2019  . Alpha thalassemia silent carrier 03/21/2019  . Adopted 02/28/2019  . Obesity (BMI 30-39.9) 02/28/2019  . H/O cesarean section 02/20/2019  . Asthma 04/12/2007    Past Surgical History:  Procedure Laterality Date  . CESAREAN SECTION    . CESAREAN SECTION N/A 09/12/2019   Procedure: CESAREAN SECTION;  Surgeon: Federico Flake, MD;  Location: [facility];  Service: Obstetrics;  Laterality: N/A;  . DILATION AND CURETTAGE OF UTERUS      OB History    Gravida  6   Para  4   Term  4   Preterm  0   AB  2   Living  4     SAB  0   IAB  2   Ectopic  0   Multiple  0   Live Births  4            Home Medications    Prior to Admission medications   Medication Sig Start Date End Date Taking? Authorizing Provider  clindamycin (CLEOCIN) 300 MG capsule Take 300 mg by mouth every 6 (six) hours. 11/06/20  Yes  [provider]  Clindamycin-Benzoyl Per, Refr, gel Apply topically. 07/01/20 07/01/21 Yes [provider]  predniSONE (DELTASONE) 10 MG tablet Take 2 tablets (20 mg total) by mouth 2 (two) times daily with a meal. 11/16/20  Yes Delton See, MD  tretinoin (RETIN-A) 0.05 % cream Apply a pea size amount to the face except upper eyelids at night. Skip a few nights if you get too dry 10/11/19  Yes [provider]  enalapril (VASOTEC) 5 MG tablet Take 1 tablet (5 mg total) by mouth daily. 09/18/19 10/18/19  Judeth Horn, NP  etonogestrel-ethinyl estradiol (NUVARING) 0.12-0.015 MG/24HR vaginal ring Insert vaginally and leave in place for 3 consecutive weeks, then remove for 1 week. 10/17/19   Adam Phenix, MD  ferrous sulfate 325 (65 FE) MG tablet Take 325 mg by mouth daily with breakfast.    [provider]  hydrochlorothiazide (HYDRODIURIL) 12.5 MG tablet Take 1 tablet (12.5 mg total) by mouth daily for 3 days. 09/18/19 09/21/19  Judeth Horn, NP  HYDROcodone-acetaminophen (NORCO/VICODIN) 5-325 MG tablet Take 1-2 tablets by mouth  every 4 (four) hours as needed for moderate pain. Patient not taking: Reported on 09/26/2019 09/14/19   Cresenzo-Dishmon, Scarlette Calico, CNM  ibuprofen (ADVIL) 800 MG tablet Take 1 tablet (800 mg total) by mouth every 8 (eight) hours. Patient not taking: Reported on 09/26/2019 09/14/19   Jacklyn Shell, CNM  Prenatal Vit-Fe Fumarate-FA (MULTIVITAMIN-PRENATAL) 27-0.8 MG TABS tablet Take 1 tablet by mouth daily at 12 noon. Patient not taking: Reported on 09/26/2019 02/20/19   Constant, Peggy, MD    Family History Family History  Adopted: Yes  Problem Relation Age of Onset  . Other Neg Hx     Social History Social History   Tobacco Use  . Smoking status: Never Smoker  . Smokeless tobacco: Never Used  Vaping Use  . Vaping Use: Never used  Substance Use Topics  . Alcohol use: No  . Drug use: No     Allergies   Patient has no known  allergies.   Review of Systems Review of Systems  Constitutional: Negative.  Negative for activity change, appetite change, chills, diaphoresis, fatigue and fever.  HENT: Negative.  Negative for congestion and trouble swallowing.   Eyes: Negative.  Negative for pain.  Respiratory: Negative.  Negative for cough, chest tightness, shortness of breath and wheezing.   Cardiovascular: Negative.  Negative for chest pain and palpitations.  Gastrointestinal: Negative.  Negative for abdominal pain.  Genitourinary: Negative.  Negative for dysuria.  Musculoskeletal: Negative.  Negative for arthralgias, back pain, myalgias, neck pain and neck stiffness.  Skin: Positive for rash. Negative for color change, pallor and wound.  Neurological: Negative for dizziness, light-headedness and headaches.  All other systems reviewed and are negative.    Physical Exam Triage Vital Signs ED Triage Vitals  Enc Vitals Group     BP 11/16/20 1024 122/75     Pulse Rate 11/16/20 1024 73     Resp 11/16/20 1024 18     Temp 11/16/20 1024 98.7 F (37.1 C)     Temp Source 11/16/20 1024 Oral     SpO2 11/16/20 1024 100 %     Weight --      Height --      Head Circumference --      Peak Flow --      Pain Score 11/16/20 1019 0     Pain Loc --      Pain Edu? --      Excl. in GC? --    No data found.  Updated Vital Signs BP 122/75 (BP Location: Right Arm)   Pulse 73   Temp 98.7 F (37.1 C) (Oral)   Resp 18   SpO2 100%   Breastfeeding No   Visual Acuity Right Eye Distance:   Left Eye Distance:   Bilateral Distance:    Right Eye Near:   Left Eye Near:    Bilateral Near:     Physical Exam Vitals and nursing note reviewed.  Constitutional:      General: She is not in acute distress.    Appearance: Normal appearance. She is not ill-appearing, toxic-appearing or diaphoretic.  HENT:     Head: Normocephalic and atraumatic.     Nose: No congestion or rhinorrhea.     Mouth/Throat:     Mouth: Mucous  membranes are moist.     Pharynx: No oropharyngeal exudate or posterior oropharyngeal erythema.  Eyes:     General: No scleral icterus.       Right eye: No discharge.  Left eye: No discharge.     Extraocular Movements: Extraocular movements intact.     Conjunctiva/sclera: Conjunctivae normal.     Pupils: Pupils are equal, round, and reactive to light.  Cardiovascular:     Rate and Rhythm: Normal rate and regular rhythm.     Pulses: Normal pulses.     Heart sounds: Normal heart sounds. No murmur heard. No friction rub. No gallop.   Pulmonary:     Effort: Pulmonary effort is normal. No respiratory distress.     Breath sounds: Normal breath sounds. No wheezing, rhonchi or rales.  Musculoskeletal:     Cervical back: Normal range of motion and neck supple. No rigidity or tenderness.  Lymphadenopathy:     Cervical: No cervical adenopathy.  Skin:    General: Skin is warm and dry.     Capillary Refill: Capillary refill takes less than 2 seconds.     Findings: Rash present. No bruising, erythema or lesion.     Comments: Diffuse pinpoint rash throughout body.  Very discrete and at times difficult to see.  No open wounds or active infection.  No phlebitis.  No abscess.  Neurological:     General: No focal deficit present.     Mental Status: She is alert and oriented to person, place, and time.      UC Treatments / Results  Labs (all labs ordered are listed, but only abnormal results are displayed) Labs Reviewed - No data to display  EKG   Radiology No results found.  Procedures Procedures (including critical care time)  Medications Ordered in UC Medications  methylPREDNISolone sodium succinate (SOLU-MEDROL) 125 mg/2 mL injection 125 mg (125 mg Intramuscular Given 11/16/20 1057)    Initial Impression / Assessment and Plan / UC Course  I have reviewed the triage vital signs and the nursing notes.  Pertinent labs & imaging results that were available during my care of the  patient were reviewed by me and considered in my medical decision making (see chart for details).  Clinical impression: Diffuse rash throughout body.  No active infection.  Consistent with allergic reaction of some sort.  Source is not known.  Patient is clinically stable without evidence of anaphylaxis.  Treatment plan: 1.  The findings and treatment plan were discussed in detail with the patient.  Patient was in agreement. 2.  We will give Solu-Medrol IM injection.  125 mg ordered and dispensed in the office today. 3.  Should take a 5-day steroid taper that begins tomorrow.  That was sent to her pharmacy. 4.  Educational handouts provided. 5.  Supportive care, over-the-counter meds as needed. 6.  Advised her to take oral Benadryl at night as they can make her sleepy.  She can also use topical Benadryl or topical hydrocortisone cream but given the diffuseness of the rash this may not be feasible. 7.  Offered a work note she deferred. 8.  If symptoms persist she should see her primary care provider. 9.  If symptoms worsen she should go to the ER.  We discussed red flag signs and symptoms. 10.  Discharge from care and follow-up here as needed.    Final Clinical Impressions(s) / UC Diagnoses   Final diagnoses:  Rash  Allergic reaction, initial encounter     Discharge Instructions     Start oral steroids on Monday.  Call PCP if symptoms persist  Go to the ER if you have any difficulty breathing or feel your throat is swelling    ED  Prescriptions    Medication Sig Dispense Auth. Provider   predniSONE (DELTASONE) 10 MG tablet Take 2 tablets (20 mg total) by mouth 2 (two) times daily with a meal. 20 tablet Delton SeeBarnes, Bedelia Pong, MD     PDMP not reviewed this encounter.   Delton SeeBarnes, Branae Crail, MD 11/16/20 [phone]

## 2020-11-16 NOTE — Discharge Instructions (Signed)
Start oral steroids on Monday.  Call PCP if symptoms persist  Go to the ER if you have any difficulty breathing or feel your throat is swelling

## 2020-11-16 NOTE — ED Triage Notes (Signed)
Pt presents today with c/o of rash/itching to most of body that comes and goes x 3 days. She has been taking Clindamycin (tooth infection) for the past week. Denies new soaps/detergents.

## 2020-11-27 IMAGING — US TRANSVAGINAL OB ULTRASOUND
1 series · 15 of 28 positions shown · non-contrast
Comparison: 01/20/2019

CLINICAL DATA: First trimester pregnancy with inconclusive fetal
viability.

EXAM:
TRANSVAGINAL OB ULTRASOUND
TECHNIQUE: Transvaginal ultrasound was performed for complete evaluation of the
gestation as well as the maternal uterus, adnexal regions, and
pelvic cul-de-sac.

[Series 1: transvaginal ob ultrasound · 15 of 56 slices shown]
[im 1/56]
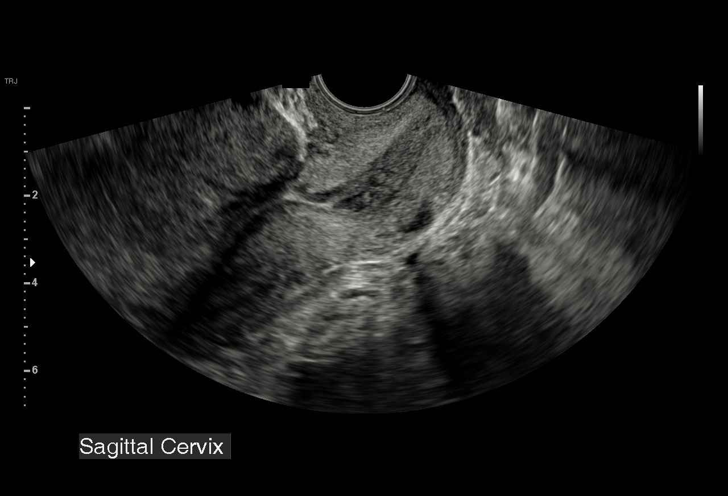
[im 5/56]
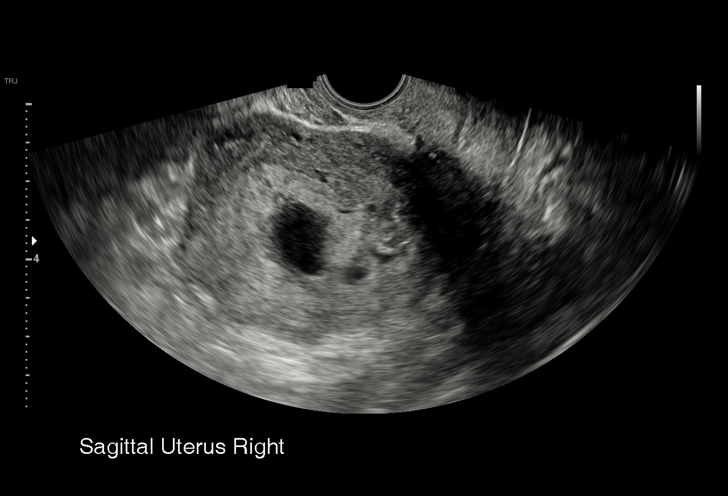
[im 9/56]
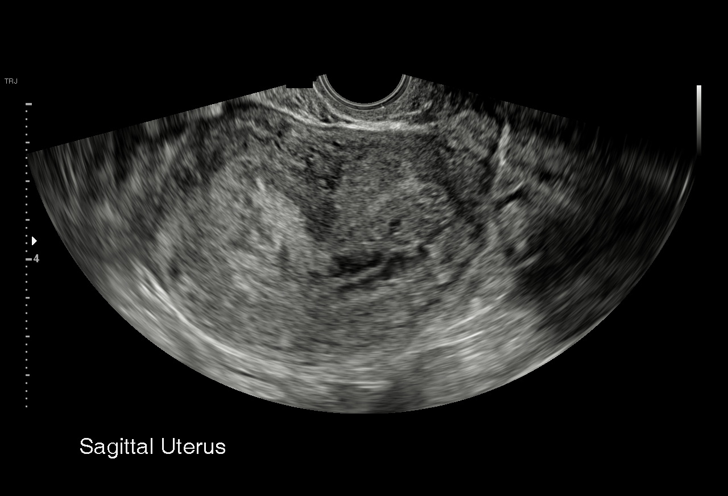
[im 13/56]
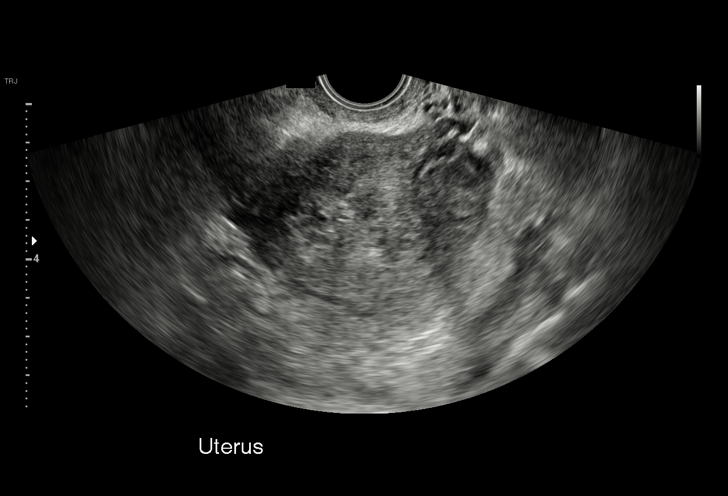
[im 17/56]
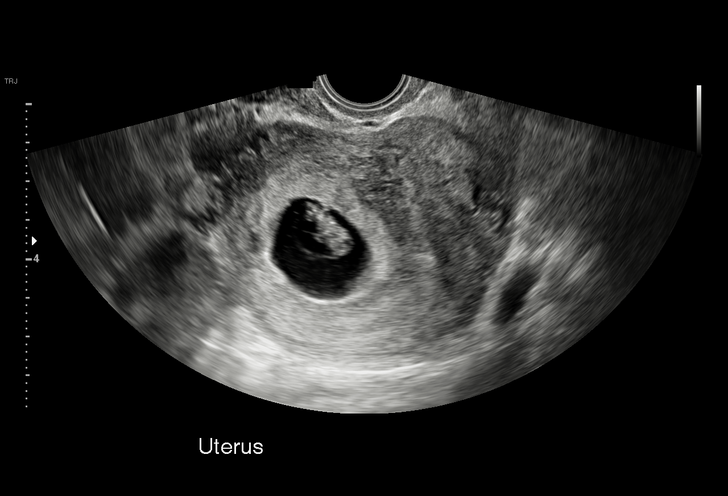
[im 21/56]
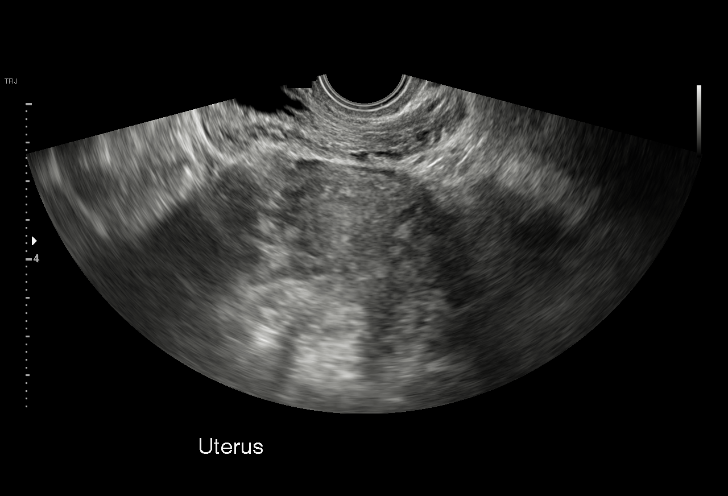
[im 25/56]
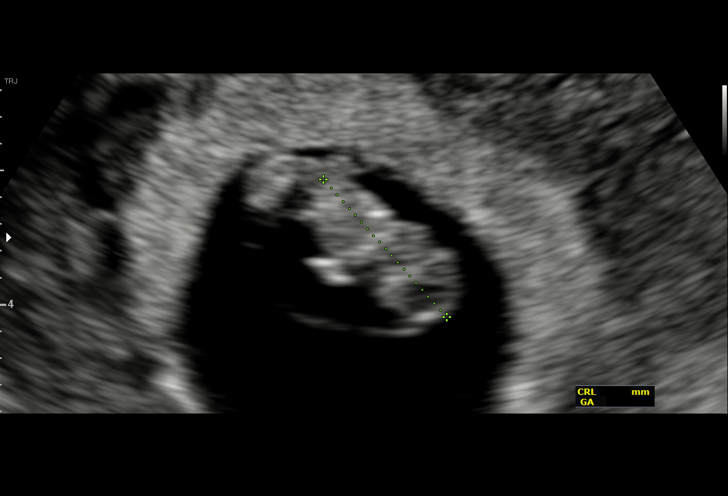
[im 29/56]
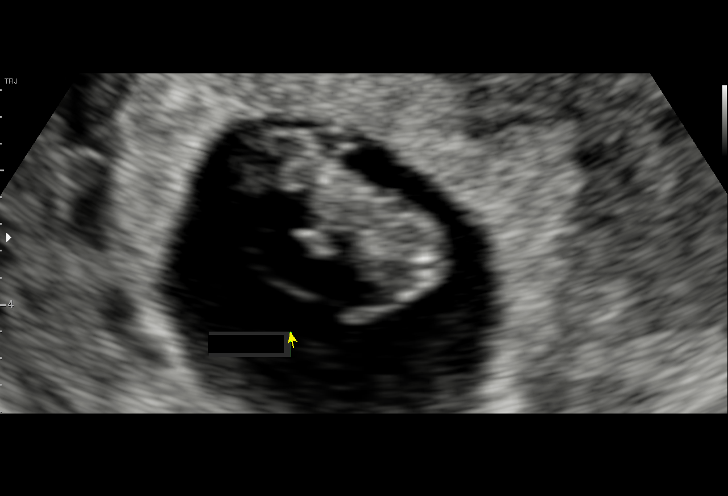
[im 31/56]
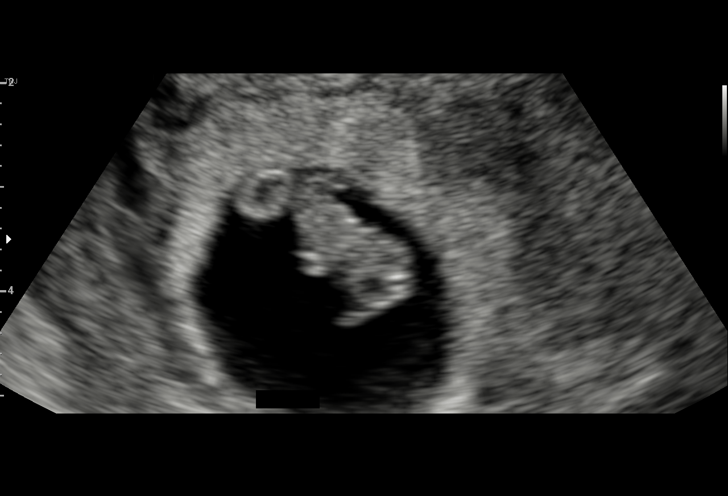
[im 35/56]
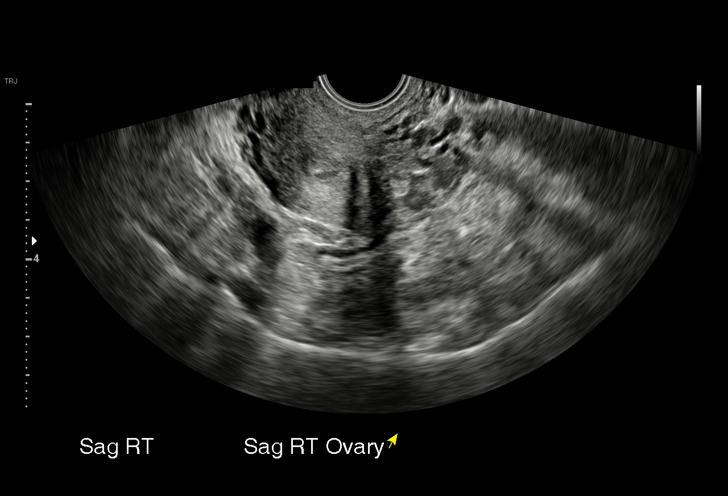
[im 39/56]
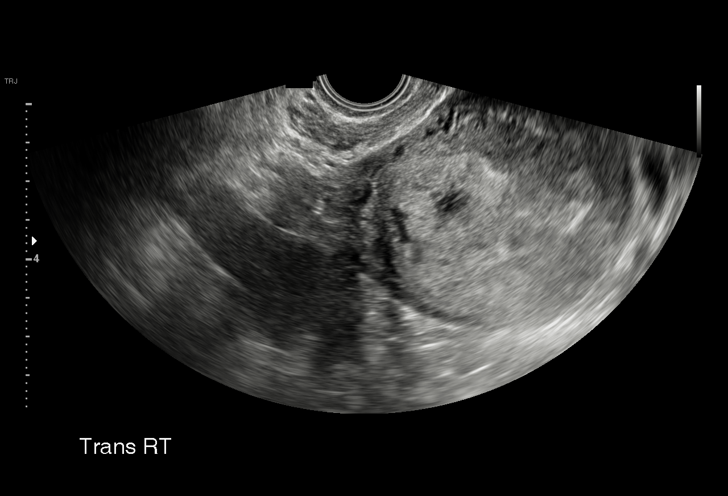
[im 43/56]
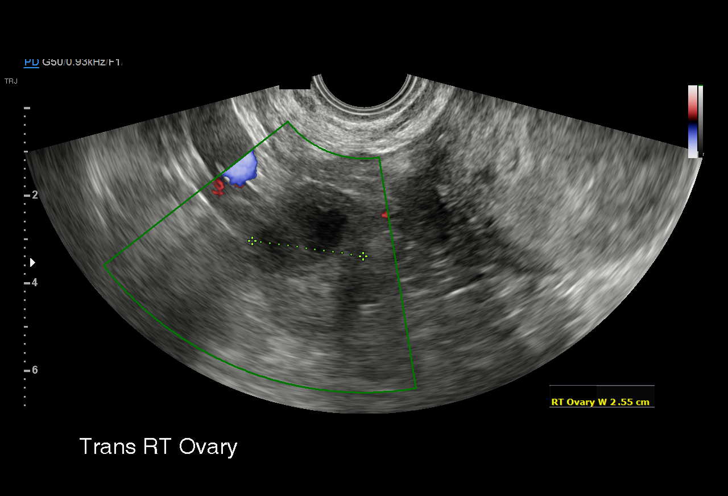
[im 47/56]
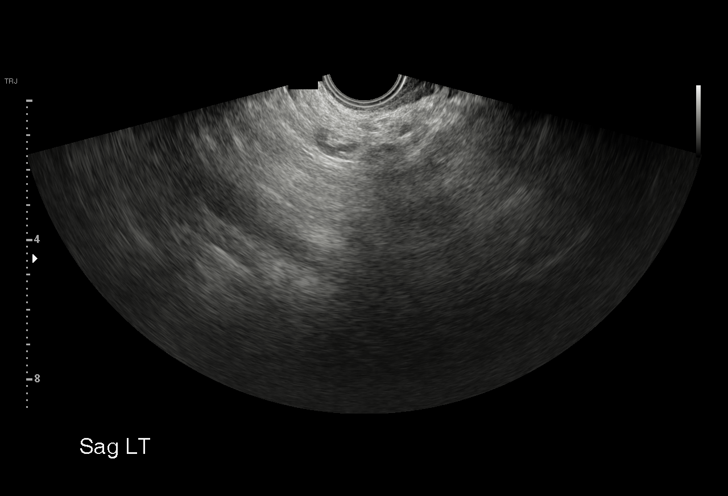
[im 51/56]
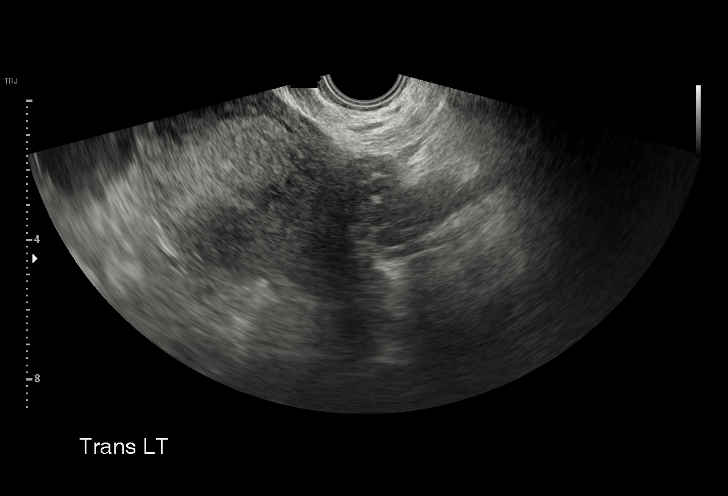
[im 56/56]
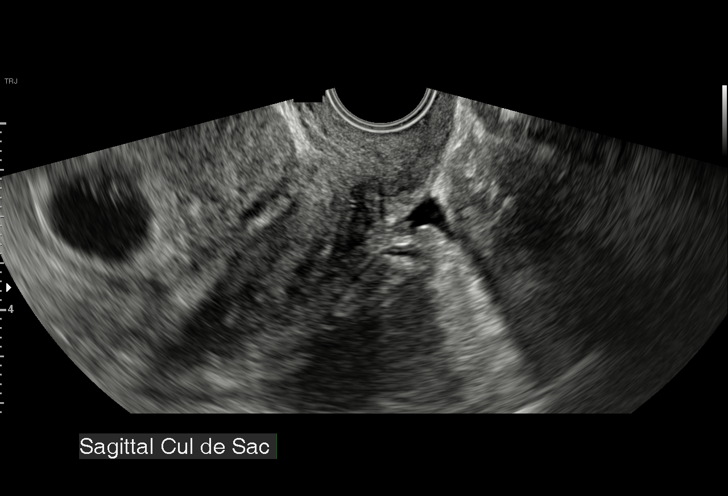

[15 of 28 positions shown; findings below may reference images not displayed]

FINDINGS: Intrauterine gestational sac: Single

Yolk sac:  Visualized.

Embryo:  Visualized.

Cardiac Activity: Visualized.

Heart Rate: 164 bpm

CRL:   14 mm   7 w 5 d                  US EDC: 09/19/2019

Subchorionic hemorrhage:  None visualized.

Maternal uterus/adnexae: Normal appearance of both ovaries. No mass
or abnormal free fluid identified.
IMPRESSION: Single living IUP measuring 7 weeks 5 days, with US EDC of
09/19/2019.

No significant maternal uterine or adnexal abnormality identified.

## 2021-01-04 IMAGING — US US OB COMP LESS 14 WK
1 series · 15 of 28 positions shown · non-contrast
Comparison: 01/06/2019

CLINICAL DATA: Vaginal bleeding in 1st trimester pregnancy.
Assigned GA currently 13 weeks 1 day by prior ultrasound.

EXAM:
OBSTETRIC <14 WK ULTRASOUND
TECHNIQUE: Transabdominal ultrasound was performed for evaluation of the
gestation as well as the maternal uterus and adnexal regions.

[Series 1: us ob comp less 14 wk · 47 acquisitions, 15 frames shown]
[im 1/47]
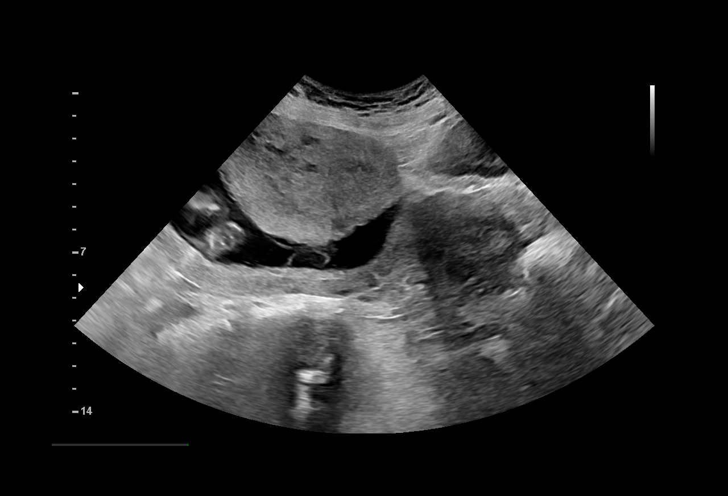
[im 4/47]
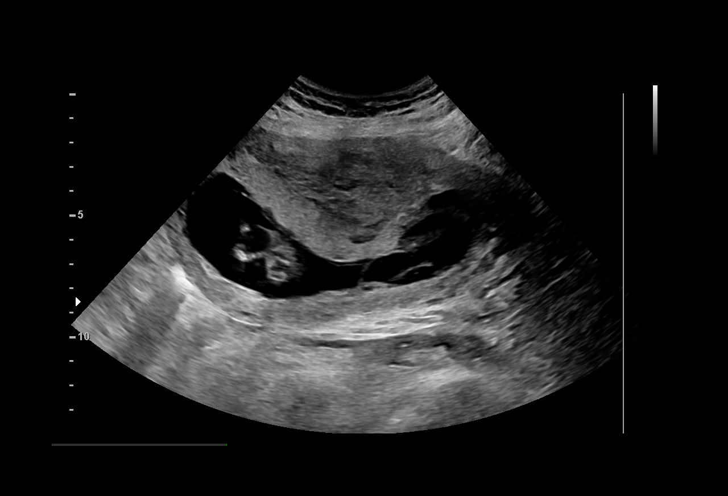
[im 7/47]
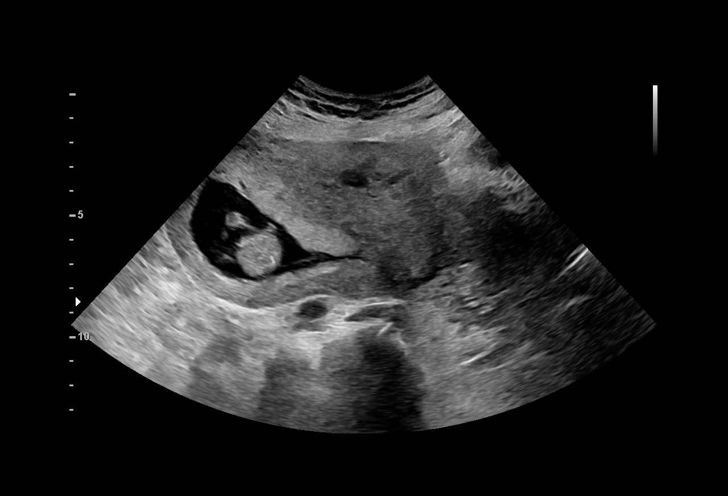
[im 11/47]
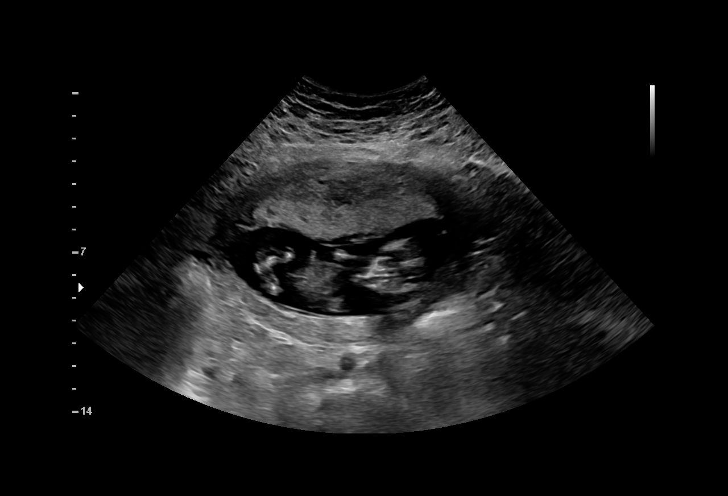
[im 14/47]
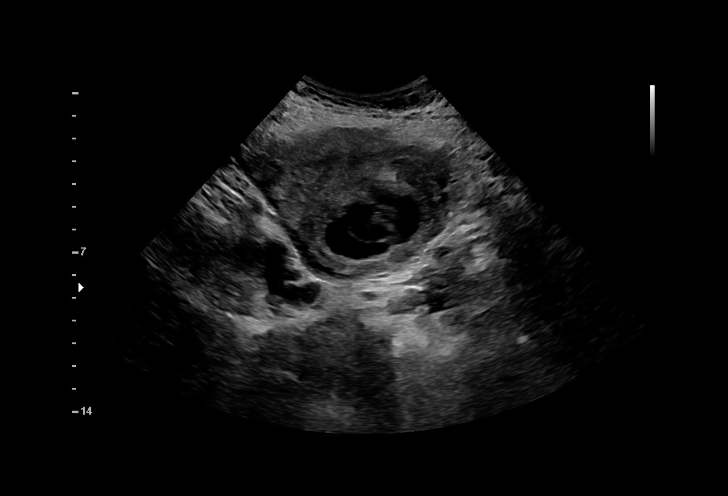
[im 18/47]
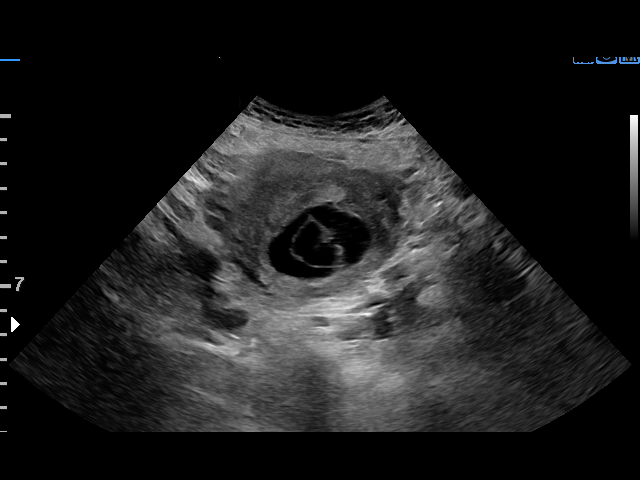
[im 21/47]
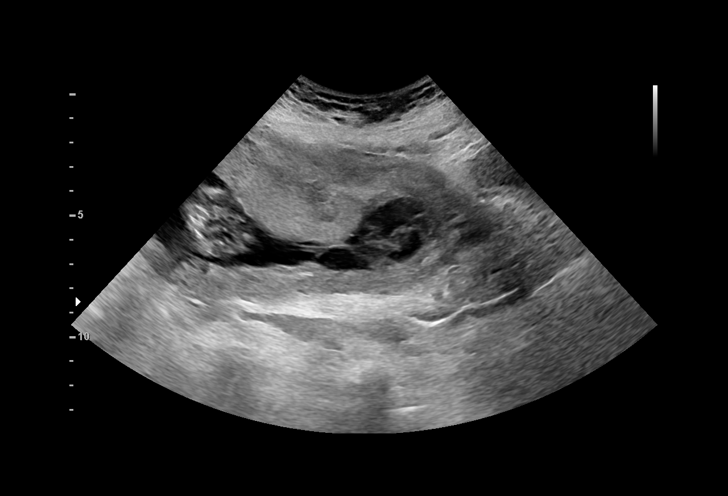
[im 24/47]
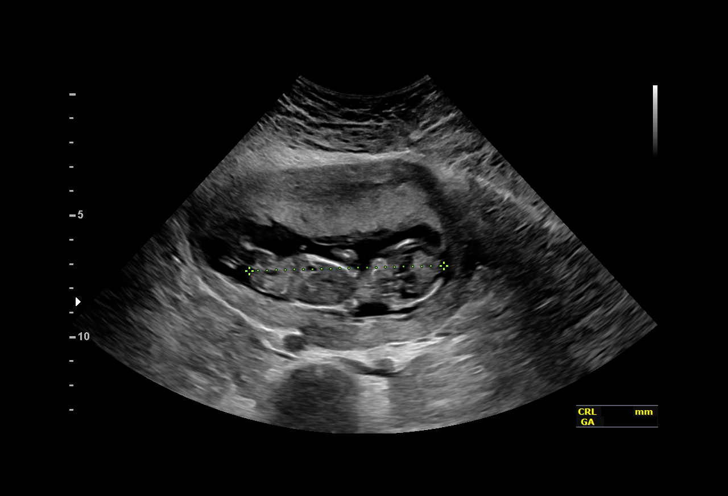
[im 26/47]
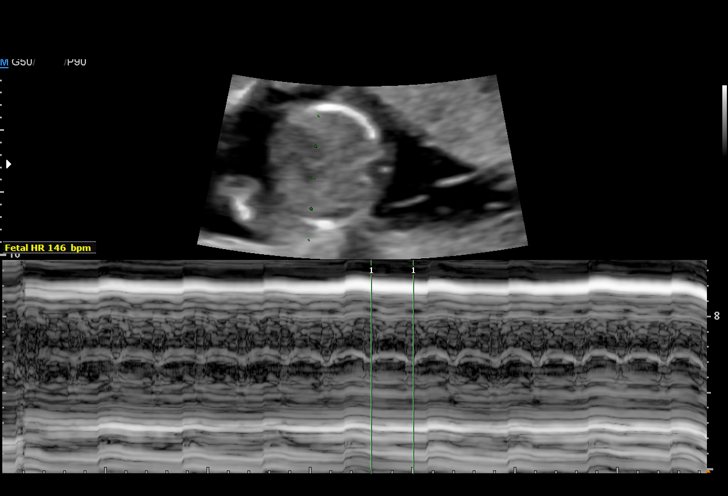
[im 29/47]
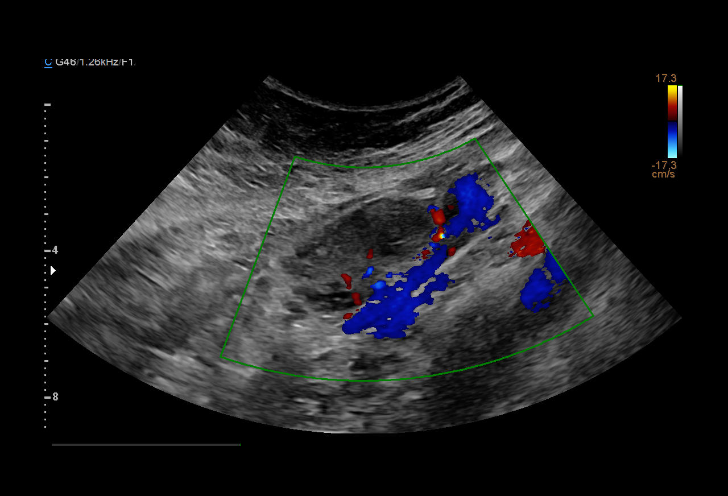
[im 33/47]
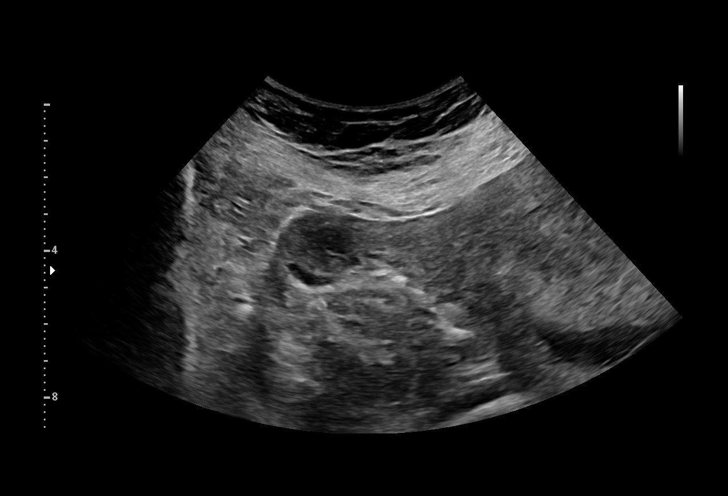
[im 36/47]
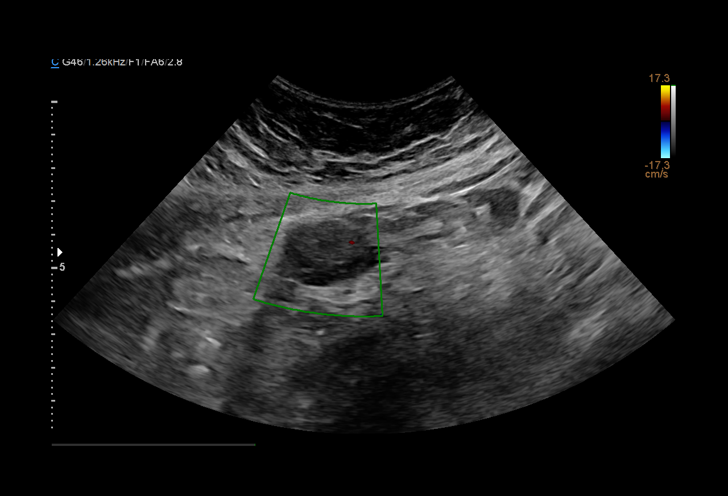
[im 40/47]
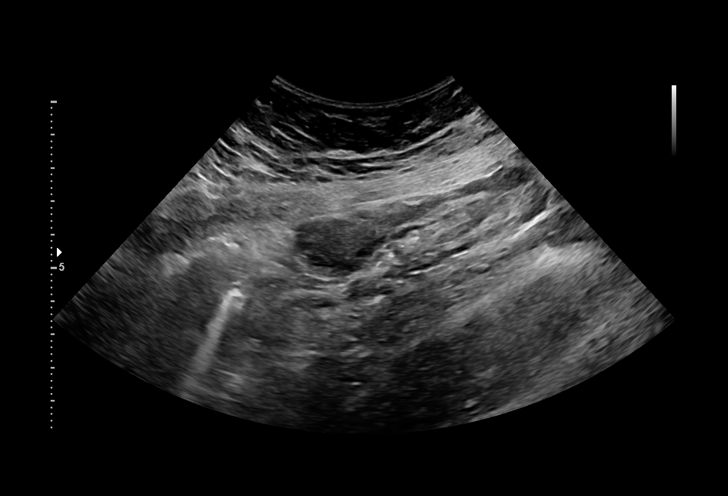
[im 43/47]
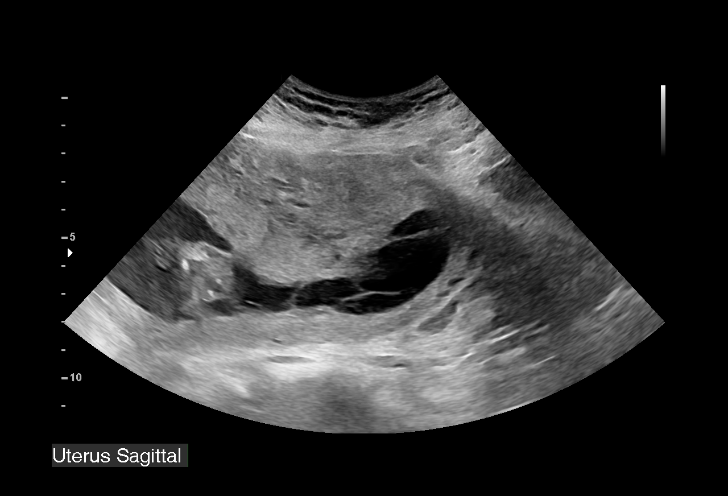
[im 47/47]
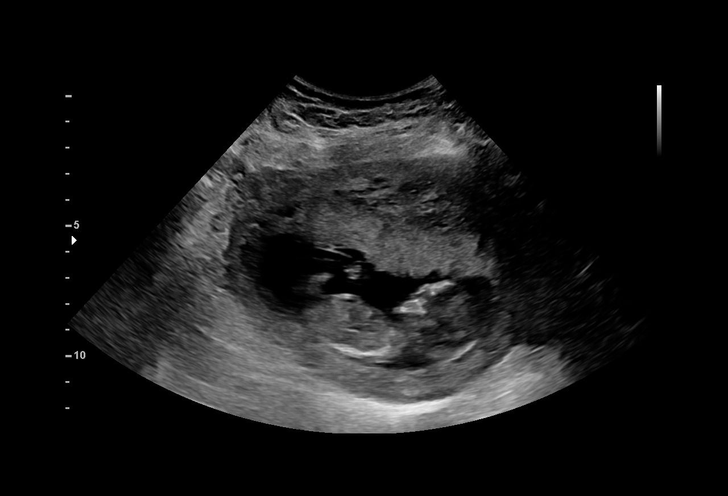

[15 of 28 positions shown; findings below may reference images not displayed]

FINDINGS: Intrauterine gestational sac: Single

Yolk sac:  Not Visualized.

Embryo:  Visualized.

Cardiac Activity: Visualized.

Heart Rate: 146 bpm

CRL:   79 mm   13 w 6 d                  US EDC: 09/14/2019

Subchorionic hemorrhage: Small subchorionic hemorrhage seen in the
lower uterine segment along the inferior margin of the gestational
sac.

Maternal uterus/adnexae: No fibroids identified. Normal appearance
of both ovaries. No adnexal mass or abnormal free fluid identified.
IMPRESSION: Assigned GA currently 13 weeks 1 day.  Appropriate interval growth.

Small subchorionic hemorrhage.

## 2021-01-11 ENCOUNTER — Other Ambulatory Visit: Payer: Self-pay | Admitting: Obstetrics and Gynecology

## 2021-02-14 IMAGING — US US MFM OB DETAIL+14 WK
1 series · 13 of 28 positions shown · non-contrast
Comparison: none

[Series 1: us mfm ob detail+14 wk · 13 of 98 slices shown]
[im 4/98]
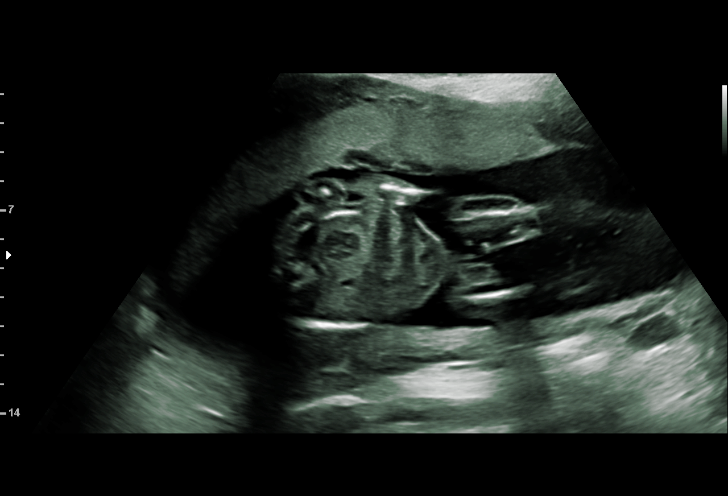
[im 11/98]
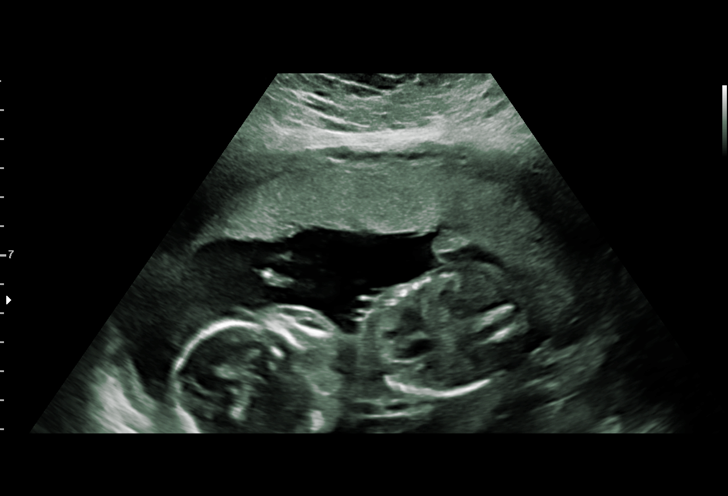
[im 18/98]
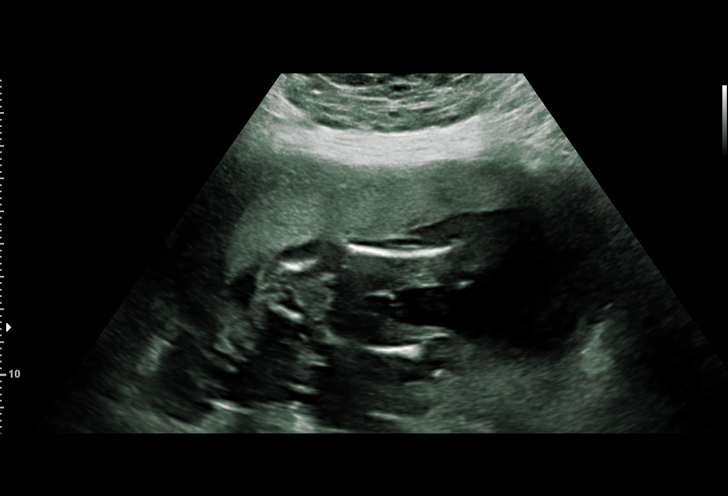
[im 26/98]
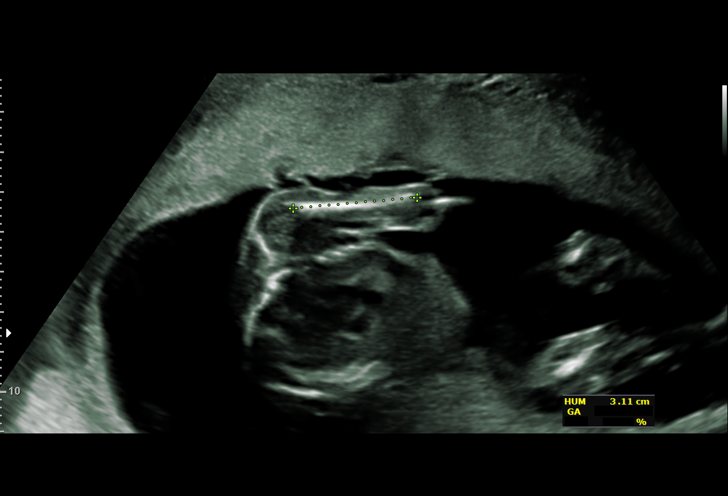
[im 33/98]
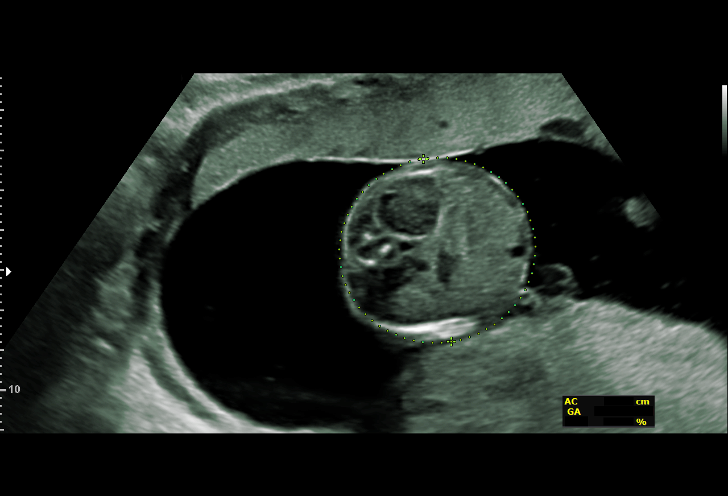
[im 40/98]
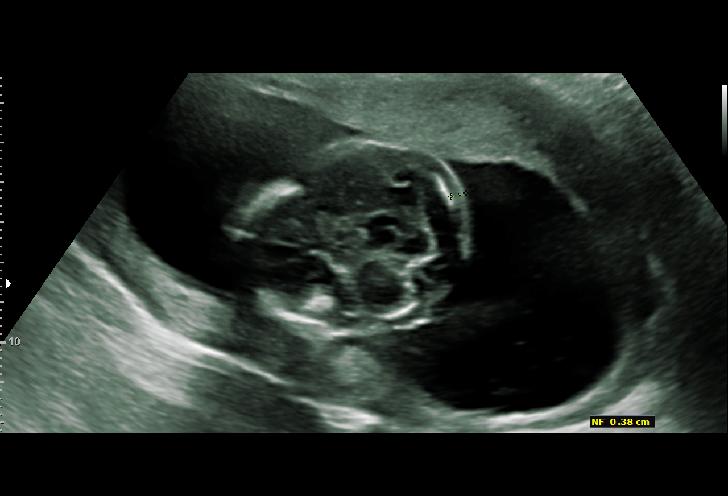
[im 51/98]
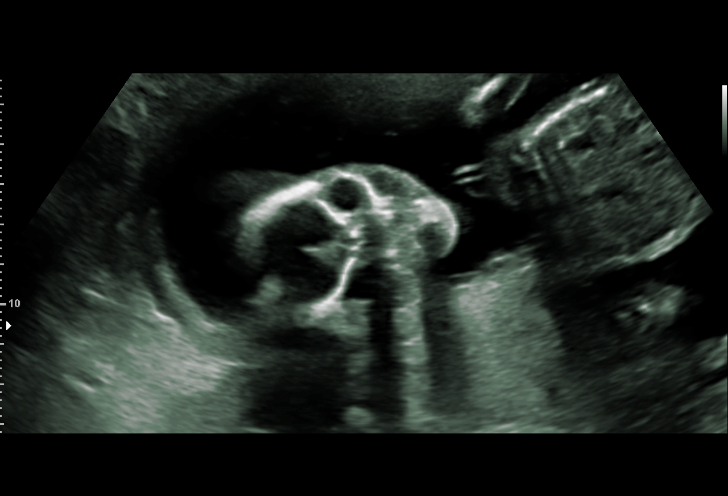
[im 58/98]
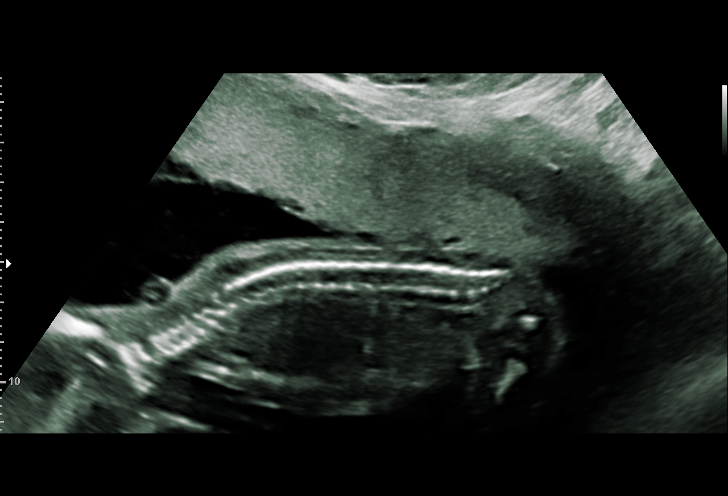
[im 65/98]
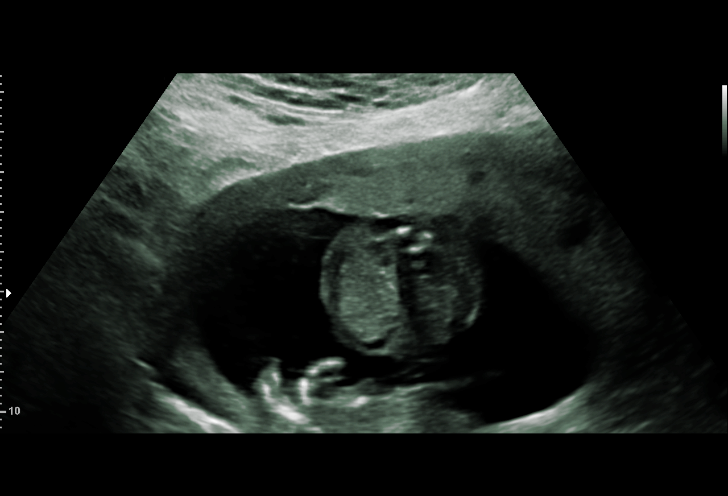
[im 72/98]
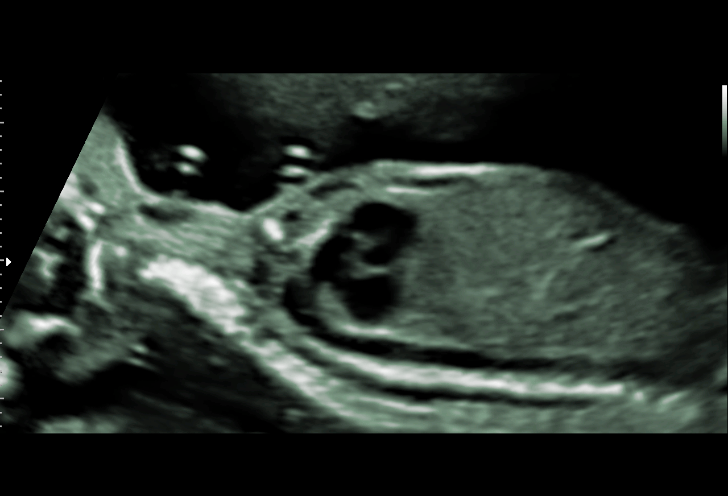
[im 80/98]
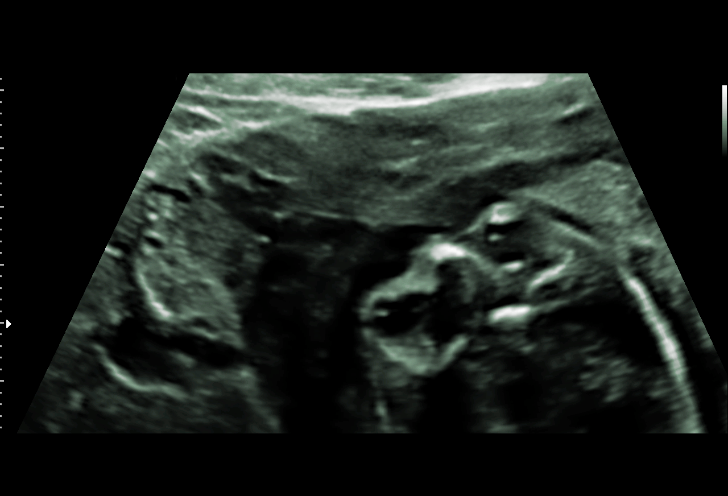
[im 87/98]
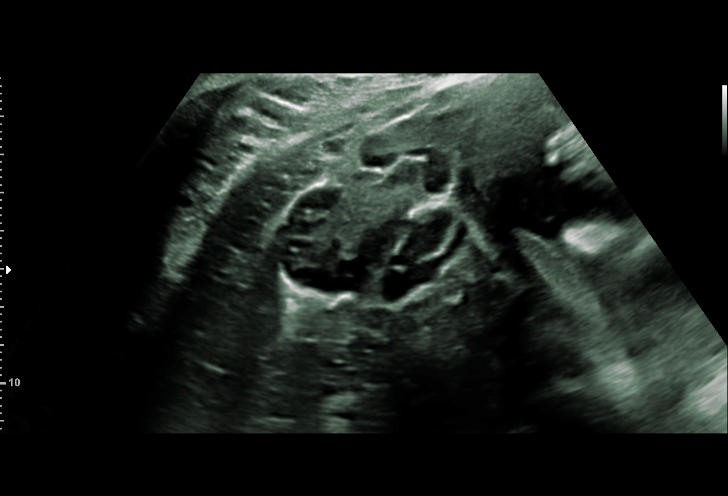
[im 94/98]
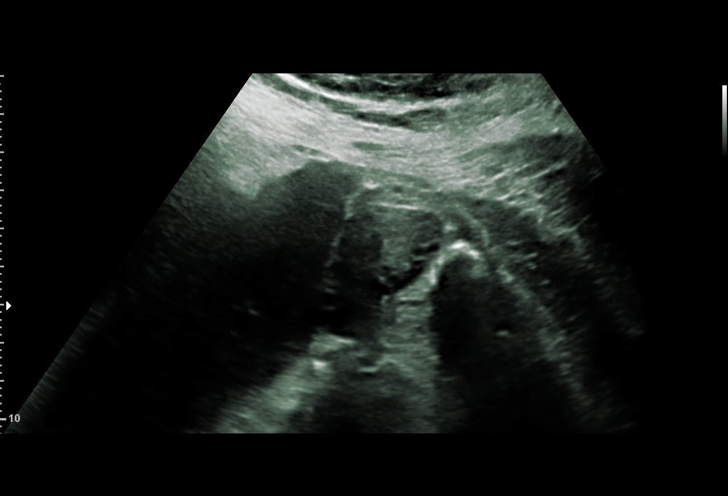

[13 of 28 positions shown; findings below may reference images not displayed]

----------------------------------------------------------------------

 ----------------------------------------------------------------------
Indications

  Encounter for antenatal screening for
  malformations
  History of cesarean delivery, currently
  pregnant x 3
  20 weeks gestation of pregnancy
  Obesity complicating pregnancy, second
  trimester
 ----------------------------------------------------------------------
Fetal Evaluation

 Num Of Fetuses:          1
 Fetal Heart Rate(bpm):   143
 Cardiac Activity:        Observed
 Presentation:            Variable
 Placenta:                Anterior
 P. Cord Insertion:       Visualized, central

 Amniotic Fluid
 AFI FV:      Within normal limits

                             Largest Pocket(cm)

Biometry

 BPD:      46.8  mm     G. Age:  20w 1d         50  %    CI:        71.95   %    70 - 86
                                                         FL/HC:       18.1  %    16.8 -
 HC:      175.6  mm     G. Age:  20w 0d         38  %    HC/AC:       1.17       1.09 -
 AC:      150.4  mm     G. Age:  20w 2d         49  %    FL/BPD:      67.7  %
 FL:       31.7  mm     G. Age:  19w 6d         32  %    FL/AC:       21.1  %    20 - 24
 HUM:      31.3  mm     G. Age:  20w 3d         60  %
 CER:      20.5  mm     G. Age:  19w 4d         36  %
 NFT:       3.8  mm
 LV:        6.2  mm
 CM:        5.1  mm

 Est. FW:     331   gm   0 lb 12 oz      42  %
OB History

 Gravidity:    6         Term:   3
 TOP:          2        Living:  3
Gestational Age

 U/S Today:     20w 1d                                        EDD:   09/11/19
 Best:          20w 1d     Det. By:  U/S (04/25/19)           EDD:   09/11/19
Anatomy

 Cranium:               Appears normal         LVOT:                   Appears normal
 Cavum:                 Appears normal         Aortic Arch:            Not well visualized
 Ventricles:            Appears normal         Ductal Arch:            Appears normal
 Choroid Plexus:        Appears normal         Diaphragm:              Appears normal
 Cerebellum:            Appears normal         Stomach:                Appears normal, left
                                                                       sided
 Posterior Fossa:       Appears normal         Abdomen:                Appears normal
 Nuchal Fold:           Appears normal         Abdominal Wall:         Appears nml (cord
                                                                       insert, abd wall)
 Face:                  Appears normal         Cord Vessels:           Appears normal (3
                        (orbits and profile)                           vessel cord)
 Lips:                  Appears normal         Kidneys:                Appear normal
 Palate:                Appears normal         Bladder:                Appears normal
 Thoracic:              Appears normal         Spine:                  Appears normal
 Heart:                 Appears normal         Upper Extremities:      Appears normal
                        (4CH, axis, and
                        situs)
 RVOT:                  Appears normal         Lower Extremities:      Appears normal

 Other:  Male gender. Heels visualized. Nasal bone visualized. Technically
         difficult due to maternal habitus and fetal position.
Cervix Uterus Adnexa

 Cervix
 Length:            3.8  cm.
 Normal appearance by transabdominal scan.

 Left Ovary
 Within normal limits.

 Right Ovary
 Within normal limits.
Impression

 Normal interval growth.  No ultrasonic evidence of structural
 fetal anomalies.
 Low risk NIPS
 Suboptimal views of the fetal anatomy were obtained
 secondary to fetal position.
Recommendations

 Follow up anatomy in  4 weeks.

## 2021-07-30 DIAGNOSIS — F330 Major depressive disorder, recurrent, mild: Secondary | ICD-10-CM | POA: Diagnosis not present

## 2021-08-20 DIAGNOSIS — F330 Major depressive disorder, recurrent, mild: Secondary | ICD-10-CM | POA: Diagnosis not present

## 2021-08-27 DIAGNOSIS — F330 Major depressive disorder, recurrent, mild: Secondary | ICD-10-CM | POA: Diagnosis not present

## 2021-09-03 DIAGNOSIS — F330 Major depressive disorder, recurrent, mild: Secondary | ICD-10-CM | POA: Diagnosis not present

## 2021-09-10 DIAGNOSIS — F330 Major depressive disorder, recurrent, mild: Secondary | ICD-10-CM | POA: Diagnosis not present

## 2021-09-14 DIAGNOSIS — Z803 Family history of malignant neoplasm of breast: Secondary | ICD-10-CM | POA: Diagnosis not present

## 2021-09-14 DIAGNOSIS — Z1239 Encounter for other screening for malignant neoplasm of breast: Secondary | ICD-10-CM | POA: Diagnosis not present

## 2021-09-17 DIAGNOSIS — F330 Major depressive disorder, recurrent, mild: Secondary | ICD-10-CM | POA: Diagnosis not present

## 2021-09-24 DIAGNOSIS — F330 Major depressive disorder, recurrent, mild: Secondary | ICD-10-CM | POA: Diagnosis not present

## 2021-10-08 DIAGNOSIS — F330 Major depressive disorder, recurrent, mild: Secondary | ICD-10-CM | POA: Diagnosis not present

## 2021-10-22 DIAGNOSIS — F330 Major depressive disorder, recurrent, mild: Secondary | ICD-10-CM | POA: Diagnosis not present

## 2021-10-29 DIAGNOSIS — F330 Major depressive disorder, recurrent, mild: Secondary | ICD-10-CM | POA: Diagnosis not present

## 2021-11-19 DIAGNOSIS — F330 Major depressive disorder, recurrent, mild: Secondary | ICD-10-CM | POA: Diagnosis not present

## 2021-11-26 DIAGNOSIS — F330 Major depressive disorder, recurrent, mild: Secondary | ICD-10-CM | POA: Diagnosis not present

## 2021-12-22 DIAGNOSIS — Z0000 Encounter for general adult medical examination without abnormal findings: Secondary | ICD-10-CM | POA: Diagnosis not present

## 2021-12-22 DIAGNOSIS — Z713 Dietary counseling and surveillance: Secondary | ICD-10-CM | POA: Diagnosis not present

## 2021-12-22 DIAGNOSIS — Z13220 Encounter for screening for lipoid disorders: Secondary | ICD-10-CM | POA: Diagnosis not present

## 2021-12-22 DIAGNOSIS — Z131 Encounter for screening for diabetes mellitus: Secondary | ICD-10-CM | POA: Diagnosis not present

## 2021-12-22 DIAGNOSIS — Z3044 Encounter for surveillance of vaginal ring hormonal contraceptive device: Secondary | ICD-10-CM | POA: Diagnosis not present

## 2021-12-22 DIAGNOSIS — Z6841 Body Mass Index (BMI) 40.0 and over, adult: Secondary | ICD-10-CM | POA: Diagnosis not present

## 2021-12-22 DIAGNOSIS — Z7182 Exercise counseling: Secondary | ICD-10-CM | POA: Diagnosis not present

## 2021-12-22 DIAGNOSIS — O99019 Anemia complicating pregnancy, unspecified trimester: Secondary | ICD-10-CM | POA: Diagnosis not present

## 2022-03-11 DIAGNOSIS — N898 Other specified noninflammatory disorders of vagina: Secondary | ICD-10-CM | POA: Diagnosis not present

## 2022-03-11 DIAGNOSIS — B372 Candidiasis of skin and nail: Secondary | ICD-10-CM | POA: Diagnosis not present

## 2022-05-25 ENCOUNTER — Ambulatory Visit (INDEPENDENT_AMBULATORY_CARE_PROVIDER_SITE_OTHER): Payer: Medicaid Other | Admitting: Internal Medicine

## 2022-05-25 ENCOUNTER — Encounter (INDEPENDENT_AMBULATORY_CARE_PROVIDER_SITE_OTHER): Payer: Self-pay | Admitting: Internal Medicine

## 2022-05-25 VITALS — BP 127/85 | HR 77 | Temp 98.3°F | Ht 64.0 in | Wt 240.0 lb

## 2022-05-25 DIAGNOSIS — R638 Other symptoms and signs concerning food and fluid intake: Secondary | ICD-10-CM | POA: Diagnosis not present

## 2022-05-25 DIAGNOSIS — Z6841 Body Mass Index (BMI) 40.0 and over, adult: Secondary | ICD-10-CM

## 2022-05-26 NOTE — Progress Notes (Signed)
Office: [phone]  /  Fax: [phone]   Initial Visit  [name] was seen in clinic today to evaluate for obesity. She is interested in losing weight to improve overall health and reduce the risk of weight related complications. She presents today to review program treatment options, initial physical assessment, and evaluation.   She has 4 children Works at a daycare.  She notes gaining weight throughout her pregnancies and difficulty losing the weight.  She acknowledges eating for comfort and also when stressed.  She also skips meals.  She notes decreased energy and getting short of breath with exertion.  No reports of chest pain, palpitations or lower extremity edema.  She was referred by: Friend or Family  When asked what else they would like to accomplish? She states: Adopt healthier eating patterns, Improve quality of life, and Improve appearance  When asked how has your weight affected you? She states: Contributed to orthopedic problems or mobility issues, Having fatigue, Having poor endurance, and Problems with eating patterns  Some associated conditions: None  Contributing factors: Family history, Disruption of circadian rhythm, Nutritional, Stress, Reduced physical activity, Eating patterns, and Pregnancy  Weight promoting medications identified: None  Current nutrition plan: None  Current level of physical activity: None  Current or previous pharmacotherapy: None  Response to medication: Never tried medications   Past medical history includes:   Past Medical History:  Diagnosis Date   Acne    Anemia    Chlamydia    HSV (herpes simplex virus) infection    Infection    UTI     Objective:   Pulse 77   Temp 98.3 F (36.8 C)   Ht 5\' 4"  (1.626 m)   Wt 240 lb (108.9 kg)   SpO2 100%   BMI 41.20 kg/m  She was weighed on the bioimpedance scale: Body mass index is 41.2 kg/m.  Peak Weight: 250,Visceral Fat Rating: 13, Body Fat%: 48.3, Weight trend over  the last 12 months: Unchanged  General:  Alert, oriented and cooperative. Patient is in no acute distress.  Respiratory: Normal respiratory effort, no problems with respiration noted  Extremities: Normal range of motion.    Mental Status: Normal mood and affect. Normal behavior. Normal judgment and thought content.   Assessment and Plan:  1. Class 3 severe obesity without serious comorbidity with body mass index (BMI) of 40.0 to 44.9 in adult, unspecified obesity type ([place]) We reviewed weight, biometrics, associated medical conditions and contributing factors with patient. She would benefit from weight loss therapy via a modified calorie, low-carb, high-protein nutritional plan tailored to their REE (resting energy expenditure) which will be determined by indirect calorimetry.  We will also assess for cardiometabolic risk and nutritional derangements via fasting serologies at her next appointment.  Rheanne considers herself a "foodie" and enjoys food.  One of her biggest concerns is restricting certain foods.  We will work with her in creating a reduced calorie state but also able to enjoy some of the food she likes in moderation.  This will take some gradual adjustments at the beginning.  2. Alteration in eating processes Patient acknowledges having unhealthy eating patterns associated with stress and for comfort mostly around carbs.  She will be screened for eating disorder at her intake appointment.         Obesity Treatment / Action Plan:  Patient will work on garnering support from family and friends to begin weight loss journey. Will work on eliminating or reducing the presence of highly  palatable, calorie dense foods in the home. Will complete provided nutritional and psychosocial assessment questionnaire before the next appointment. Will be scheduled for indirect calorimetry to determine resting energy expenditure in a fasting state.  This will allow [place] to create a reduced calorie,  high-protein meal plan to promote loss of fat mass while preserving muscle mass. Will think about ideas on how to incorporate physical activity into their daily routine. Will work on reducing intake of added sugars, simple sugars and processed carbs. Will avoid skipping meals which may result in increased hunger signals and overeating at certain times. Was counseled on nutritional approaches to weight loss and benefits of complex carbs and high quality protein as part of nutritional weight management.  Obesity Education Performed Today:  She was weighed on the bioimpedance scale and results were discussed and documented in the synopsis.  We discussed obesity as a disease and the importance of a more detailed evaluation of all the factors contributing to the disease.  We discussed the importance of long term lifestyle changes which include nutrition, exercise and behavioral modifications as well as the importance of customizing this to her specific health and social needs.  We discussed the benefits of reaching a healthier weight to alleviate the symptoms of existing conditions and reduce the risks of the biomechanical, metabolic and psychological effects of obesity.  Chaniya Genter appears to be in the action stage of change and states they are ready to start intensive lifestyle modifications and behavioral modifications.  30 minutes was spent today on this visit including the above counseling, pre-visit chart review, and post-visit documentation.  Reviewed by clinician on day of visit: allergies, medications, problem list, medical history, surgical history, family history, social history, and previous encounter notes.     I have reviewed the above documentation for accuracy and completeness, and I agree with the above.  Worthy Rancher, MD

## 2022-06-01 ENCOUNTER — Other Ambulatory Visit: Payer: Self-pay

## 2022-06-01 DIAGNOSIS — B009 Herpesviral infection, unspecified: Secondary | ICD-10-CM

## 2022-06-01 MED ORDER — VALACYCLOVIR HCL 500 MG PO TABS: ORAL_TABLET | ORAL | 2 refills | Status: DC

## 2022-07-24 ENCOUNTER — Ambulatory Visit: Payer: Medicaid Other

## 2022-07-27 ENCOUNTER — Telehealth: Payer: Medicaid Other | Admitting: Obstetrics and Gynecology

## 2022-07-27 DIAGNOSIS — Z3009 Encounter for other general counseling and advice on contraception: Secondary | ICD-10-CM | POA: Diagnosis not present

## 2022-07-27 NOTE — Progress Notes (Signed)
TELEHEALTH GYNECOLOGY VISIT ENCOUNTER NOTE  Provider location: [facility]   Patient location: Work  I connected with Abelardo Diesel on 07/27/22 at  1:50 PM EST by telephone and verified that I am speaking with the correct person using two identifiers. Patient was unable to do MyChart audiovisual encounter due to technical difficulties, she tried several times.    I discussed the limitations, risks, security and privacy concerns of performing an evaluation and management service by telephone and the availability of in person appointments. I also discussed with the patient that there may be a patient responsible charge related to this service. The patient expressed understanding and agreed to proceed.   History:  Margaree Sandhu is a 37 y.o. [phone] with Nuvaring presenting for discussion of nonhormonal contraception.  Has four children. She is sure she does not want any more children & her partner doesn't want more children. Wants to be off hormones, does not want to have to remember to keep track of a method. Desires permanent sterilization. Has discussed vasectomy with her partner, but he would prefer not to get vasectomy and she is certain she doesn't want more children even if she had a new/different partner.  Previous method - Nuvaring for years. Recent bleeding x 2 weeks, kept coming out  Struggles with acne. Worried about estrogen withdrawal symptoms that she's read about- hair loss, weight gain, worsening of acne, mouth sores.   Menstrual Hx: Nuvaring PMH: anxiety PSH: CSx4 Meds: Nuvaring All: NKDA OB: [id number] - CS x4 Pap Hx: 09/24/2013 NILM 02/28/2019 NILM/HPV negative Soc: denies t/e/d   Past Medical History:  Diagnosis Date   Acne    Anemia    Chlamydia    HSV (herpes simplex virus) infection    Infection    UTI   Past Surgical History:  Procedure Laterality Date   CESAREAN SECTION     CESAREAN SECTION N/A 09/12/2019   Procedure:  CESAREAN SECTION;  Surgeon: Caren Macadam, MD;  Location: [facility] LD ORS;  Service: Obstetrics;  Laterality: N/A;   DILATION AND CURETTAGE OF UTERUS     The following portions of the patient's history were reviewed and updated as appropriate: allergies, current medications, past family history, past medical history, past social history, past surgical history and problem list.   Review of Systems:  Pertinent items noted in HPI and remainder of comprehensive ROS otherwise negative.  Physical Exam:   General:  Alert, oriented and cooperative.   Mental Status: Normal mood and affect perceived. Normal judgment and thought content.  Physical exam deferred due to nature of the encounter  Labs and Imaging No results found for this or any previous visit (from the past 336 hour(s)). No results found.    Assessment and Plan:    37yo [id number] with undesired fertility 1. Birth control counseling 2. Unwanted fertility - She desires permanent sterilization. Discussed alternatives including LARC options and vasectomy.  - Discussed surgery of salpingectomy vs tubal ligation and recommended bilateral salpingectomy if possible at time of surgery. - Risks of surgery include but are not limited to: bleeding, infection, injury to surrounding organs/tissues (i.e. bowel/bladder/ureters), need for additional procedures, wound complications, hospital re-admission, and conversion to open surgery, VTE. Specifically reviewed how 4 prior CS may affect our ability to perform surgery.  - Reviewed restrictions and recovery following surgery   After discussion, Ms. Kuk is unsure of how she wants to proceed. She will notify our office if she wants to move forward with  salpingectomy.  I discussed the assessment and treatment plan with the patient. The patient was provided an opportunity to ask questions and all were answered. The patient agreed with the plan and demonstrated an understanding of the instructions.  I  provided 20 minutes of non-face-to-face time during this encounter.  Inez Catalina, MD [facility], [place]

## 2022-07-27 NOTE — Progress Notes (Signed)
MyChart GYN wants to talk about BTL.

## 2022-09-02 ENCOUNTER — Encounter: Payer: Self-pay | Admitting: Obstetrics and Gynecology

## 2022-12-20 ENCOUNTER — Ambulatory Visit: Payer: Medicaid Other | Admitting: Dermatology

## 2022-12-20 ENCOUNTER — Encounter: Payer: Self-pay | Admitting: Dermatology

## 2022-12-20 VITALS — BP 113/75 | HR 75

## 2022-12-20 DIAGNOSIS — L810 Postinflammatory hyperpigmentation: Secondary | ICD-10-CM

## 2022-12-20 DIAGNOSIS — L700 Acne vulgaris: Secondary | ICD-10-CM | POA: Diagnosis not present

## 2022-12-20 MED ORDER — DOXYCYCLINE MONOHYDRATE 100 MG PO CAPS: ORAL_CAPSULE | ORAL | 2 refills | Status: DC

## 2022-12-20 MED ORDER — TRETINOIN 0.025 % EX CREA: TOPICAL_CREAM | CUTANEOUS | 2 refills | Status: DC

## 2022-12-20 MED ORDER — CLINDAMYCIN PHOSPHATE 1 % EX SWAB: CUTANEOUS | 2 refills | Status: DC

## 2022-12-20 NOTE — Progress Notes (Signed)
New Patient Visit   Subjective  [name] is a 37 y.o. female who presents for the following: acne and dark spots on face. Acne is better controlled since on Nuvaring. Still has breakouts from time to time. Has used Clindamycin/BPO gel and Tretinoin 0.05% cream a few years ago. She is more concerned about the dark spots.  Moisturizes face with CeraVe cream, uses CeraVe facial scrub.   The following portions of the chart were reviewed this encounter and updated as appropriate: medications, allergies, medical history  Review of Systems:  No other skin or systemic complaints except as noted in HPI or Assessment and Plan.  Objective  Well appearing patient in no apparent distress; mood and affect are within normal limits.  A focused examination was performed of the following areas: Face, neck  Relevant exam findings are noted in the Assessment and Plan.         Assessment & Plan    ACNE VULGARIS with perioral dermatitis  Exam: Open/closed comedones at cheeks and jaw line.  Chronic and persistent condition with duration or expected duration over one year. Condition is symptomatic/ bothersome to patient. Not currently at goal.   Treatment Plan: Start Doyxycycline 100 mg every night with dinner.   Start Clindamycin pledgets every morning.   Apply tretinoin 0.025% cream pea sized amount 3 nights per week to face.  Topical retinoid medications like tretinoin/Retin-A, adapalene/Differin, tazarotene/Fabior, and Epiduo/Epiduo Forte can cause dryness and irritation when first started. Only apply a pea-sized amount to the entire affected area. Avoid applying it around the eyes, edges of mouth and creases at the nose. If you experience irritation, use a good moisturizer first and/or apply the medicine less often. If you are doing well with the medicine, you can increase how often you use it until you are applying every night. Be careful with sun protection while using this  medication as it can make you sensitive to the sun. This medicine should not be used by pregnant women.    Doxycycline should be taken with food to prevent nausea. Do not lay down for 30 minutes after taking. Be cautious with sun exposure and use good sun protection while on this medication. Pregnant women should not take this medication.   Use CeraVe lotion instead of cream.    Daily Regimen Template:  Morning: 1. Wash your face with a gentle cleanser. CeraVe Hydrating Cleanser. 2. Apply Clindamycin Pledget to face 3. Follow with a moisturizer and sunscreen suitable for acne-prone skin.   Evening: 1. Wash your face with a gentle cleanser. CeraVe Hydrating Cleanser. 2. Apply a pea-sized amount of  Tretinoin 0.025%  to entire face.  Start only using 3 night per week and gradually increase as tolerated. 3. Apply a non-comedogenic moisturizer to keep the skin hydrated overnight (Neutrogena, CeraVe, Cetaphil)  Note: Always follow your dermatologist's recommendations and treatment plan for best results. Consistency is key to managing acne effectively. If you experience any severe side effects or worsening of symptoms, consult your healthcare provider promptly.     POST-INFLAMMATORY HYPERPIGMENTATION (PIH) Exam: hyperpigmented macules and/or patches at face   This is a benign condition that comes from having previous inflammation in the skin and will fade with time over months to sometimes years. Recommend daily sun protection including sunscreen SPF 30+ to sun-exposed areas. - Recommend treating any itchy or red areas on the skin quickly to prevent new areas of PIH. Treating with prescription medicines such as hydroquinone may help fade dark spots  faster.    Treatment Plan:  -topical retinoid started today will help lift some excess pigment -once skin is acclimated to retinoid we will add in a separate topical lightening agent -recommended broad spectrum SPF30 every day, hats and sun  avoidence.    Return in about 3 months (around 03/22/2023) for Acne Follow Up.  I, Lawson Radar, CMA, am acting as scribe for [employer], DO.   Documentation: I have reviewed the above documentation for accuracy and completeness, and I agree with the above.  Langston Reusing, DO

## 2022-12-20 NOTE — Patient Instructions (Addendum)
Daily Regimen Template:  Morning: 1. Wash your face with a gentle cleanser. CeraVe Hydrating Cleanser. 2. Apply Clindamycin Pledget to face 3. Follow with a moisturizer and sunscreen suitable for acne-prone skin.   Evening: 1. Wash your face with a gentle cleanser. CeraVe Hydrating Cleanser. 2. Apply a pea-sized amount of  Tretinoin 0.025%  to entire face.  Start only using 3 night per week and gradually increase as tolerated. 3. Apply a non-comedogenic moisturizer to keep the skin hydrated overnight (Neutrogena, CeraVe, Cetaphil)  Note: Always follow your dermatologist's recommendations and treatment plan for best results. Consistency is key to managing acne effectively. If you experience any severe side effects or worsening of symptoms, consult your healthcare provider promptly.        Recommended Sunscreen. Isntree    Start Doyxycycline 100 mg every night with dinner.   Start Clindamycin pledgets every morning.   Apply tretinoin 0.025% cream pea sized amount 3 nights per week to face.  Topical retinoid medications like tretinoin/Retin-A, adapalene/Differin, tazarotene/Fabior, and Epiduo/Epiduo Forte can cause dryness and irritation when first started. Only apply a pea-sized amount to the entire affected area. Avoid applying it around the eyes, edges of mouth and creases at the nose. If you experience irritation, use a good moisturizer first and/or apply the medicine less often. If you are doing well with the medicine, you can increase how often you use it until you are applying every night. Be careful with sun protection while using this medication as it can make you sensitive to the sun. This medicine should not be used by pregnant women.    Doxycycline should be taken with food to prevent nausea. Do not lay down for 30 minutes after taking. Be cautious with sun exposure and use good sun protection while on this medication. Pregnant women should not take this medication.    Due  to recent changes in healthcare laws, you may see results of your pathology and/or laboratory studies on MyChart before the doctors have had a chance to review them. We understand that in some cases there may be results that are confusing or concerning to you. Please understand that not all results are received at the same time and often the doctors may need to interpret multiple results in order to provide you with the best plan of care or course of treatment. Therefore, we ask that you please give [place] 2 business days to thoroughly review all your results before contacting the office for clarification. Should we see a critical lab result, you will be contacted sooner.   If You Need Anything After Your Visit  If you have any questions or concerns for your doctor, please call our main line at [phone] If no one answers, please leave a voicemail as directed and we will return your call as soon as possible. Messages left after 4 pm will be answered the following business day.   You may also send [place] a message via MyChart. We typically respond to MyChart messages within 1-2 business days.  For prescription refills, please ask your pharmacy to contact our office. Our fax number is [phone].  If you have an urgent issue when the clinic is closed that cannot wait until the next business day, you can page your doctor at the number below.    Please note that while we do our best to be available for urgent issues outside of office hours, we are not available 24/7.   If you have an urgent issue and are unable  to reach [place], you may choose to seek medical care at your doctor's office, retail clinic, urgent care center, or emergency room.  If you have a medical emergency, please immediately call 911 or go to the emergency department. In the event of inclement weather, please call our main line at [phone] for an update on the status of any delays or closures.  Dermatology Medication Tips: Please keep  the boxes that topical medications come in in order to help keep track of the instructions about where and how to use these. Pharmacies typically print the medication instructions only on the boxes and not directly on the medication tubes.   If your medication is too expensive, please contact our office at [phone] or send [place] a message through MyChart.   We are unable to tell what your co-pay for medications will be in advance as this is different depending on your insurance coverage. However, we may be able to find a substitute medication at lower cost or fill out paperwork to get insurance to cover a needed medication.   If a prior authorization is required to get your medication covered by your insurance company, please allow [place] 1-2 business days to complete this process.  Drug prices often vary depending on where the prescription is filled and some pharmacies may offer cheaper prices.  The website www.goodrx.com contains coupons for medications through different pharmacies. The prices here do not account for what the cost may be with help from insurance (it may be cheaper with your insurance), but the website can give you the price if you did not use any insurance.  - You can print the associated coupon and take it with your prescription to the pharmacy.  - You may also stop by our office during regular business hours and pick up a GoodRx coupon card.  - If you need your prescription sent electronically to a different pharmacy, notify our office through [facility] or by phone at [phone]

## 2022-12-29 ENCOUNTER — Encounter: Payer: Self-pay | Admitting: Dermatology

## 2023-01-09 ENCOUNTER — Encounter: Payer: Self-pay | Admitting: Dermatology

## 2023-03-22 ENCOUNTER — Ambulatory Visit: Payer: Medicaid Other | Admitting: Dermatology

## 2023-06-24 ENCOUNTER — Ambulatory Visit
Admission: EM | Admit: 2023-06-24 | Discharge: 2023-06-24 | Disposition: A | Discharge status: Home / Self Care | Payer: Medicaid Other | Attending: Emergency Medicine | Admitting: Emergency Medicine

## 2023-06-24 ENCOUNTER — Ambulatory Visit (INDEPENDENT_AMBULATORY_CARE_PROVIDER_SITE_OTHER): Payer: Medicaid Other

## 2023-06-24 DIAGNOSIS — J209 Acute bronchitis, unspecified: Secondary | ICD-10-CM

## 2023-06-24 MED ORDER — ALBUTEROL SULFATE (2.5 MG/3ML) 0.083% IN NEBU: 2.5000 mg | INHALATION_SOLUTION | Freq: Four times a day (QID) | RESPIRATORY_TRACT | 0 refills | Status: DC | PRN

## 2023-06-24 MED ORDER — PROMETHAZINE-DM 6.25-15 MG/5ML PO SYRP: 5.0000 mL | ORAL_SOLUTION | Freq: Every evening | ORAL | 0 refills | Status: DC | PRN

## 2023-06-24 MED ORDER — ALBUTEROL SULFATE (2.5 MG/3ML) 0.083% IN NEBU
2.5000 mg | INHALATION_SOLUTION | Freq: Once | RESPIRATORY_TRACT | Status: AC
  Administered 2023-06-24: 2.5 mg via RESPIRATORY_TRACT

## 2023-06-24 MED ORDER — ALBUTEROL SULFATE HFA 108 (90 BASE) MCG/ACT IN AERS: 2.0000 | INHALATION_SPRAY | Freq: Four times a day (QID) | RESPIRATORY_TRACT | 2 refills | Status: DC | PRN

## 2023-06-24 MED ORDER — GUAIFENESIN 400 MG PO TABS: ORAL_TABLET | ORAL | 0 refills | Status: DC

## 2023-06-24 NOTE — ED Provider Notes (Signed)
[facility]    CSN: [id number] Arrival date & time: 06/24/23  1712    HISTORY   Chief Complaint  Patient presents with   Cough   HPI [name] is a pleasant, 37 y.o. female who presents to urgent care today. Patient states she have been having sharp burning pain in her chest when coughing, nasal congestion, rhinorrhea and sneezing for the past 5 days, after review of the EMR, I see that saw provider at [facility] 2 days ago who prescribed her a Z-Pak for treatment of presumed bacterial bronchitis, at time of visit, patient complained of a 3-day history of cough. Patient states she has been taking Z pack and symptoms have not improved.  Patient states she has also been feeling short of breath when she coughs.  The history is provided by the patient.   Past Medical History:  Diagnosis Date   Acne    Anemia    Chlamydia    HSV (herpes simplex virus) infection    Infection    UTI   Patient Active Problem List   Diagnosis Date Noted   S/P repeat low transverse C-section 09/12/2019   Alpha thalassemia silent carrier 03/21/2019   Adopted 02/28/2019   Obesity (BMI 30-39.9) 02/28/2019   H/O cesarean section 02/20/2019   Asthma 04/12/2007   Past Surgical History:  Procedure Laterality Date   CESAREAN SECTION     CESAREAN SECTION N/A 09/12/2019   Procedure: CESAREAN SECTION;  Surgeon: Federico Flake, MD;  Location: [facility] LD ORS;  Service: Obstetrics;  Laterality: N/A;   DILATION AND CURETTAGE OF UTERUS     OB History     Gravida  6   Para  4   Term  4   Preterm  0   AB  2   Living  4      SAB  0   IAB  2   Ectopic  0   Multiple  0   Live Births  4          Home Medications    Prior to Admission medications   Medication Sig Start Date End Date Taking? Authorizing Provider  clindamycin (CLEOCIN T) 1 % SWAB Apply to face every morning. 12/20/22   Terri Piedra, DO  doxycycline (MONODOX) 100 MG capsule Take  1 capsule once daily with dinner. 12/20/22   Terri Piedra, DO  etonogestrel-ethinyl estradiol (NUVARING) 0.12-0.015 MG/24HR vaginal ring Insert vaginally and leave in place for 3 consecutive weeks, then remove for 1 week. Patient not taking: Reported on 07/27/2022 10/17/19   Adam Phenix, MD  tretinoin (RETIN-A) 0.025 % cream Apply pea-sized amount to entire face 3 nights per week, wash off in morning. 12/20/22   Terri Piedra, DO    Family History Family History  Adopted: Yes  Problem Relation Age of Onset   Other Neg Hx    Social History Social History   Tobacco Use   Smoking status: Never   Smokeless tobacco: Never  Vaping Use   Vaping status: Never Used  Substance Use Topics   Alcohol use: No   Drug use: No   Allergies   Patient has no known allergies.  Review of Systems Review of Systems Pertinent findings revealed after performing a 14 point review of systems has been noted in the history of present illness.  Physical Exam Vital Signs BP 119/77 (BP Location: Left Arm)   Pulse 69   Temp 98.1 F (  36.7 C) (Oral)   Resp 16   Ht 5\' 4"  (1.626 m)   Wt 240 lb (108.9 kg)   SpO2 98%   BMI 41.20 kg/m   No data found.  Physical Exam Vitals and nursing note reviewed.  Constitutional:      General: She is not in acute distress.    Appearance: Normal appearance. She is not ill-appearing.  HENT:     Head: Normocephalic and atraumatic.     Salivary Glands: Right salivary gland is not diffusely enlarged or tender. Left salivary gland is not diffusely enlarged or tender.     Right Ear: Tympanic membrane, ear canal and external ear normal. No drainage. No middle ear effusion. There is no impacted cerumen. Tympanic membrane is not erythematous or bulging.     Left Ear: Tympanic membrane, ear canal and external ear normal. No drainage.  No middle ear effusion. There is no impacted cerumen. Tympanic membrane is not erythematous or bulging.     Nose: Nose normal. No nasal  deformity, septal deviation, mucosal edema, congestion or rhinorrhea.     Right Turbinates: Not enlarged, swollen or pale.     Left Turbinates: Not enlarged, swollen or pale.     Right Sinus: No maxillary sinus tenderness or frontal sinus tenderness.     Left Sinus: No maxillary sinus tenderness or frontal sinus tenderness.     Mouth/Throat:     Lips: Pink. No lesions.     Mouth: Mucous membranes are moist. No oral lesions.     Pharynx: Oropharynx is clear. Uvula midline. No posterior oropharyngeal erythema or uvula swelling.     Tonsils: No tonsillar exudate. 0 on the right. 0 on the left.  Eyes:     General: Lids are normal.        Right eye: No discharge.        Left eye: No discharge.     Extraocular Movements: Extraocular movements intact.     Conjunctiva/sclera: Conjunctivae normal.     Right eye: Right conjunctiva is not injected.     Left eye: Left conjunctiva is not injected.  Neck:     Trachea: Trachea and phonation normal.  Cardiovascular:     Rate and Rhythm: Normal rate and regular rhythm.     Pulses: Normal pulses.     Heart sounds: Normal heart sounds. No murmur heard.    No friction rub. No gallop.  Pulmonary:     Effort: Pulmonary effort is normal. No accessory muscle usage, prolonged expiration or respiratory distress.     Breath sounds: No stridor, decreased air movement or transmitted upper airway sounds. Examination of the right-lower field reveals rhonchi. Examination of the left-lower field reveals rhonchi. Rhonchi present. No decreased breath sounds, wheezing or rales.  Chest:     Chest wall: No tenderness.  Musculoskeletal:        General: Normal range of motion.     Cervical back: Normal range of motion and neck supple. Normal range of motion.  Lymphadenopathy:     Cervical: No cervical adenopathy.  Skin:    General: Skin is warm and dry.     Findings: No erythema or rash.  Neurological:     General: No focal deficit present.     Mental Status: She is  alert and oriented to person, place, and time.  Psychiatric:        Mood and Affect: Mood normal.        Behavior: Behavior normal.  Visual Acuity Right Eye Distance:   Left Eye Distance:   Bilateral Distance:    Right Eye Near:   Left Eye Near:    Bilateral Near:     UC Couse / Diagnostics / Procedures:     Radiology No results found.  Procedures Procedures (including critical care time) EKG  Pending results:  Labs Reviewed - No data to display  Medications Ordered in UC: Medications  albuterol (PROVENTIL) (2.5 MG/3ML) 0.083% nebulizer solution 2.5 mg (has no administration in time range)    UC Diagnoses / Final Clinical Impressions(s)   I have reviewed the triage vital signs and the nursing notes.  Pertinent labs & imaging results that were available during my care of the patient were reviewed by me and considered in my medical decision making (see chart for details).    Final diagnoses:  Acute bronchitis, unspecified organism   Patient advised that physical exam findings are still concerning for bronchitis and that it is not surprising that she does not feel better after 2 days of azithromycin as the bronchitis is likely viral and it can take several weeks to completely recover from bronchitis.  Patient was provided with an albuterol nebulizer treatment during her visit which she reports improved her work of breathing.  Patient advised to continue albuterol at home, provided with a prescription for [facility] as well as nebulizer solution that she can use with her son's nebulizer machine.  Patient advised she can take Robitussin to help promote expectoration which will also ease the work of coughing, she should continue the prednisone which was prescribed by the provider at [facility].  Promethazine DM provided for nighttime cough so patient can get some rest.  Conservative care recommended.  Return precautions advised.  Please see discharge instructions  below for details of plan of care as provided to patient. ED Prescriptions     Medication Sig Dispense Auth. Provider   albuterol (VENTOLIN HFA) 108 (90 Base) MCG/ACT inhaler Inhale 2 puffs into the lungs every 6 (six) hours as needed for wheezing or shortness of breath (Cough). 18 g Theadora Rama Scales, PA-C   guaifenesin (HUMIBID E) 400 MG TABS tablet Take 1 tablet 3 times daily as needed for chest congestion and cough 30 tablet Theadora Rama Scales, PA-C   promethazine-dextromethorphan (PROMETHAZINE-DM) 6.25-15 MG/5ML syrup Take 5 mLs by mouth at bedtime as needed for cough. 60 mL Theadora Rama Scales, PA-C   albuterol (PROVENTIL) (2.5 MG/3ML) 0.083% nebulizer solution Take 3 mLs (2.5 mg total) by nebulization every 6 (six) hours as needed for up to 14 days for wheezing or shortness of breath. 168 mL Theadora Rama Scales, PA-C      PDMP not reviewed this encounter.  Pending results:  Labs Reviewed - No data to display    Discharge Instructions      The x-ray of your chest was not concerning for pneumonia, findings do appear to be consistent with acute bronchitis.  Please read below to learn more about the medications, dosages and frequencies, in addition to azithromycin and prednisone, that I recommend to help alleviate your symptoms and to get you feeling better soon:   ProAir, Ventolin, Proventil (albuterol): This inhaled medication contains a short acting beta agonist bronchodilator.  This medication relaxes the smooth muscle of the airway in the lungs.  When these muscles are tight, breathing becomes more constricted.  The result of relaxation of the smooth muscle is increased air movement and improved work of breathing.  This  is a short acting medication that can be used every 4-6 hours as needed for increased work of breathing, shortness of breath, wheezing and excessive coughing.  It comes in the form of a handheld inhaler or nebulizer solution.  I recommended that for the  next 3 to 4 days, this medication is used 4 times daily on a scheduled basis then decrease to twice daily and as needed until symptoms have completely resolved which I anticipate will be several weeks.   Robitussin, Mucinex (guaifenesin): This is an expectorant.  This single symptom reliever helps break up chest congestion and loosen up thick nasal drainage making phlegm and drainage easier to cough up and to blow out from your nose.  I recommend taking 400 mg in either liquid or tablet form three times daily as needed.  I do not recommend the 12-hour extended relief version or doses higher than 400 mg per each dose as these often make some patients feel jittery or jumpy and can interfere with sleep.  I also do not recommend that you purchase guaifenesin with the ingredient " DM" which is dextromethorphan, a cough suppressant and that you plan on taking it at bedtime.  Guaifenesin 400 mg is a safe dose for people who are being treated for high blood pressure.  This medication is available over-the-counter.   Promethazine DM: Promethazine is both a nasal decongestant that dries up mucous membranes and an antinausea medication.  Promethazine often makes most patients feel fairly sleepy.  "DM" is dextromethorphan, a single symptom reliever which is a cough suppressant found in many over-the-counter cough medications and combination cold preparations.  Please take 5 mL before bedtime to minimize your cough which will help you sleep better.  I have sent a prescription for this medication to your pharmacy because it cannot be purchased over-the-counter.   If symptoms have not meaningfully improved in the next 10 to 14 days, please return for repeat evaluation or follow-up with your regular provider.  If symptoms have worsened in the next 3 to 5 days, please return for repeat evaluation or follow-up with your regular provider.    Thank you for visiting urgent care today.  We appreciate the opportunity to  participate in your care.       Disposition Upon Discharge:  Condition: stable for discharge home  Patient presented with an acute illness with associated systemic symptoms and significant discomfort requiring urgent management. In my opinion, this is a condition that a prudent lay person (someone who possesses an average knowledge of health and medicine) may potentially expect to result in complications if not addressed urgently such as respiratory distress, impairment of bodily function or dysfunction of bodily organs.   Routine symptom specific, illness specific and/or disease specific instructions were discussed with the patient and/or caregiver at length.   As such, the patient has been evaluated and assessed, work-up was performed and treatment was provided in alignment with urgent care protocols and evidence based medicine.  Patient/parent/caregiver has been advised that the patient may require follow up for further testing and treatment if the symptoms continue in spite of treatment, as clinically indicated and appropriate.  Patient/parent/caregiver has been advised to return to the [facility] or PCP if no better; to PCP or the Emergency Department if new signs and symptoms develop, or if the current signs or symptoms continue to change or worsen for further workup, evaluation and treatment as clinically indicated and appropriate  The patient will follow up with their current PCP if  and as advised. If the patient does not currently have a PCP we will assist them in obtaining one.   The patient may need specialty follow up if the symptoms continue, in spite of conservative treatment and management, for further workup, evaluation, consultation and treatment as clinically indicated and appropriate.  Patient/parent/caregiver verbalized understanding and agreement of plan as discussed.  All questions were addressed during visit.  Please see discharge instructions below for further details of  plan.  This office note has been dictated using Teaching laboratory technician.  Unfortunately, this method of dictation can sometimes lead to typographical or grammatical errors.  I apologize for your inconvenience in advance if this occurs.  Please do not hesitate to reach out to me if clarification is needed.      Theadora Rama Scales, [place] 06/24/23 2202

## 2023-06-24 NOTE — Discharge Instructions (Addendum)
The x-ray of your chest was not concerning for pneumonia, findings do appear to be consistent with acute bronchitis.  Please read below to learn more about the medications, dosages and frequencies, in addition to azithromycin and prednisone, that I recommend to help alleviate your symptoms and to get you feeling better soon:   ProAir, Ventolin, Proventil (albuterol): This inhaled medication contains a short acting beta agonist bronchodilator.  This medication relaxes the smooth muscle of the airway in the lungs.  When these muscles are tight, breathing becomes more constricted.  The result of relaxation of the smooth muscle is increased air movement and improved work of breathing.  This is a short acting medication that can be used every 4-6 hours as needed for increased work of breathing, shortness of breath, wheezing and excessive coughing.  It comes in the form of a handheld inhaler or nebulizer solution.  I recommended that for the next 3 to 4 days, this medication is used 4 times daily on a scheduled basis then decrease to twice daily and as needed until symptoms have completely resolved which I anticipate will be several weeks.   Robitussin, Mucinex (guaifenesin): This is an expectorant.  This single symptom reliever helps break up chest congestion and loosen up thick nasal drainage making phlegm and drainage easier to cough up and to blow out from your nose.  I recommend taking 400 mg in either liquid or tablet form three times daily as needed.  I do not recommend the 12-hour extended relief version or doses higher than 400 mg per each dose as these often make some patients feel jittery or jumpy and can interfere with sleep.  I also do not recommend that you purchase guaifenesin with the ingredient " DM" which is dextromethorphan, a cough suppressant and that you plan on taking it at bedtime.  Guaifenesin 400 mg is a safe dose for people who are being treated for high blood pressure.  This medication is  available over-the-counter.   Promethazine DM: Promethazine is both a nasal decongestant that dries up mucous membranes and an antinausea medication.  Promethazine often makes most patients feel fairly sleepy.  "DM" is dextromethorphan, a single symptom reliever which is a cough suppressant found in many over-the-counter cough medications and combination cold preparations.  Please take 5 mL before bedtime to minimize your cough which will help you sleep better.  I have sent a prescription for this medication to your pharmacy because it cannot be purchased over-the-counter.   If symptoms have not meaningfully improved in the next 10 to 14 days, please return for repeat evaluation or follow-up with your regular provider.  If symptoms have worsened in the next 3 to 5 days, please return for repeat evaluation or follow-up with your regular provider.    Thank you for visiting urgent care today.  We appreciate the opportunity to participate in your care.

## 2023-06-24 NOTE — Progress Notes (Signed)
Please advise patient that chest x-ray is not concerning for pneumonia at this time and bronchitis that she is experiencing right now is mild enough that is also not showing up on her chest x-ray.  Recommend continue current plan of care.

## 2023-06-24 NOTE — ED Triage Notes (Signed)
Patient states she have been having sharp burning pain in her chest, off and on, stuffy nose, sneezing. Patient states she have been taking her Z pack and symptoms are not getting any better.

## 2023-09-07 ENCOUNTER — Ambulatory Visit
Admission: RE | Admit: 2023-09-07 | Discharge: 2023-09-07 | Disposition: A | Discharge status: Home / Self Care | Payer: Medicaid Other | Source: Ambulatory Visit | Attending: Family | Admitting: Family

## 2023-09-07 VITALS — BP 118/78 | HR 78 | Temp 98.2°F | Resp 18 | Ht 64.0 in | Wt 240.0 lb

## 2023-09-07 DIAGNOSIS — L292 Pruritus vulvae: Secondary | ICD-10-CM | POA: Diagnosis not present

## 2023-09-07 DIAGNOSIS — D563 Thalassemia minor: Secondary | ICD-10-CM | POA: Insufficient documentation

## 2023-09-07 DIAGNOSIS — N898 Other specified noninflammatory disorders of vagina: Secondary | ICD-10-CM | POA: Insufficient documentation

## 2023-09-07 DIAGNOSIS — Z113 Encounter for screening for infections with a predominantly sexual mode of transmission: Secondary | ICD-10-CM | POA: Insufficient documentation

## 2023-09-07 DIAGNOSIS — Z8742 Personal history of other diseases of the female genital tract: Secondary | ICD-10-CM | POA: Diagnosis present

## 2023-09-07 DIAGNOSIS — J45909 Unspecified asthma, uncomplicated: Secondary | ICD-10-CM | POA: Diagnosis not present

## 2023-09-07 MED ORDER — FLUCONAZOLE 150 MG PO TABS: 150.0000 mg | ORAL_TABLET | Freq: Once | ORAL | 0 refills | Status: AC

## 2023-09-07 NOTE — ED Provider Notes (Signed)
 [facility]    CSN: [id number] Arrival date & time: 09/07/23  1548      History   Chief Complaint Chief Complaint  Patient presents with   Vaginal Itching    Entered by patient    HPI [name] is a 38 y.o. female.   38 year old female presents with vaginal itching and white to clear vaginal discharge for the past 2 days. Denies any distinct odor or lesions. Denies any fever, abdominal pain or dysuria.   The history is provided by the patient.  Vaginal Itching    Past Medical History:  Diagnosis Date   Acne    Anemia    Chlamydia    HSV (herpes simplex virus) infection    Infection    UTI    Patient Active Problem List   Diagnosis Date Noted   S/P repeat low transverse C-section 09/12/2019   Alpha thalassemia silent carrier 03/21/2019   Adopted 02/28/2019   Obesity (BMI 30-39.9) 02/28/2019   H/O cesarean section 02/20/2019   Asthma 04/12/2007    Past Surgical History:  Procedure Laterality Date   CESAREAN SECTION     CESAREAN SECTION N/A 09/12/2019   Procedure: CESAREAN SECTION;  Surgeon: Federico Flake, MD;  Location: [facility] LD [facility]S;  Service: Obstetrics;  Laterality: N/A;   DILATION AND CURETTAGE OF UTERUS      OB History     Gravida  6   Para  4   Term  4   Preterm  0   AB  2   Living  4      SAB  0   IAB  2   Ectopic  0   Multiple  0   Live Births  4            Home Medications    Prior to Admission medications   Medication Sig Start Date End Date Taking? Authorizing Provider  clindamycin (CLEOCIN T) 1 % SWAB Apply to face every morning. 12/20/22  Yes Terri Piedra, DO  etonogestrel-ethinyl estradiol (NUVARING) 0.12-0.015 MG/24HR vaginal ring Insert vaginally and leave in place for 3 consecutive weeks, then remove for 1 week. 10/17/19  Yes Adam Phenix, MD  fluconazole (DIFLUCAN) 150 MG tablet Take 1 tablet (150 mg total) by mouth once for 1 dose. May repeat 1 tablet in 3 days if needed 09/07/23  09/07/23 Yes Aking Klabunde, Ali Lowe, NP  sertraline (ZOLOFT) 50 MG tablet Take 1 tablet by mouth daily. 07/15/23  Yes [provider]  tretinoin (RETIN-A) 0.025 % cream Apply pea-sized amount to entire face 3 nights per week, wash off in morning. 12/20/22  Yes Terri Piedra, DO  WEGOVY 0.25 MG/0.5ML SOAJ Inject 0.25 mg into the skin. 08/22/23 09/21/23 Yes [provider]  albuterol (PROVENTIL) (2.5 MG/3ML) 0.083% nebulizer solution Take 3 mLs (2.5 mg total) by nebulization every 6 (six) hours as needed for up to 14 days for wheezing or shortness of breath. 06/24/23 07/08/23  Theadora Rama Scales, PA-C  albuterol (VENTOLIN HFA) 108 (90 Base) MCG/ACT inhaler Inhale 2 puffs into the lungs every 6 (six) hours as needed for wheezing or shortness of breath (Cough). 06/24/23   Theadora Rama Scales, PA-C    Family History Family History  Adopted: Yes  Problem Relation Age of Onset   Other Neg Hx     Social History Social History   Tobacco Use   Smoking status: Never   Smokeless tobacco: Never  Vaping Use  Vaping status: Never Used  Substance Use Topics   Alcohol use: No   Drug use: No     Allergies   Patient has no known allergies.   Review of Systems Review of Systems   Physical Exam Triage Vital Signs ED Triage Vitals  Encounter Vitals Group     BP 09/07/23 1613 118/78     Systolic BP Percentile --      Diastolic BP Percentile --      Pulse Rate 09/07/23 1613 78     Resp 09/07/23 1613 18     Temp 09/07/23 1613 98.2 F (36.8 C)     Temp Source 09/07/23 1613 Oral     SpO2 09/07/23 1613 97 %     Weight 09/07/23 1614 240 lb (108.9 kg)     Height 09/07/23 1614 5\' 4"  (1.626 m)     Head Circumference --      Peak Flow --      Pain Score 09/07/23 1614 0     Pain Loc --      Pain Education --      Exclude from Growth Chart --    No data found.  Updated Vital Signs BP 118/78 (BP Location: Left Arm)   Pulse 78   Temp 98.2 F (36.8 C) (Oral)   Resp 18    Ht 5\' 4"  (1.626 m)   Wt 240 lb (108.9 kg)   SpO2 97%   BMI 41.20 kg/m   Visual Acuity Right Eye Distance:   Left Eye Distance:   Bilateral Distance:    Right Eye Near:   Left Eye Near:    Bilateral Near:     Physical Exam   UC Treatments / Results  Labs (all labs ordered are listed, but only abnormal results are displayed) Labs Reviewed  HIV ANTIBODY (ROUTINE TESTING W REFLEX)  RPR  CERVICOVAGINAL ANCILLARY ONLY    EKG   Radiology No results found.  Procedures Procedures (including critical care time)  Medications Ordered in UC Medications - No data to display  Initial Impression / Assessment and Plan / UC Course  I have reviewed the triage vital signs and the nursing notes.  Pertinent labs & imaging results that were available during my care of the patient were reviewed by me and considered in my medical decision making (see chart for details).     *** Final Clinical Impressions(s) / UC Diagnoses   Final diagnoses:  Vaginal discharge  Screening for STD (sexually transmitted disease)  Vaginal itching     Discharge Instructions      Recommend start Diflucan 150mg  one tablet now- may repeat 1 tablet in 3 days if lab results are positive for yeast. Recommend continue to monitor symptoms. No sexual intercourse for at least 5 days. Follow-up pending lab results.     ED Prescriptions     Medication Sig Dispense Auth. Provider   fluconazole (DIFLUCAN) 150 MG tablet Take 1 tablet (150 mg total) by mouth once for 1 dose. May repeat 1 tablet in 3 days if needed 2 tablet Miliani Deike, Ali Lowe, NP      PDMP not reviewed this encounter.

## 2023-09-07 NOTE — ED Triage Notes (Signed)
 Patient c/o vaginal itching, discharge, denies odor x 2 days.  Patient would like STI testing.  Denies any OTC yeast medication.  Wants bloodwork as well.

## 2023-09-07 NOTE — Discharge Instructions (Signed)
 Recommend start Diflucan 150mg  one tablet now- may repeat 1 tablet in 3 days if lab results are positive for yeast. Recommend continue to monitor symptoms. No sexual intercourse for at least 5 days. Follow-up pending lab results.

## 2023-09-08 ENCOUNTER — Encounter: Payer: Self-pay | Admitting: Family

## 2023-09-08 LAB — CERVICOVAGINAL ANCILLARY ONLY
Bacterial Vaginitis (gardnerella): NEGATIVE
Candida Glabrata: NEGATIVE
Candida Vaginitis: NEGATIVE
Chlamydia: NEGATIVE
Comment: NEGATIVE
Comment: NEGATIVE
Comment: NEGATIVE
Comment: NEGATIVE
Comment: NEGATIVE
Comment: NORMAL
Neisseria Gonorrhea: NEGATIVE
Trichomonas: NEGATIVE

## 2023-09-08 LAB — HIV ANTIBODY (ROUTINE TESTING W REFLEX): HIV Screen 4th Generation wRfx: NONREACTIVE

## 2023-09-08 LAB — RPR: RPR Ser Ql: NONREACTIVE

## 2023-11-16 DIAGNOSIS — F411 Generalized anxiety disorder: Secondary | ICD-10-CM | POA: Insufficient documentation

## 2024-02-20 ENCOUNTER — Encounter: Payer: Self-pay | Admitting: Dermatology

## 2024-02-20 ENCOUNTER — Other Ambulatory Visit: Payer: Self-pay

## 2024-02-20 MED ORDER — TRETINOIN 0.025 % EX CREA: TOPICAL_CREAM | CUTANEOUS | 0 refills | Status: DC

## 2024-03-10 ENCOUNTER — Telehealth: Payer: Self-pay

## 2024-03-10 ENCOUNTER — Ambulatory Visit
Admission: EM | Admit: 2024-03-10 | Discharge: 2024-03-10 | Disposition: A | Discharge status: Home / Self Care | Attending: Nurse Practitioner | Admitting: Nurse Practitioner

## 2024-03-10 DIAGNOSIS — K5900 Constipation, unspecified: Secondary | ICD-10-CM

## 2024-03-10 DIAGNOSIS — K649 Unspecified hemorrhoids: Secondary | ICD-10-CM

## 2024-03-10 MED ORDER — PRAMOXINE-HC 1-2.5 % EX OINT: TOPICAL_OINTMENT | CUTANEOUS | 0 refills | Status: DC

## 2024-03-10 MED ORDER — SENNOSIDES-DOCUSATE SODIUM 8.6-50 MG PO TABS: 1.0000 | ORAL_TABLET | Freq: Every day | ORAL | 0 refills | Status: AC

## 2024-03-10 MED ORDER — PRAMOXINE-HC 1-2.5 % EX OINT: TOPICAL_OINTMENT | CUTANEOUS | 0 refills | Status: AC

## 2024-03-10 MED ORDER — HYDROCORTISONE ACETATE 25 MG RE SUPP: 25.0000 mg | Freq: Two times a day (BID) | RECTAL | 0 refills | Status: DC

## 2024-03-10 NOTE — Telephone Encounter (Signed)
 She said would you send to [facility] [address] please.   She will purchase the others as well.  Redell HERO.

## 2024-03-10 NOTE — Telephone Encounter (Signed)
 Patient dropping back by Urgent Care to let us  know the following Rx's are not covered by Insurance [facility]) and would like and alternative.

## 2024-03-10 NOTE — ED Notes (Signed)
 This RN at bedside for rectal exam performed by CANDIE Sarin, FNP. Pt tolerated well.

## 2024-03-10 NOTE — Discharge Instructions (Addendum)
 You have been diagnosed with hemorrhoids, which can cause discomfort, itching, and pain in the rectal area. To manage your symptoms, you have been prescribed Anusol -HC suppositories to be inserted into the rectum twice daily for up to 7 days, and pramoxine-HC ointment to be applied in a thin layer to the affected area four times daily, especially after bowel movements, for no more than 7 days.  At home, keep the affected area clean by gently washing with warm water . Avoid using soap with harsh chemicals or fragrances. Use soft, moist toilet paper or wipes to prevent further irritation. Consider soaking in a warm sitz bath for 10-15 minutes a few times a day, particularly after bowel movements, to relieve discomfort. Avoid straining during bowel movements and increase fiber intake or use a stool softener if needed. Wear loose, comfortable clothing to help minimize irritation.  If your symptoms do not improve or worsen, follow up with your primary care provider. If you experience severe pain, bleeding, or any concerning symptoms, seek immediate care at the emergency department or contact your healthcare provider.  Please follow the prescribed treatment plan and complete the full course of medications. If you notice heavy bleeding, blood in your stool, fever, increased pain, or signs of infection such as redness, swelling, or discharge, seek immediate medical attention.

## 2024-03-10 NOTE — ED Triage Notes (Signed)
 Pt c/o pain at rectum that began yesterday. Believes she has a hemorrhoid. No hx of same. Has tried cream without relief. Denies any bleeding.

## 2024-03-10 NOTE — Telephone Encounter (Signed)
 There are no alternative treatments that can be prescribed at this time. The senna-docusate and hydrocortisone  are available over the counter. However, the hydrocortisone /pramoxine cream is the most essential medication for pain relief and is only available by prescription. For self-pay options, GoodRx may be helpful. [facility] offers it for $40.47, which is more affordable compared to other pharmacies. Please ask patient if she would like me to send the prescription to [facility]. Otherwise, I recommend continuing with supportive care for comfort and following up with GI provider first thing Monday morning to arrange an appointment as soon as possible.

## 2024-03-10 NOTE — ED Provider Notes (Signed)
 [facility]    CSN: [id number] Arrival date & time: 03/10/24  0805      History   Chief Complaint Chief Complaint  Patient presents with   Hemorrhoids    HPI [name] is a 38 y.o. female.   Discussed the use of AI scribe software for clinical note transcription with the patient, who gave verbal consent to proceed.   The patient presents with complaints of hemorrhoids, which began yesterday. This is the first time the patient has experienced hemorrhoids, and there is no reported bleeding or itching associated with them. The symptoms are complicated by recent changes in bowel habits due to the use of Wegovy. For the past two months, the patient has experienced alterations in bowel movements, initially presenting with diarrhea, which has since shifted to constipation. The patient reports that bowel movements have become hard over this period, and in the last two weeks, the constipation has worsened significantly, requiring the use of an enema and causing straining during defecation. However, the patient notes that they had not been straining in the days immediately preceding the onset of hemorrhoids. The patient has attempted to treat the hemorrhoids with over-the-counter Preparation H, which was started yesterday, but it has not provided significant relief.  The following portions of the patient's history were reviewed and updated as appropriate: allergies, current medications, past family history, past medical history, past social history, past surgical history, and problem list. The following portions of the patient's history were reviewed and updated as appropriate: allergies, current medications, past family history, past medical history, past social history, past surgical history, and problem list.     Past Medical History:  Diagnosis Date   Acne    Anemia    Chlamydia    HSV (herpes simplex virus) infection    Infection    UTI    Patient Active Problem  List   Diagnosis Date Noted   S/P repeat low transverse C-section 09/12/2019   Alpha thalassemia silent carrier 03/21/2019   Adopted 02/28/2019   Obesity (BMI 30-39.9) 02/28/2019   H/O cesarean section 02/20/2019   Asthma 04/12/2007    Past Surgical History:  Procedure Laterality Date   CESAREAN SECTION     CESAREAN SECTION N/A 09/12/2019   Procedure: CESAREAN SECTION;  Surgeon: Eldonna Suzen Octave, MD;  Location: [facility] ORS;  Service: Obstetrics;  Laterality: N/A;   DILATION AND CURETTAGE OF UTERUS      OB History     Gravida  6   Para  4   Term  4   Preterm  0   AB  2   Living  4      SAB  0   IAB  2   Ectopic  0   Multiple  0   Live Births  4            Home Medications    Prior to Admission medications   Medication Sig Start Date End Date Taking? Authorizing Provider  hydrocortisone  (ANUSOL -HC) 25 MG suppository Place 1 suppository (25 mg total) rectally 2 (two) times daily. 03/10/24  Yes Iola Lukes, FNP  Pramoxine-HC 1-2.5 % OINT Apply a thin layer of pramoxine-HC ointment to the affected area up to 4 times daily, especially after bowel movements. Do not use more than 7 days unless. 03/10/24  Yes Aiysha Jillson, Lukes, FNP  senna-docusate (SENOKOT-S) 8.6-50 MG tablet Take 1 tablet by mouth at bedtime. 03/10/24  Yes Iola Lukes, FNP  albuterol  (PROVENTIL ) (2.5 MG/3ML) 0.083%  nebulizer solution Take 3 mLs (2.5 mg total) by nebulization every 6 (six) hours as needed for up to 14 days for wheezing or shortness of breath. 06/24/23 07/08/23  Joesph Shaver Scales, PA-C  albuterol  (VENTOLIN  HFA) 108 (90 Base) MCG/ACT inhaler Inhale 2 puffs into the lungs every 6 (six) hours as needed for wheezing or shortness of breath (Cough). 06/24/23   Joesph Shaver Scales, PA-C  clindamycin  (CLEOCIN  T) 1 % SWAB Apply to face every morning. 12/20/22   Alm Delon SAILOR, DO  etonogestrel -ethinyl estradiol  (NUVARING) 0.12-0.015 MG/24HR vaginal ring Insert vaginally  and leave in place for 3 consecutive weeks, then remove for 1 week. 10/17/19   Arnold, James G, MD  sertraline (ZOLOFT) 50 MG tablet Take 1 tablet by mouth daily. 07/15/23   [provider]  tretinoin  (RETIN-A ) 0.025 % cream Apply pea-sized amount to entire face 3 nights per week, wash off in morning. 02/20/24   Alm Delon SAILOR, DO    Family History Family History  Adopted: Yes  Problem Relation Age of Onset   Other Neg Hx     Social History Social History   Tobacco Use   Smoking status: Never   Smokeless tobacco: Never  Vaping Use   Vaping status: Never Used  Substance Use Topics   Alcohol use: No   Drug use: No     Allergies   Patient has no known allergies.   Review of Systems Review of Systems  Gastrointestinal:  Positive for constipation and rectal pain. Negative for abdominal pain, anal bleeding, blood in stool, nausea and vomiting.       Hemorrhoids  All other systems reviewed and are negative.    Physical Exam Triage Vital Signs ED Triage Vitals  Encounter Vitals Group     BP 03/10/24 0823 125/84     Girls Systolic BP Percentile --      Girls Diastolic BP Percentile --      Boys Systolic BP Percentile --      Boys Diastolic BP Percentile --      Pulse Rate 03/10/24 0823 73     Resp 03/10/24 0823 18     Temp 03/10/24 0823 98.2 F (36.8 C)     Temp Source 03/10/24 0823 Oral     SpO2 03/10/24 0823 98 %     Weight --      Height --      Head Circumference --      Peak Flow --      Pain Score 03/10/24 0822 8     Pain Loc --      Pain Education --      Exclude from Growth Chart --    No data found.  Updated Vital Signs BP 125/84 (BP Location: Right Arm)   Pulse 73   Temp 98.2 F (36.8 C) (Oral)   Resp 18   SpO2 98%   Visual Acuity Right Eye Distance:   Left Eye Distance:   Bilateral Distance:    Right Eye Near:   Left Eye Near:    Bilateral Near:     Physical Exam Vitals reviewed. Exam conducted with a chaperone present LONNY Bailey, RN).  Constitutional:      General: She is awake. She is not in acute distress.    Appearance: Normal appearance. She is well-developed. She is not ill-appearing, toxic-appearing or diaphoretic.  HENT:     Head: Normocephalic.     Right Ear: Hearing normal.     Left Ear:  Hearing normal.     Nose: Nose normal.     Mouth/Throat:     Mouth: Mucous membranes are moist.  Eyes:     General: Vision grossly intact.     Conjunctiva/sclera: Conjunctivae normal.  Cardiovascular:     Rate and Rhythm: Normal rate and regular rhythm.     Heart sounds: Normal heart sounds.  Pulmonary:     Effort: Pulmonary effort is normal.     Breath sounds: Normal breath sounds and air entry.  Genitourinary:     Comments: A single, large, non-thrombosed hemorrhoid is noted in the right lateral anal area, which is extremely tender to palpation. No internal hemorrhoids were identified upon digital examination.  Musculoskeletal:        General: Normal range of motion.     Cervical back: Full passive range of motion without pain, normal range of motion and neck supple.  Skin:    General: Skin is warm and dry.  Neurological:     General: No focal deficit present.     Mental Status: She is alert and oriented to person, place, and time.  Psychiatric:        Speech: Speech normal.        Behavior: Behavior is cooperative.      UC Treatments / Results  Labs (all labs ordered are listed, but only abnormal results are displayed) Labs Reviewed - No data to display  EKG   Radiology No results found.  Procedures Procedures (including critical care time)  Medications Ordered in UC Medications - No data to display  Initial Impression / Assessment and Plan / UC Course  I have reviewed the triage vital signs and the nursing notes.  Pertinent labs & imaging results that were available during my care of the patient were reviewed by me and considered in my medical decision making (see chart for  details).     The patient presents with complaints of hemorrhoids, accompanied by a recent history of constipation over the past couple of months. The patient reports straining with bowel movements. On examination, a single, large, non-thrombosed hemorrhoid is noted in the right lateral anal area, which is extremely tender to palpation. No internal hemorrhoids were identified on digital examination. The diagnosis of hemorrhoids was confirmed. To manage the symptoms, the patient was prescribed Anusol -HC suppositories to be inserted twice daily for up to 7 days and pramoxine-HC ointment to be applied in a thin layer to the affected area four times daily, particularly after bowel movements, for no more than 7 days.  At home, the patient is advised to keep the affected area clean with warm water , avoiding harsh soaps or fragrances. Use of soft, moist toilet paper or wipes is recommended to prevent further irritation. Soaking in a warm sitz bath for 10-15 minutes, particularly after bowel movements, may provide relief. The patient is encouraged to avoid straining during bowel movements, increase fiber intake, or use a stool softener if necessary. Loose, comfortable clothing should be worn to minimize irritation.  If the symptoms do not improve or worsen, the patient should follow up with their primary care provider. In the event of severe pain, bleeding, or any concerning symptoms, the patient should seek immediate care at the emergency department or contact their healthcare provider.   The following portions of the patient's history were reviewed and updated as appropriate: allergies, current medications, past family history, past medical history, past social history, past surgical history, and problem list. Final Clinical Impressions(s) / UC Diagnoses  Final diagnoses:  Hemorrhoids, unspecified hemorrhoid type  Constipation, unspecified constipation type     Discharge Instructions      You have  been diagnosed with hemorrhoids, which can cause discomfort, itching, and pain in the rectal area. To manage your symptoms, you have been prescribed Anusol -HC suppositories to be inserted into the rectum twice daily for up to 7 days, and pramoxine-HC ointment to be applied in a thin layer to the affected area four times daily, especially after bowel movements, for no more than 7 days.  At home, keep the affected area clean by gently washing with warm water . Avoid using soap with harsh chemicals or fragrances. Use soft, moist toilet paper or wipes to prevent further irritation. Consider soaking in a warm sitz bath for 10-15 minutes a few times a day, particularly after bowel movements, to relieve discomfort. Avoid straining during bowel movements and increase fiber intake or use a stool softener if needed. Wear loose, comfortable clothing to help minimize irritation.  If your symptoms do not improve or worsen, follow up with your primary care provider. If you experience severe pain, bleeding, or any concerning symptoms, seek immediate care at the emergency department or contact your healthcare provider.  Please follow the prescribed treatment plan and complete the full course of medications. If you notice heavy bleeding, blood in your stool, fever, increased pain, or signs of infection such as redness, swelling, or discharge, seek immediate medical attention.      ED Prescriptions     Medication Sig Dispense Auth. Provider   Pramoxine-HC 1-2.5 % OINT Apply a thin layer of pramoxine-HC ointment to the affected area up to 4 times daily, especially after bowel movements. Do not use more than 7 days unless. 28.4 g Iola Lukes, FNP   hydrocortisone  (ANUSOL -HC) 25 MG suppository Place 1 suppository (25 mg total) rectally 2 (two) times daily. 12 suppository Garv Kuechle, [place], FNP   senna-docusate (SENOKOT-S) 8.6-50 MG tablet Take 1 tablet by mouth at bedtime. 5 tablet Iola Lukes, FNP       PDMP not reviewed this encounter.   Iola [place], [place] 03/10/24 [phone]

## 2024-03-10 NOTE — Telephone Encounter (Signed)
 The hydrocortisone /pramoxine cream prescription has been sent to the [facility] on [id number]

## 2024-03-11 ENCOUNTER — Ambulatory Visit

## 2024-04-06 ENCOUNTER — Ambulatory Visit

## 2024-04-07 ENCOUNTER — Ambulatory Visit
Admission: RE | Admit: 2024-04-07 | Discharge: 2024-04-07 | Disposition: A | Discharge status: Home / Self Care | Source: Ambulatory Visit | Attending: Nurse Practitioner | Admitting: Nurse Practitioner

## 2024-04-07 VITALS — BP 107/73 | HR 88 | Temp 98.8°F | Resp 18

## 2024-04-07 DIAGNOSIS — J069 Acute upper respiratory infection, unspecified: Secondary | ICD-10-CM | POA: Diagnosis not present

## 2024-04-07 MED ORDER — HYDROCOD POLI-CHLORPHE POLI ER 10-8 MG/5ML PO SUER: 5.0000 mL | Freq: Every day | ORAL | 0 refills | Status: AC

## 2024-04-07 MED ORDER — MUCINEX DM MAXIMUM STRENGTH 60-1200 MG PO TB12: 1.0000 | ORAL_TABLET | Freq: Two times a day (BID) | ORAL | 0 refills | Status: AC

## 2024-04-07 MED ORDER — PROMETHAZINE-DM 6.25-15 MG/5ML PO SYRP: 10.0000 mL | ORAL_SOLUTION | Freq: Four times a day (QID) | ORAL | 0 refills | Status: AC | PRN

## 2024-04-07 MED ORDER — IPRATROPIUM-ALBUTEROL 0.5-2.5 (3) MG/3ML IN SOLN
3.0000 mL | Freq: Once | RESPIRATORY_TRACT | Status: AC
  Administered 2024-04-07: 3 mL via RESPIRATORY_TRACT

## 2024-04-07 MED ORDER — PREDNISONE 20 MG PO TABS
40.0000 mg | ORAL_TABLET | Freq: Every day | ORAL | 0 refills | Status: AC
Start: 2024-04-07 — End: 2024-04-12

## 2024-04-07 NOTE — Discharge Instructions (Addendum)
 Your symptoms are most likely caused by a respiratory infection, which affects areas like your nose, throat, or lungs. This type of infection is usually caused by a virus. Since your illness is caused by a virus, antibiotics won't help because they only treat infections caused by bacteria. Take the medications that were prescribed to you as directed. If you have a fever, headache, or body aches, you can also take Tylenol  or ibuprofen  to help you feel more comfortable. Be sure to drink plenty of fluids to stay hydrated--aim for enough to keep your urine a pale yellow color. This will also help to thin mucus and make it easier to clear from your body. Using a cool mist humidifier at home to keep humidity levels above 50% can be helpful. You can also inhale steam for 10 to 15 minutes, 3 to 4 times a day. This can be done by sitting in the bathroom with a hot shower running, or by using over-the-counter vapor shower tablets to help with nasal congestion. Try to avoid cool or dry air as much as possible. When you sleep, keep your head elevated to help reduce post-nasal drainage. Be sure to get enough rest every night to support your recovery.Don't forget to replace your toothbrush once you start feeling better.  It's normal for a cough to linger for several weeks after a respiratory illness, even after other symptoms have resolved. This happens because the airways remain irritated and take time to fully heal. As long as the cough gradually improves and there are no new concerning symptoms, this is part of the normal recovery process. If your symptoms get worse or if you develop any new or concerning symptoms, go to the emergency room right away. If you're not feeling better in a week or so, please follow up with your primary care provider.

## 2024-04-07 NOTE — ED Notes (Signed)
Pt receiving breathing tx at this time 

## 2024-04-07 NOTE — ED Provider Notes (Signed)
 [facility]    CSN: [id number] Arrival date & time: 04/07/24  1053      History   Chief Complaint Chief Complaint  Patient presents with   Cough    Cough with a lot of congestion and pressure in my chest - Entered by patient    HPI [name] is a 38 y.o. female.   Discussed the use of AI scribe software for clinical note transcription with the patient, who gave verbal consent to proceed.   Patient presents with cough and congestion for the past 3 days. Initial symptoms began 6 days ago with sneezing and a mild sore throat, which have since resolved. The patient reports both nasal and chest congestion, with a persistent dry cough that worsened last night, disrupting sleep.  The cough is described as dry and non-productive. The patient experiences chest tightness and pressure, particularly when coughing. Associated symptoms include nasal drainage and mild body aches, primarily in the chest area. The patient denies fever, chills, wheezing, or shortness of breath. They report that most symptoms have improved over the week, except for the cough, which remains bothersome. The patient has been self-medicating with an over-the-counter medication similar to Mucinex  for chest congestion, as well as previously prescribed promethazine  DM, but reports minimal relief. They do not smoke or vape.  The following sections of the patient's history were reviewed and updated as appropriate: allergies, current medications, past family history, past medical history, past social history, past surgical history, and problem list.     Past Medical History:  Diagnosis Date   Acne    Anemia    Chlamydia    HSV (herpes simplex virus) infection    Infection    UTI    Patient Active Problem List   Diagnosis Date Noted   S/P repeat low transverse C-section 09/12/2019   Alpha thalassemia silent carrier 03/21/2019   Adopted 02/28/2019   Obesity (BMI 30-39.9) 02/28/2019   H/O cesarean  section 02/20/2019   Asthma 04/12/2007    Past Surgical History:  Procedure Laterality Date   CESAREAN SECTION     CESAREAN SECTION N/A 09/12/2019   Procedure: CESAREAN SECTION;  Surgeon: Eldonna Suzen Octave, MD;  Location: [facility];  Service: Obstetrics;  Laterality: N/A;   DILATION AND CURETTAGE OF UTERUS      OB History     Gravida  6   Para  4   Term  4   Preterm  0   AB  2   Living  4      SAB  0   IAB  2   Ectopic  0   Multiple  0   Live Births  4            Home Medications    Prior to Admission medications   Medication Sig Start Date End Date Taking? Authorizing Provider  chlorpheniramine-HYDROcodone  (TUSSIONEX) 10-8 MG/5ML Take 5 mLs by mouth at bedtime. 04/07/24  Yes Iola Lukes, FNP  Dextromethorphan -guaiFENesin  (MUCINEX  DM MAXIMUM STRENGTH) 60-1200 MG TB12 Take 1 tablet by mouth 2 (two) times daily. 04/07/24  Yes Rosielee Corporan, FNP  predniSONE  (DELTASONE ) 20 MG tablet Take 2 tablets (40 mg total) by mouth daily for 5 days. 04/07/24 04/12/24 Yes Iola Lukes, FNP  promethazine -dextromethorphan  (PROMETHAZINE -DM) 6.25-15 MG/5ML syrup Take 10 mLs by mouth every 6 (six) hours as needed for cough. 04/07/24  Yes Iola Lukes, FNP  albuterol  (PROVENTIL ) (2.5 MG/3ML) 0.083% nebulizer solution Take 3 mLs (2.5 mg total) by nebulization  every 6 (six) hours as needed for up to 14 days for wheezing or shortness of breath. Patient not taking: Reported on 04/07/2024 06/24/23 07/08/23  Joesph Shaver Scales, PA-C  albuterol  (VENTOLIN  HFA) 108 [phone] Base) MCG/ACT inhaler Inhale 2 puffs into the lungs every 6 (six) hours as needed for wheezing or shortness of breath (Cough). Patient not taking: Reported on 04/07/2024 06/24/23   Joesph Shaver Scales, PA-C  clindamycin  (CLEOCIN  T) 1 % SWAB Apply to face every morning. Patient not taking: Reported on 04/07/2024 12/20/22   Alm Delon SAILOR, DO  etonogestrel -ethinyl estradiol  (NUVARING) 0.12-0.015 MG/24HR  vaginal ring Insert vaginally and leave in place for 3 consecutive weeks, then remove for 1 week. 10/17/19   Eveline Lynwood MATSU, MD  hydrocortisone  (ANUSOL -HC) 25 MG suppository Place 1 suppository (25 mg total) rectally 2 (two) times daily. Patient not taking: Reported on 04/07/2024 03/10/24   Iola Lukes, FNP  Pramoxine-HC 1-2.5 % OINT Apply a thin layer of pramoxine-HC ointment to the affected area up to 4 times daily, especially after bowel movements. Do not use more than 7 days unless. 03/10/24   Iola Lukes, FNP  senna-docusate (SENOKOT-S) 8.6-50 MG tablet Take 1 tablet by mouth at bedtime. 03/10/24   Litzi Binning, FNP  sertraline (ZOLOFT) 50 MG tablet Take 1 tablet by mouth daily. 07/15/23   [provider]  tretinoin  (RETIN-A ) 0.025 % cream Apply pea-sized amount to entire face 3 nights per week, wash off in morning. Patient not taking: Reported on 04/07/2024 02/20/24   Alm Delon SAILOR, DO    Family History Family History  Adopted: Yes  Problem Relation Age of Onset   Other Neg Hx     Social History Social History   Tobacco Use   Smoking status: Never   Smokeless tobacco: Never  Vaping Use   Vaping status: Never Used  Substance Use Topics   Alcohol use: No   Drug use: No     Allergies   Patient has no known allergies.   Review of Systems Review of Systems  Constitutional:  Negative for chills and fever.  HENT:  Positive for congestion, postnasal drip, rhinorrhea, sneezing and sore throat (mildly initially but has since resolved).   Respiratory:  Positive for cough (dry, nonproductive). Negative for chest tightness, shortness of breath and wheezing.   Musculoskeletal:  Negative for myalgias.  Psychiatric/Behavioral:  Positive for sleep disturbance (due to the cough).   All other systems reviewed and are negative.    Physical Exam Triage Vital Signs ED Triage Vitals  Encounter Vitals Group     BP 04/07/24 1128 107/73     Girls Systolic BP  Percentile --      Girls Diastolic BP Percentile --      Boys Systolic BP Percentile --      Boys Diastolic BP Percentile --      Pulse Rate 04/07/24 1128 88     Resp 04/07/24 1128 18     Temp 04/07/24 1128 98.8 F (37.1 C)     Temp Source 04/07/24 1128 Oral     SpO2 04/07/24 1128 98 %     Weight --      Height --      Head Circumference --      Peak Flow --      Pain Score 04/07/24 1129 0     Pain Loc --      Pain Education --      Exclude from Growth Chart --  No data found.  Updated Vital Signs BP 107/73 (BP Location: Left Arm)   Pulse 88   Temp 98.8 F (37.1 C) (Oral)   Resp 18   SpO2 98%   Visual Acuity Right Eye Distance:   Left Eye Distance:   Bilateral Distance:    Right Eye Near:   Left Eye Near:    Bilateral Near:     Physical Exam Vitals reviewed.  Constitutional:      General: She is awake. She is not in acute distress.    Appearance: Normal appearance. She is well-developed. She is not ill-appearing, toxic-appearing or diaphoretic.  HENT:     Head: Normocephalic.     Right Ear: Tympanic membrane, ear canal and external ear normal. No drainage, swelling or tenderness. No middle ear effusion. Tympanic membrane is not erythematous.     Left Ear: Tympanic membrane, ear canal and external ear normal. No drainage, swelling or tenderness.  No middle ear effusion. Tympanic membrane is not erythematous.     Nose: Congestion present. No rhinorrhea.     Mouth/Throat:     Lips: Pink.     Mouth: Mucous membranes are moist.     Pharynx: No pharyngeal swelling, oropharyngeal exudate, posterior oropharyngeal erythema or uvula swelling.     Tonsils: No tonsillar exudate or tonsillar abscesses.  Eyes:     General: Vision grossly intact.     Conjunctiva/sclera: Conjunctivae normal.  Cardiovascular:     Rate and Rhythm: Normal rate.     Heart sounds: Normal heart sounds.  Pulmonary:     Effort: Pulmonary effort is normal. No tachypnea or respiratory distress.      Breath sounds: Normal breath sounds and air entry.     Comments: Respirations even and unlabored  Musculoskeletal:        General: Normal range of motion.     Cervical back: Normal range of motion and neck supple.  Lymphadenopathy:     Cervical: No cervical adenopathy.  Skin:    General: Skin is warm and dry.  Neurological:     General: No focal deficit present.     Mental Status: She is alert and oriented to person, place, and time.  Psychiatric:        Behavior: Behavior is cooperative.      UC Treatments / Results  Labs (all labs ordered are listed, but only abnormal results are displayed) Labs Reviewed - No data to display  EKG   Radiology No results found.  Procedures Procedures (including critical care time)  Medications Ordered in UC Medications  ipratropium-albuterol  (DUONEB) 0.5-2.5 (3) MG/3ML nebulizer solution 3 mL (3 mLs Nebulization Given 04/07/24 1221)    Initial Impression / Assessment and Plan / UC Course  I have reviewed the triage vital signs and the nursing notes.  Pertinent labs & imaging results that were available during my care of the patient were reviewed by me and considered in my medical decision making (see chart for details).     The patient presents with symptoms consistent with a viral upper respiratory infection. Exam is reassuring and no evidence of bacterial infection or acute cardiopulmonary process is noted. DuoNeb given in clinic to treat subjective chest discomfort and chest congestion. Supportive care is recommended. Patient was advised to follow up with primary care if symptoms do not improve within one week or if new concerns arise. Instructions were given to seek emergency care if symptoms worsen, including shortness of breath, chest pain, persistent high fever, inability to  tolerate fluids, or confusion.  Today's evaluation has revealed no signs of a dangerous process. Discussed diagnosis with patient and/or guardian. Patient  and/or guardian aware of their diagnosis, possible red flag symptoms to watch out for and need for close follow up. Patient and/or guardian understands verbal and written discharge instructions. Patient and/or guardian comfortable with plan and disposition.  Patient and/or guardian has a clear mental status at this time, good insight into illness (after discussion and teaching) and has clear judgment to make decisions regarding their care  Documentation was completed with the aid of voice recognition software. Transcription may contain typographical errors.   Final Clinical Impressions(s) / UC Diagnoses   Final diagnoses:  Viral upper respiratory tract infection     Discharge Instructions      Your symptoms are most likely caused by a respiratory infection, which affects areas like your nose, throat, or lungs. This type of infection is usually caused by a virus. Since your illness is caused by a virus, antibiotics won't help because they only treat infections caused by bacteria. Take the medications that were prescribed to you as directed. If you have a fever, headache, or body aches, you can also take Tylenol  or ibuprofen  to help you feel more comfortable. Be sure to drink plenty of fluids to stay hydrated--aim for enough to keep your urine a pale yellow color. This will also help to thin mucus and make it easier to clear from your body. Using a cool mist humidifier at home to keep humidity levels above 50% can be helpful. You can also inhale steam for 10 to 15 minutes, 3 to 4 times a day. This can be done by sitting in the bathroom with a hot shower running, or by using over-the-counter vapor shower tablets to help with nasal congestion. Try to avoid cool or dry air as much as possible. When you sleep, keep your head elevated to help reduce post-nasal drainage. Be sure to get enough rest every night to support your recovery.Don't forget to replace your toothbrush once you start feeling better.  It's  normal for a cough to linger for several weeks after a respiratory illness, even after other symptoms have resolved. This happens because the airways remain irritated and take time to fully heal. As long as the cough gradually improves and there are no new concerning symptoms, this is part of the normal recovery process. If your symptoms get worse or if you develop any new or concerning symptoms, go to the emergency room right away. If you're not feeling better in a week or so, please follow up with your primary care provider.            ED Prescriptions     Medication Sig Dispense Auth. Provider   Dextromethorphan -guaiFENesin  (MUCINEX  DM MAXIMUM STRENGTH) 60-1200 MG TB12 Take 1 tablet by mouth 2 (two) times daily. 20 tablet Iola Lukes, FNP   predniSONE  (DELTASONE ) 20 MG tablet Take 2 tablets (40 mg total) by mouth daily for 5 days. 10 tablet Iola Lukes, FNP   chlorpheniramine-HYDROcodone  (TUSSIONEX) 10-8 MG/5ML Take 5 mLs by mouth at bedtime. 35 mL Iola Lukes, FNP   promethazine -dextromethorphan  (PROMETHAZINE -DM) 6.25-15 MG/5ML syrup Take 10 mLs by mouth every 6 (six) hours as needed for cough. 118 mL Iola Lukes, FNP      I have reviewed the PDMP during this encounter.   Iola Lukes, [place] 04/07/24 1308

## 2024-04-07 NOTE — ED Triage Notes (Signed)
 Pt c/o nonproductive cough and chest congestion x's 3 days   St's has tried using Mucinex  without relief

## 2024-04-13 ENCOUNTER — Institutional Professional Consult (permissible substitution): Admitting: Plastic Surgery

## 2024-04-25 ENCOUNTER — Institutional Professional Consult (permissible substitution)

## 2024-05-08 ENCOUNTER — Institutional Professional Consult (permissible substitution)

## 2024-05-22 ENCOUNTER — Ambulatory Visit
Admission: EM | Admit: 2024-05-22 | Discharge: 2024-05-22 | Disposition: A | Discharge status: Home / Self Care | Attending: Student | Admitting: Student

## 2024-05-22 ENCOUNTER — Encounter: Payer: Self-pay | Admitting: Emergency Medicine

## 2024-05-22 DIAGNOSIS — Z8619 Personal history of other infectious and parasitic diseases: Secondary | ICD-10-CM | POA: Insufficient documentation

## 2024-05-22 DIAGNOSIS — Z87440 Personal history of urinary (tract) infections: Secondary | ICD-10-CM | POA: Diagnosis not present

## 2024-05-22 DIAGNOSIS — Z3202 Encounter for pregnancy test, result negative: Secondary | ICD-10-CM | POA: Insufficient documentation

## 2024-05-22 DIAGNOSIS — R399 Unspecified symptoms and signs involving the genitourinary system: Secondary | ICD-10-CM | POA: Insufficient documentation

## 2024-05-22 DIAGNOSIS — N760 Acute vaginitis: Secondary | ICD-10-CM | POA: Insufficient documentation

## 2024-05-22 LAB — POCT URINE DIPSTICK
Bilirubin, UA: NEGATIVE
Glucose, UA: NEGATIVE mg/dL
Ketones, POC UA: NEGATIVE mg/dL
Nitrite, UA: NEGATIVE
POC PROTEIN,UA: NEGATIVE
Spec Grav, UA: 1.015 (ref 1.010–1.025)
Urobilinogen, UA: 1 U/dL
pH, UA: 7 (ref 5.0–8.0)

## 2024-05-22 LAB — POCT URINE PREGNANCY: Preg Test, Ur: NEGATIVE

## 2024-05-22 MED ORDER — FLUCONAZOLE 150 MG PO TABS: 150.0000 mg | ORAL_TABLET | Freq: Every day | ORAL | 0 refills | Status: AC

## 2024-05-22 NOTE — ED Triage Notes (Signed)
 Pt presents c/o Possible UTI  x 3 days. Pt states,  I have vaginal irritation. It itches a lot. It doesn't burn or anything when I pee but when I use the bathroom  I feel a little pressure after I urinate. I'm also peeing often. There's no odor or nothing.  Pt denies any additional sxs.

## 2024-05-22 NOTE — Discharge Instructions (Signed)
-   Your urine was fairly normal.  It showed a small amount of bacteria, and a small amount of blood. -We are treating for a yeast infection based on your symptoms. -For your yeast infection, start the Diflucan  (fluconazole )- Take one pill today (day 1). If you're still having symptoms in 3 days, take the second pill.  - We are testing for BV, yeast, gonorrhea, chlamydia, trichomonas,  -We will call with any positive results in about 3 business days and can send treatment if necessary. -We will not call with negative lab results. -Lab results will automatically go to your MyChart.

## 2024-05-22 NOTE — ED Provider Notes (Signed)
 [facility]    CSN: [id number] Arrival date & time: 05/22/24  1510      History   Chief Complaint Chief Complaint  Patient presents with   Vaginal Itching    HPI [name] is a 38 y.o. female presenting with vaginal discomfort. H/o chlamydia, HSV, UTI. Pt presents c/o Possible UTI  x 3 days. Pt states,  I have vaginal irritation. It itches a lot. It doesn't burn or anything when I pee but when I use the bathroom  I feel a little pressure after I urinate. I'm also peeing often. There's no odor or nothing.  Describes the discharge as clumpy. Pt denies any additional sxs. No recent abx. She is not a diabetic.  HPI  Past Medical History:  Diagnosis Date   Acne    Anemia    Chlamydia    HSV (herpes simplex virus) infection    Infection    UTI    Patient Active Problem List   Diagnosis Date Noted   S/P repeat low transverse C-section 09/12/2019   Alpha thalassemia silent carrier 03/21/2019   Adopted 02/28/2019   Obesity (BMI 30-39.9) 02/28/2019   H/O cesarean section 02/20/2019   Asthma 04/12/2007    Past Surgical History:  Procedure Laterality Date   CESAREAN SECTION     CESAREAN SECTION N/A 09/12/2019   Procedure: CESAREAN SECTION;  Surgeon: Eldonna Suzen Octave, MD;  Location: [facility]S;  Service: Obstetrics;  Laterality: N/A;   DILATION AND CURETTAGE OF UTERUS      OB History     Gravida  6   Para  4   Term  4   Preterm  0   AB  2   Living  4      SAB  0   IAB  2   Ectopic  0   Multiple  0   Live Births  4            Home Medications    Prior to Admission medications   Medication Sig Start Date End Date Taking? Authorizing Provider  clotrimazole-betamethasone (LOTRISONE) cream Apply topically. 01/23/24  Yes [provider]  fluconazole  (DIFLUCAN ) 150 MG tablet Take 1 tablet (150 mg total) by mouth daily. -For your yeast infection, start the Diflucan  (fluconazole )- Take one pill today (day 1). If you're  still having symptoms in 3 days, take the second pill. 05/22/24  Yes Ceira Hoeschen E, PA-C  hydrOXYzine  (ATARAX ) 10 MG tablet Take 10 mg by mouth. 11/16/23  Yes [provider]  minocycline (MINOCIN) 100 MG capsule Take 100 mg by mouth. 10/11/19  Yes [provider]  polyethylene glycol (MIRALAX / GLYCOLAX) 17 g packet Take 17 g by mouth. 04/04/24  Yes [provider]  WEGOVY 1.7 MG/0.75ML SOAJ SQ injection SMARTSIG:1 pre-filled pen syringe Once a Week 11/30/23  Yes [provider]  chlorpheniramine-HYDROcodone  (TUSSIONEX) 10-8 MG/5ML Take 5 mLs by mouth at bedtime. 04/07/24   Iola Lukes, FNP  Dextromethorphan -guaiFENesin  (MUCINEX  DM MAXIMUM STRENGTH) 60-1200 MG TB12 Take 1 tablet by mouth 2 (two) times daily. 04/07/24   Iola Lukes, FNP  etonogestrel -ethinyl estradiol  (NUVARING) 0.12-0.015 MG/24HR vaginal ring Insert vaginally and leave in place for 3 consecutive weeks, then remove for 1 week. 10/17/19   Arnold, James G, MD  phentermine (ADIPEX-P) 37.5 MG tablet Take 37.5 mg by mouth every morning.    [provider]  Pramoxine-HC 1-2.5 % OINT Apply a thin layer of pramoxine-HC ointment to the affected area  up to 4 times daily, especially after bowel movements. Do not use more than 7 days unless. 03/10/24   Iola Lukes, FNP  promethazine -dextromethorphan  (PROMETHAZINE -DM) 6.25-15 MG/5ML syrup Take 10 mLs by mouth every 6 (six) hours as needed for cough. 04/07/24   Iola Lukes, FNP  senna-docusate (SENOKOT-S) 8.6-50 MG tablet Take 1 tablet by mouth at bedtime. 03/10/24   Murrill, Samantha, FNP  sertraline (ZOLOFT) 50 MG tablet Take 1 tablet by mouth daily. 07/15/23   [provider]    Family History Family History  Adopted: Yes  Problem Relation Age of Onset   Other Neg Hx     Social History Social History   Tobacco Use   Smoking status: Never    Passive exposure: Never   Smokeless tobacco: Never  Vaping Use   Vaping  status: Never Used  Substance Use Topics   Alcohol use: No   Drug use: No     Allergies   Patient has no known allergies.   Review of Systems Review of Systems  Constitutional:  Negative for appetite change, chills, diaphoresis and fever.  Respiratory:  Negative for shortness of breath.   Cardiovascular:  Negative for chest pain.  Gastrointestinal:  Negative for abdominal pain, blood in stool, constipation, diarrhea, nausea and vomiting.  Genitourinary:  Positive for frequency and vaginal discharge. Negative for decreased urine volume, difficulty urinating, dysuria, flank pain, genital sores, hematuria and urgency.  Musculoskeletal:  Negative for back pain.  Neurological:  Negative for dizziness, weakness and light-headedness.  All other systems reviewed and are negative.    Physical Exam Triage Vital Signs ED Triage Vitals  Encounter Vitals Group     BP      Girls Systolic BP Percentile      Girls Diastolic BP Percentile      Boys Systolic BP Percentile      Boys Diastolic BP Percentile      Pulse      Resp      Temp      Temp src      SpO2      Weight      Height      Head Circumference      Peak Flow      Pain Score      Pain Loc      Pain Education      Exclude from Growth Chart    No data found.  Updated Vital Signs BP 118/76 (BP Location: Left Arm)   Pulse 77   Temp 97.8 F (36.6 C) (Oral)   Resp 16   Wt 175 lb (79.4 kg)   LMP 05/09/2024 (Approximate)   SpO2 98%   BMI 30.04 kg/m   Visual Acuity Right Eye Distance:   Left Eye Distance:   Bilateral Distance:    Right Eye Near:   Left Eye Near:    Bilateral Near:     Physical Exam Vitals reviewed.  Constitutional:      General: She is not in acute distress.    Appearance: Normal appearance. She is not ill-appearing.  HENT:     Head: Normocephalic and atraumatic.     Mouth/Throat:     Mouth: Mucous membranes are moist.     Comments: Moist mucous membranes Eyes:     Extraocular  Movements: Extraocular movements intact.     Pupils: Pupils are equal, round, and reactive to light.  Cardiovascular:     Rate and Rhythm: Normal rate and regular rhythm.  Heart sounds: Normal heart sounds.  Pulmonary:     Effort: Pulmonary effort is normal.     Breath sounds: Normal breath sounds. No wheezing, rhonchi or rales.  Abdominal:     General: Bowel sounds are normal. There is no distension.     Palpations: Abdomen is soft. There is no mass.     Tenderness: There is no abdominal tenderness. There is no right CVA tenderness, left CVA tenderness, guarding or rebound.  Skin:    General: Skin is warm.     Capillary Refill: Capillary refill takes less than 2 seconds.     Comments: Good skin turgor  Neurological:     General: No focal deficit present.     Mental Status: She is alert and oriented to person, place, and time.  Psychiatric:        Mood and Affect: Mood normal.        Behavior: Behavior normal.      UC Treatments / Results  Labs (all labs ordered are listed, but only abnormal results are displayed) Labs Reviewed  POCT URINE DIPSTICK - Abnormal; Notable for the following components:      Result Value   Blood, UA trace-intact (*)    Leukocytes, UA Small (1+) (*)    All other components within normal limits  POCT URINE PREGNANCY - Normal  URINE CULTURE  CERVICOVAGINAL ANCILLARY ONLY    EKG   Radiology No results found.  Procedures Procedures (including critical care time)  Medications Ordered in UC Medications - No data to display  Initial Impression / Assessment and Plan / UC Course  I have reviewed the triage vital signs and the nursing notes.  Pertinent labs & imaging results that were available during my care of the patient were reviewed by me and considered in my medical decision making (see chart for details).     Patient is a pleasant 38 year old female presenting with suspected vaginal candidiasis. The patient is afebrile and  nontachycardic.  Antipyretic has not been administered today.  UA with trace blood, small leuk.  Culture sent. Urine pregnancy negative. Cervicovaginal swab self collected.  Will treat empirically with Diflucan  while awaiting cervicovaginal swab results.  Final Clinical Impressions(s) / UC Diagnoses   Final diagnoses:  Urinary symptom or sign  Vaginitis and vulvovaginitis     Discharge Instructions      - Your urine was fairly normal.  It showed a small amount of bacteria, and a small amount of blood. -We are treating for a yeast infection based on your symptoms. -For your yeast infection, start the Diflucan  (fluconazole )- Take one pill today (day 1). If you're still having symptoms in 3 days, take the second pill.  - We are testing for BV, yeast, gonorrhea, chlamydia, trichomonas,  -We will call with any positive results in about 3 business days and can send treatment if necessary. -We will not call with negative lab results. -Lab results will automatically go to your MyChart.       ED Prescriptions     Medication Sig Dispense Auth. Provider   fluconazole  (DIFLUCAN ) 150 MG tablet Take 1 tablet (150 mg total) by mouth daily. -For your yeast infection, start the Diflucan  (fluconazole )- Take one pill today (day 1). If you're still having symptoms in 3 days, take the second pill. 2 tablet Saffron Busey E, PA-C      PDMP not reviewed this encounter.   Arlyss Leita BRAVO, PA-C 05/22/24 [phone]

## 2024-05-23 ENCOUNTER — Ambulatory Visit (HOSPITAL_COMMUNITY): Payer: Self-pay

## 2024-05-23 LAB — CERVICOVAGINAL ANCILLARY ONLY
Bacterial Vaginitis (gardnerella): NEGATIVE
Candida Glabrata: NEGATIVE
Candida Vaginitis: POSITIVE — AB
Chlamydia: NEGATIVE
Comment: NEGATIVE
Comment: NEGATIVE
Comment: NEGATIVE
Comment: NEGATIVE
Comment: NEGATIVE
Comment: NORMAL
Neisseria Gonorrhea: NEGATIVE
Trichomonas: NEGATIVE

## 2024-05-23 LAB — URINE CULTURE: Culture: NO GROWTH

## 2024-07-24 ENCOUNTER — Encounter: Payer: Self-pay | Admitting: Dermatology

## 2024-07-24 ENCOUNTER — Ambulatory Visit: Admitting: Dermatology

## 2024-07-24 DIAGNOSIS — L810 Postinflammatory hyperpigmentation: Secondary | ICD-10-CM

## 2024-07-24 DIAGNOSIS — L700 Acne vulgaris: Secondary | ICD-10-CM

## 2024-07-24 MED ORDER — RETIN-A 0.025 % EX CREA: TOPICAL_CREAM | Freq: Every day | CUTANEOUS | 5 refills | Status: AC

## 2024-07-24 NOTE — Patient Instructions (Addendum)
 VISIT SUMMARY:  [name], you visited today with concerns about your skin texture and dark spots, which you attribute to acne breakouts. You experience large acne breakouts approximately every other month and have been using Tretinoin  occasionally. You have tried various skincare routines without significant improvement and are currently using CeraVe products.  YOUR PLAN:  -ACNE VULGARIS:  Acne vulgaris is a common skin condition that causes pimples and can lead to scarring.   You will use a medicated wash with salicylic acid in the morning to exfoliate and prevent blackheads, and a regular wash at night. Continue using Tretinoin  0.025% at night, two nights a week, and increase to three nights a week in March.   You were provided with samples of Cerave salicylic acid wash and Cerave moisturizer.  -POSTINFLAMMATORY HYPERPIGMENTATION:  Postinflammatory hyperpigmentation refers to dark spots that appear after acne lesions heal. To help improve your skin texture and reduce scarring, continue the Tretinoin  regimen as outlined for acne vulgaris, which will aid in skin cell turnover and collagen stimulation.  INSTRUCTIONS:  Please follow the outlined skincare regimen and increase the frequency of Tretinoin  use as tolerated. Use the medicated wash with salicylic acid in the morning and a regular wash at night. Continue using the provided samples of Cerave salicylic acid wash and Cerave moisturizer. Increase Tretinoin  use to three nights a week starting in March. If you have any concerns or notice any adverse reactions, please contact our office.    Important Information  Due to recent changes in healthcare laws, you may see results of your pathology and/or laboratory studies on MyChart before the doctors have had a chance to review them. We understand that in some cases there may be results that are confusing or concerning to you. Please understand that not all results are received at the same  time and often the doctors may need to interpret multiple results in order to provide you with the best plan of care or course of treatment. Therefore, we ask that you please give us  2 business days to thoroughly review all your results before contacting the office for clarification. Should we see a critical lab result, you will be contacted sooner.   If You Need Anything After Your Visit  If you have any questions or concerns for your doctor, please call our main line at [phone] If no one answers, please leave a voicemail as directed and we will return your call as soon as possible. Messages left after 4 pm will be answered the following business day.   You may also send us  a message via MyChart. We typically respond to MyChart messages within 1-2 business days.  For prescription refills, please ask your pharmacy to contact our office. Our fax number is [phone].  If you have an urgent issue when the clinic is closed that cannot wait until the next business day, you can page your doctor at the number below.    Please note that while we do our best to be available for urgent issues outside of office hours, we are not available 24/7.   If you have an urgent issue and are unable to reach us , you may choose to seek medical care at your doctor's office, retail clinic, urgent care center, or emergency room.  If you have a medical emergency, please immediately call 911 or go to the emergency department. In the event of inclement weather, please call our main line at [phone] for an update on the status of any delays or  closures.  Dermatology Medication Tips: Please keep the boxes that topical medications come in in order to help keep track of the instructions about where and how to use these. Pharmacies typically print the medication instructions only on the boxes and not directly on the medication tubes.   If your medication is too expensive, please contact our office at [phone]  or send us  a message through MyChart.   We are unable to tell what your co-pay for medications will be in advance as this is different depending on your insurance coverage. However, we may be able to find a substitute medication at lower cost or fill out paperwork to get insurance to cover a needed medication.   If a prior authorization is required to get your medication covered by your insurance company, please allow us  1-2 business days to complete this process.  Drug prices often vary depending on where the prescription is filled and some pharmacies may offer cheaper prices.  The website www.goodrx.com contains coupons for medications through different pharmacies. The prices here do not account for what the cost may be with help from insurance (it may be cheaper with your insurance), but the website can give you the price if you did not use any insurance.  - You can print the associated coupon and take it with your prescription to the pharmacy.  - You may also stop by our office during regular business hours and pick up a GoodRx coupon card.  - If you need your prescription sent electronically to a different pharmacy, notify our office through [facility] or by phone at [phone]

## 2024-07-24 NOTE — Progress Notes (Signed)
" ° °  Follow-Up Visit  Patient (and/or pt guardian) consented to the use of AI-assisted tools for note generation.    Subjective  [name] is a 39 y.o. female who presents for the following: Acne and PIH  Patient was last evaluated on 12/20/22.  At this visit patient was prescribed Doxycycline  100 mg daily, Tretinoin  0.025% to use 3 nights a week and clindamycin  swabs daily.  Discussed a topical lightening agent for PIH once skin is acclimated to retinoids  Recommended use of SPF 30 or more daily  Patient reports she is no longer using the Clindamycin  Swabs or Doxycyline. She is currently applying Tretinoin  2 nights weekly. She reports she is currently washing with Cream to foam wash.  Patient reports sxs are unchanged.  Patient denies medication changes.  Patient provided verbal consent for the use of an AI-assisted program to generate a detailed after-visit summary. The patient understands that the AI tool is used to support clinical documentation and that all information will be reviewed and verified by the healthcare provider.  Patient reports she is not actively pregnant, trying to conceive or nursing.  The following portions of the chart were reviewed this encounter and updated as appropriate: medications, allergies, medical history  Review of Systems:  No other skin or systemic complaints except as noted in HPI or Assessment and Plan.  Objective  Well appearing patient in no apparent distress; mood and affect are within normal limits.  A focused examination was performed of the following areas: Face  Relevant exam findings are noted in the Assessment and Plan.          Assessment & Plan    Acne vulgaris Intermittent acne vulgaris with large breakouts occurring every other month. Previously managed with doxycycline , which reduced large breakouts. Prefers topical treatment due to concurrent oral medications. Tretinoin  is used occasionally, with plans to increase  frequency as tolerated. - Prescribed medicated wash with salicylic acid for morning use to exfoliate and prevent blackheads. - Instructed to use regular wash at night. - Continue tretinoin  0.025% at night, two nights a week, increasing to three nights a week in March. - Provided samples of Cerave salicylic acid wash. - Provided samples of Cerave moisturizer.  Postinflammatory hyperpigmentation Dark spots secondary to acne lesions. Goal is to control acne to prevent further hyperpigmentation. Tretinoin  will aid in skin cell turnover and collagen stimulation to improve skin texture and reduce scarring. - Continue tretinoin  regimen as outlined for acne vulgaris to aid in skin cell turnover and collagen stimulation.   ACNE VULGARIS   This Visit - RETIN-A  0.025 % cream - Apply topically at bedtime. Apply 2 nights weekly until March, If your skin tolerates increase to 3 nights weekly starting in March. Decrease usage if you experience excessive dryness to 1-2 nights POST-INFLAMMATORY HYPERPIGMENTATION   This Visit - RETIN-A  0.025 % cream - Apply topically at bedtime. Apply 2 nights weekly until March, If your skin tolerates increase to 3 nights weekly starting in March. Decrease usage if you experience excessive dryness to 1-2 nights  Return in about 4 months (around 11/21/2024) for Acne F/U.  I, Jetta Ager, am acting as neurosurgeon for [employer], DO.  Documentation: I have reviewed the above documentation for accuracy and completeness, and I agree with the above.  Delon Lenis, DO   "

## 2024-07-30 ENCOUNTER — Other Ambulatory Visit: Payer: Self-pay

## 2024-07-30 MED ORDER — TRETINOIN 0.025 % EX CREA: TOPICAL_CREAM | Freq: Every day | CUTANEOUS | 5 refills | Status: DC

## 2024-07-30 NOTE — Progress Notes (Signed)
 Pt requested refill of retin a as generic

## 2024-08-06 ENCOUNTER — Other Ambulatory Visit: Payer: Self-pay

## 2024-08-06 MED ORDER — TRETINOIN 0.025 % EX CREA: TOPICAL_CREAM | Freq: Every day | CUTANEOUS | 5 refills | Status: AC

## 2024-08-06 NOTE — Progress Notes (Signed)
 Pharmacy said that auth was not done so I resent rx with copy of auth

## 2024-08-08 ENCOUNTER — Institutional Professional Consult (permissible substitution)

## 2024-08-29 ENCOUNTER — Institutional Professional Consult (permissible substitution)

## 2024-12-05 ENCOUNTER — Ambulatory Visit: Admitting: Dermatology
